# Patient Record
Sex: Female | Born: 1940 | Race: Asian | Hispanic: No | Marital: Married | Smoking: Never smoker
Health system: Southern US, Community
[De-identification: ages and names within clinical notes are randomized; demographics above are authoritative.]

## PROBLEM LIST (undated history)

## (undated) DIAGNOSIS — K250 Acute gastric ulcer with hemorrhage: Secondary | ICD-10-CM

## (undated) DIAGNOSIS — I480 Paroxysmal atrial fibrillation: Secondary | ICD-10-CM

## (undated) DIAGNOSIS — I214 Non-ST elevation (NSTEMI) myocardial infarction: Secondary | ICD-10-CM

---

## 2009-11-09 DIAGNOSIS — I214 Non-ST elevation (NSTEMI) myocardial infarction: Secondary | ICD-10-CM

## 2009-11-09 HISTORY — PX: ERCP: SHX60

## 2009-11-09 HISTORY — PX: PARTIAL COLECTOMY: SHX5273

## 2009-11-09 HISTORY — PX: COLOSTOMY: SHX63

## 2009-11-09 HISTORY — PX: CARDIAC CATHETERIZATION: SHX172

## 2009-11-09 HISTORY — DX: Non-ST elevation (NSTEMI) myocardial infarction: I21.4

## 2009-11-09 HISTORY — PX: LAPAROSCOPIC CHOLECYSTECTOMY: SUR755

## 2010-06-23 ENCOUNTER — Emergency Department (HOSPITAL_COMMUNITY): Admission: EM | Admit: 2010-06-23 | Discharge: 2010-06-23 | Payer: Self-pay | Admitting: Emergency Medicine

## 2010-06-23 ENCOUNTER — Ambulatory Visit: Payer: Self-pay | Admitting: Internal Medicine

## 2010-06-23 ENCOUNTER — Inpatient Hospital Stay (HOSPITAL_COMMUNITY): Admission: EM | Admit: 2010-06-23 | Discharge: 2010-06-30 | Payer: Self-pay | Admitting: Emergency Medicine

## 2010-06-25 ENCOUNTER — Encounter (INDEPENDENT_AMBULATORY_CARE_PROVIDER_SITE_OTHER): Payer: Self-pay | Admitting: Internal Medicine

## 2010-07-03 ENCOUNTER — Telehealth (INDEPENDENT_AMBULATORY_CARE_PROVIDER_SITE_OTHER): Payer: Self-pay | Admitting: *Deleted

## 2010-07-04 ENCOUNTER — Encounter: Payer: Self-pay | Admitting: Internal Medicine

## 2010-07-09 ENCOUNTER — Encounter: Payer: Self-pay | Admitting: Internal Medicine

## 2010-07-15 DIAGNOSIS — J45909 Unspecified asthma, uncomplicated: Secondary | ICD-10-CM | POA: Insufficient documentation

## 2010-07-16 ENCOUNTER — Encounter: Payer: Self-pay | Admitting: Internal Medicine

## 2010-07-17 ENCOUNTER — Ambulatory Visit: Payer: Self-pay | Admitting: Internal Medicine

## 2010-07-17 ENCOUNTER — Inpatient Hospital Stay (HOSPITAL_COMMUNITY): Admission: AD | Admit: 2010-07-17 | Discharge: 2010-07-21 | Payer: Self-pay | Admitting: Internal Medicine

## 2010-07-17 ENCOUNTER — Encounter: Payer: Self-pay | Admitting: Physician Assistant

## 2010-07-17 DIAGNOSIS — N183 Chronic kidney disease, stage 3 (moderate): Secondary | ICD-10-CM

## 2010-07-17 DIAGNOSIS — R0602 Shortness of breath: Secondary | ICD-10-CM

## 2010-07-17 DIAGNOSIS — I2510 Atherosclerotic heart disease of native coronary artery without angina pectoris: Secondary | ICD-10-CM

## 2010-07-17 DIAGNOSIS — E785 Hyperlipidemia, unspecified: Secondary | ICD-10-CM

## 2010-07-17 DIAGNOSIS — I4891 Unspecified atrial fibrillation: Secondary | ICD-10-CM

## 2010-07-22 ENCOUNTER — Telehealth: Payer: Self-pay | Admitting: Internal Medicine

## 2010-07-23 ENCOUNTER — Ambulatory Visit: Payer: Self-pay | Admitting: Physician Assistant

## 2010-07-23 DIAGNOSIS — I2589 Other forms of chronic ischemic heart disease: Secondary | ICD-10-CM

## 2010-07-23 DIAGNOSIS — R0609 Other forms of dyspnea: Secondary | ICD-10-CM

## 2010-07-23 DIAGNOSIS — D649 Anemia, unspecified: Secondary | ICD-10-CM | POA: Insufficient documentation

## 2010-07-23 DIAGNOSIS — R0989 Other specified symptoms and signs involving the circulatory and respiratory systems: Secondary | ICD-10-CM

## 2010-07-23 DIAGNOSIS — J189 Pneumonia, unspecified organism: Secondary | ICD-10-CM

## 2010-07-23 DIAGNOSIS — I10 Essential (primary) hypertension: Secondary | ICD-10-CM | POA: Insufficient documentation

## 2010-07-24 LAB — CONVERTED CEMR LAB
Basophils Relative: 0 % (ref 0–1)
Hemoglobin: 12.1 g/dL (ref 12.0–15.0)
Lymphocytes Relative: 28 % (ref 12–46)
MCHC: 31.5 g/dL (ref 30.0–36.0)
Monocytes Absolute: 0.5 10*3/uL (ref 0.1–1.0)
Monocytes Relative: 8 % (ref 3–12)
Neutro Abs: 4 10*3/uL (ref 1.7–7.7)
RBC: 4.33 M/uL (ref 3.87–5.11)
TIBC: 244 ug/dL — ABNORMAL LOW (ref 250–470)

## 2010-07-25 ENCOUNTER — Encounter (INDEPENDENT_AMBULATORY_CARE_PROVIDER_SITE_OTHER): Payer: Self-pay | Admitting: *Deleted

## 2010-07-30 ENCOUNTER — Encounter: Payer: Self-pay | Admitting: Internal Medicine

## 2010-09-13 ENCOUNTER — Inpatient Hospital Stay (HOSPITAL_COMMUNITY)
Admission: EM | Admit: 2010-09-13 | Discharge: 2010-12-23 | DRG: 411 | Disposition: A | Discharge status: Skilled Nursing Facility | Payer: Medicaid Other | Attending: Emergency Medicine | Admitting: Emergency Medicine

## 2010-09-13 DIAGNOSIS — R578 Other shock: Secondary | ICD-10-CM | POA: Diagnosis not present

## 2010-09-13 DIAGNOSIS — I4891 Unspecified atrial fibrillation: Secondary | ICD-10-CM | POA: Diagnosis present

## 2010-09-13 DIAGNOSIS — K921 Melena: Secondary | ICD-10-CM | POA: Diagnosis not present

## 2010-09-13 DIAGNOSIS — I10 Essential (primary) hypertension: Secondary | ICD-10-CM | POA: Diagnosis present

## 2010-09-13 DIAGNOSIS — K8050 Calculus of bile duct without cholangitis or cholecystitis without obstruction: Principal | ICD-10-CM | POA: Diagnosis present

## 2010-09-13 DIAGNOSIS — K631 Perforation of intestine (nontraumatic): Secondary | ICD-10-CM | POA: Diagnosis not present

## 2010-09-13 DIAGNOSIS — A419 Sepsis, unspecified organism: Secondary | ICD-10-CM | POA: Diagnosis not present

## 2010-09-13 DIAGNOSIS — Y9210 Unspecified residential institution as the place of occurrence of the external cause: Secondary | ICD-10-CM | POA: Diagnosis not present

## 2010-09-13 DIAGNOSIS — R197 Diarrhea, unspecified: Secondary | ICD-10-CM | POA: Diagnosis present

## 2010-09-13 DIAGNOSIS — E876 Hypokalemia: Secondary | ICD-10-CM | POA: Diagnosis present

## 2010-09-13 DIAGNOSIS — E43 Unspecified severe protein-calorie malnutrition: Secondary | ICD-10-CM | POA: Diagnosis not present

## 2010-09-13 DIAGNOSIS — D62 Acute posthemorrhagic anemia: Secondary | ICD-10-CM | POA: Diagnosis not present

## 2010-09-13 DIAGNOSIS — I252 Old myocardial infarction: Secondary | ICD-10-CM

## 2010-09-13 DIAGNOSIS — J9600 Acute respiratory failure, unspecified whether with hypoxia or hypercapnia: Secondary | ICD-10-CM | POA: Diagnosis not present

## 2010-09-13 DIAGNOSIS — J441 Chronic obstructive pulmonary disease with (acute) exacerbation: Secondary | ICD-10-CM | POA: Diagnosis present

## 2010-09-13 DIAGNOSIS — Z7902 Long term (current) use of antithrombotics/antiplatelets: Secondary | ICD-10-CM

## 2010-09-13 DIAGNOSIS — E871 Hypo-osmolality and hyponatremia: Secondary | ICD-10-CM | POA: Diagnosis not present

## 2010-09-13 DIAGNOSIS — Z7982 Long term (current) use of aspirin: Secondary | ICD-10-CM

## 2010-09-13 DIAGNOSIS — K219 Gastro-esophageal reflux disease without esophagitis: Secondary | ICD-10-CM | POA: Diagnosis present

## 2010-09-13 DIAGNOSIS — IMO0002 Reserved for concepts with insufficient information to code with codable children: Secondary | ICD-10-CM | POA: Diagnosis not present

## 2010-09-13 DIAGNOSIS — K560 Paralytic ileus: Secondary | ICD-10-CM | POA: Diagnosis not present

## 2010-09-13 DIAGNOSIS — E872 Acidosis, unspecified: Secondary | ICD-10-CM | POA: Diagnosis not present

## 2010-09-13 DIAGNOSIS — N390 Urinary tract infection, site not specified: Secondary | ICD-10-CM | POA: Diagnosis not present

## 2010-09-13 DIAGNOSIS — J9819 Other pulmonary collapse: Secondary | ICD-10-CM | POA: Diagnosis not present

## 2010-09-13 DIAGNOSIS — R5381 Other malaise: Secondary | ICD-10-CM | POA: Diagnosis not present

## 2010-09-13 DIAGNOSIS — N179 Acute kidney failure, unspecified: Secondary | ICD-10-CM | POA: Diagnosis not present

## 2010-09-13 DIAGNOSIS — E785 Hyperlipidemia, unspecified: Secondary | ICD-10-CM | POA: Diagnosis present

## 2010-09-13 DIAGNOSIS — D72829 Elevated white blood cell count, unspecified: Secondary | ICD-10-CM | POA: Diagnosis present

## 2010-09-13 DIAGNOSIS — K7200 Acute and subacute hepatic failure without coma: Secondary | ICD-10-CM | POA: Diagnosis present

## 2010-09-13 DIAGNOSIS — I2510 Atherosclerotic heart disease of native coronary artery without angina pectoris: Secondary | ICD-10-CM | POA: Diagnosis present

## 2010-09-13 DIAGNOSIS — E875 Hyperkalemia: Secondary | ICD-10-CM | POA: Diagnosis present

## 2010-09-15 ENCOUNTER — Encounter (INDEPENDENT_AMBULATORY_CARE_PROVIDER_SITE_OTHER): Payer: Self-pay | Admitting: Internal Medicine

## 2010-09-16 ENCOUNTER — Encounter: Payer: Self-pay | Admitting: Gastroenterology

## 2010-09-16 ENCOUNTER — Ambulatory Visit: Payer: Self-pay | Admitting: Internal Medicine

## 2010-09-17 ENCOUNTER — Encounter (INDEPENDENT_AMBULATORY_CARE_PROVIDER_SITE_OTHER): Payer: Self-pay | Admitting: Pulmonary Disease

## 2010-10-06 ENCOUNTER — Encounter (INDEPENDENT_AMBULATORY_CARE_PROVIDER_SITE_OTHER): Payer: Self-pay | Admitting: Internal Medicine

## 2010-10-09 ENCOUNTER — Encounter (INDEPENDENT_AMBULATORY_CARE_PROVIDER_SITE_OTHER): Payer: Self-pay | Admitting: *Deleted

## 2010-11-12 LAB — GLUCOSE, CAPILLARY
Glucose-Capillary: 100 mg/dL — ABNORMAL HIGH (ref 70–99)
Glucose-Capillary: 106 mg/dL — ABNORMAL HIGH (ref 70–99)
Glucose-Capillary: 159 mg/dL — ABNORMAL HIGH (ref 70–99)

## 2010-11-13 LAB — COMPREHENSIVE METABOLIC PANEL
ALT: 27 U/L (ref 0–35)
AST: 35 U/L (ref 0–37)
Albumin: 1.9 g/dL — ABNORMAL LOW (ref 3.5–5.2)
Alkaline Phosphatase: 148 U/L — ABNORMAL HIGH (ref 39–117)
BUN: 26 mg/dL — ABNORMAL HIGH (ref 6–23)
CO2: 23 mEq/L (ref 19–32)
Calcium: 8.7 mg/dL (ref 8.4–10.5)
Chloride: 106 mEq/L (ref 96–112)
Creatinine, Ser: 0.76 mg/dL (ref 0.4–1.2)
GFR calc Af Amer: >60 mL/min (ref >60)
GFR calc non Af Amer: >60 mL/min (ref >60)
Glucose, Bld: 132 mg/dL — ABNORMAL HIGH (ref 70–99)
Potassium: 4.1 mEq/L (ref 3.5–5.1)
Sodium: 134 mEq/L — ABNORMAL LOW (ref 135–145)
Total Bilirubin: 0.5 mg/dL (ref 0.3–1.2)
Total Protein: 5.8 g/dL — ABNORMAL LOW (ref 6.0–8.3)

## 2010-11-13 LAB — GLUCOSE, CAPILLARY
Glucose-Capillary: 113 mg/dL — ABNORMAL HIGH (ref 70–99)
Glucose-Capillary: 102 mg/dL — ABNORMAL HIGH (ref 70–99)

## 2010-11-13 LAB — MAGNESIUM: Magnesium: 1.6 mg/dL (ref 1.5–2.5)

## 2010-11-13 LAB — PHOSPHORUS: Phosphorus: 4.7 mg/dL — ABNORMAL HIGH (ref 2.3–4.6)

## 2010-11-13 LAB — PREALBUMIN: Prealbumin: 11 mg/dL — ABNORMAL LOW (ref 17.0–34.0)

## 2010-11-14 LAB — GLUCOSE, CAPILLARY
Glucose-Capillary: 148 mg/dL — ABNORMAL HIGH (ref 70–99)
Glucose-Capillary: 127 mg/dL — ABNORMAL HIGH (ref 70–99)

## 2010-11-24 LAB — GLUCOSE, CAPILLARY
Glucose-Capillary: 147 mg/dL — ABNORMAL HIGH (ref 70–99)
Glucose-Capillary: 98 mg/dL (ref 70–99)
Glucose-Capillary: 122 mg/dL — ABNORMAL HIGH (ref 70–99)
Glucose-Capillary: 97 mg/dL (ref 70–99)
Glucose-Capillary: 101 mg/dL — ABNORMAL HIGH (ref 70–99)
Glucose-Capillary: 139 mg/dL — ABNORMAL HIGH (ref 70–99)
Glucose-Capillary: 95 mg/dL (ref 70–99)
Glucose-Capillary: 101 mg/dL — ABNORMAL HIGH (ref 70–99)
Glucose-Capillary: 103 mg/dL — ABNORMAL HIGH (ref 70–99)
Glucose-Capillary: 114 mg/dL — ABNORMAL HIGH (ref 70–99)
Glucose-Capillary: 131 mg/dL — ABNORMAL HIGH (ref 70–99)
Glucose-Capillary: 144 mg/dL — ABNORMAL HIGH (ref 70–99)
Glucose-Capillary: 96 mg/dL (ref 70–99)
Glucose-Capillary: 101 mg/dL — ABNORMAL HIGH (ref 70–99)
Glucose-Capillary: 110 mg/dL — ABNORMAL HIGH (ref 70–99)
Glucose-Capillary: 118 mg/dL — ABNORMAL HIGH (ref 70–99)
Glucose-Capillary: 126 mg/dL — ABNORMAL HIGH (ref 70–99)
Glucose-Capillary: 158 mg/dL — ABNORMAL HIGH (ref 70–99)
Glucose-Capillary: 98 mg/dL (ref 70–99)
Glucose-Capillary: 107 mg/dL — ABNORMAL HIGH (ref 70–99)
Glucose-Capillary: 130 mg/dL — ABNORMAL HIGH (ref 70–99)
Glucose-Capillary: 136 mg/dL — ABNORMAL HIGH (ref 70–99)
Glucose-Capillary: 85 mg/dL (ref 70–99)
Glucose-Capillary: 88 mg/dL (ref 70–99)
Glucose-Capillary: 90 mg/dL (ref 70–99)
Glucose-Capillary: 91 mg/dL (ref 70–99)
Glucose-Capillary: 132 mg/dL — ABNORMAL HIGH (ref 70–99)
Glucose-Capillary: 91 mg/dL (ref 70–99)
Glucose-Capillary: 98 mg/dL (ref 70–99)

## 2010-11-24 LAB — COMPREHENSIVE METABOLIC PANEL
ALT: 31 U/L (ref 0–35)
ALT: 27 U/L (ref 0–35)
ALT: 30 U/L (ref 0–35)
AST: 39 U/L — ABNORMAL HIGH (ref 0–37)
AST: 36 U/L (ref 0–37)
AST: 37 U/L (ref 0–37)
Albumin: 2.1 g/dL — ABNORMAL LOW (ref 3.5–5.2)
Albumin: 2.1 g/dL — ABNORMAL LOW (ref 3.5–5.2)
Albumin: 2.4 g/dL — ABNORMAL LOW (ref 3.5–5.2)
Alkaline Phosphatase: 138 U/L — ABNORMAL HIGH (ref 39–117)
Alkaline Phosphatase: 144 U/L — ABNORMAL HIGH (ref 39–117)
Alkaline Phosphatase: 147 U/L — ABNORMAL HIGH (ref 39–117)
BUN: 28 mg/dL — ABNORMAL HIGH (ref 6–23)
BUN: 23 mg/dL (ref 6–23)
BUN: 23 mg/dL (ref 6–23)
CO2: 21 mEq/L (ref 19–32)
CO2: 20 mEq/L (ref 19–32)
CO2: 21 mEq/L (ref 19–32)
Calcium: 8.4 mg/dL (ref 8.4–10.5)
Calcium: 8.5 mg/dL (ref 8.4–10.5)
Calcium: 8.9 mg/dL (ref 8.4–10.5)
Chloride: 107 mEq/L (ref 96–112)
Chloride: 105 mEq/L (ref 96–112)
Chloride: 105 mEq/L (ref 96–112)
Creatinine, Ser: 0.8 mg/dL (ref 0.4–1.2)
Creatinine, Ser: 0.79 mg/dL (ref 0.4–1.2)
Creatinine, Ser: 0.78 mg/dL (ref 0.4–1.2)
GFR calc Af Amer: >60 mL/min (ref >60)
GFR calc Af Amer: >60 mL/min (ref >60)
GFR calc Af Amer: >60 mL/min (ref >60)
GFR calc non Af Amer: >60 mL/min (ref >60)
GFR calc non Af Amer: >60 mL/min (ref >60)
GFR calc non Af Amer: >60 mL/min (ref >60)
Glucose, Bld: 117 mg/dL — ABNORMAL HIGH (ref 70–99)
Glucose, Bld: 133 mg/dL — ABNORMAL HIGH (ref 70–99)
Glucose, Bld: 117 mg/dL — ABNORMAL HIGH (ref 70–99)
Potassium: 3.8 mEq/L (ref 3.5–5.1)
Potassium: 3.3 mEq/L — ABNORMAL LOW (ref 3.5–5.1)
Potassium: 3.7 mEq/L (ref 3.5–5.1)
Sodium: 131 mEq/L — ABNORMAL LOW (ref 135–145)
Sodium: 132 mEq/L — ABNORMAL LOW (ref 135–145)
Sodium: 132 mEq/L — ABNORMAL LOW (ref 135–145)
Total Bilirubin: 0.6 mg/dL (ref 0.3–1.2)
Total Bilirubin: 0.5 mg/dL (ref 0.3–1.2)
Total Bilirubin: 0.8 mg/dL (ref 0.3–1.2)
Total Protein: 5.9 g/dL — ABNORMAL LOW (ref 6.0–8.3)
Total Protein: 6.3 g/dL (ref 6.0–8.3)
Total Protein: 6.8 g/dL (ref 6.0–8.3)

## 2010-11-24 LAB — CBC
HCT: 26.5 % — ABNORMAL LOW (ref 36.0–46.0)
HCT: 26.9 % — ABNORMAL LOW (ref 36.0–46.0)
Hemoglobin: 8.6 g/dL — ABNORMAL LOW (ref 12.0–15.0)
Hemoglobin: 9 g/dL — ABNORMAL LOW (ref 12.0–15.0)
MCH: 28.8 pg (ref 26.0–34.0)
MCH: 29.6 pg (ref 26.0–34.0)
MCHC: 32.5 g/dL (ref 30.0–36.0)
MCHC: 33.5 g/dL (ref 30.0–36.0)
MCV: 88.6 fL (ref 78.0–100.0)
MCV: 88.5 fL (ref 78.0–100.0)
Platelets: 227 10*3/uL (ref 150–400)
Platelets: 268 10*3/uL (ref 150–400)
RBC: 2.99 MIL/uL — ABNORMAL LOW (ref 3.87–5.11)
RBC: 3.04 MIL/uL — ABNORMAL LOW (ref 3.87–5.11)
RDW: 15.6 % — ABNORMAL HIGH (ref 11.5–15.5)
RDW: 15.8 % — ABNORMAL HIGH (ref 11.5–15.5)
WBC: 6 10*3/uL (ref 4.0–10.5)
WBC: 6.4 10*3/uL (ref 4.0–10.5)

## 2010-11-24 LAB — CHOLESTEROL, TOTAL: Cholesterol: 107 mg/dL (ref 0–200)

## 2010-11-24 LAB — MAGNESIUM
Magnesium: 1.6 mg/dL (ref 1.5–2.5)
Magnesium: 1.3 mg/dL — ABNORMAL LOW (ref 1.5–2.5)
Magnesium: 1.9 mg/dL (ref 1.5–2.5)
Magnesium: 1.7 mg/dL (ref 1.5–2.5)
Magnesium: 1.9 mg/dL (ref 1.5–2.5)

## 2010-11-24 LAB — BASIC METABOLIC PANEL
BUN: 26 mg/dL — ABNORMAL HIGH (ref 6–23)
BUN: 26 mg/dL — ABNORMAL HIGH (ref 6–23)
CO2: 22 mEq/L (ref 19–32)
CO2: 21 mEq/L (ref 19–32)
Calcium: 8.9 mg/dL (ref 8.4–10.5)
Calcium: 8.5 mg/dL (ref 8.4–10.5)
Chloride: 104 mEq/L (ref 96–112)
Chloride: 108 mEq/L (ref 96–112)
Creatinine, Ser: 0.78 mg/dL (ref 0.4–1.2)
Creatinine, Ser: 0.82 mg/dL (ref 0.4–1.2)
GFR calc Af Amer: >60 mL/min (ref >60)
GFR calc Af Amer: >60 mL/min (ref >60)
GFR calc non Af Amer: >60 mL/min (ref >60)
GFR calc non Af Amer: >60 mL/min (ref >60)
Glucose, Bld: 106 mg/dL — ABNORMAL HIGH (ref 70–99)
Glucose, Bld: 130 mg/dL — ABNORMAL HIGH (ref 70–99)
Potassium: 4.3 mEq/L (ref 3.5–5.1)
Potassium: 3.6 mEq/L (ref 3.5–5.1)
Sodium: 130 mEq/L — ABNORMAL LOW (ref 135–145)
Sodium: 133 mEq/L — ABNORMAL LOW (ref 135–145)

## 2010-11-24 LAB — TRIGLYCERIDES: Triglycerides: 103 mg/dL (ref <150)

## 2010-11-24 LAB — PHOSPHORUS
Phosphorus: 5.4 mg/dL — ABNORMAL HIGH (ref 2.3–4.6)
Phosphorus: 3.2 mg/dL (ref 2.3–4.6)
Phosphorus: 4.1 mg/dL (ref 2.3–4.6)
Phosphorus: 3.5 mg/dL (ref 2.3–4.6)

## 2010-11-24 LAB — DIFFERENTIAL
Basophils Absolute: 0.1 10*3/uL (ref 0.0–0.1)
Basophils Relative: 1 % (ref 0–1)
Eosinophils Absolute: 0.3 10*3/uL (ref 0.0–0.7)
Eosinophils Relative: 5 % (ref 0–5)
Lymphocytes Relative: 28 % (ref 12–46)
Lymphs Abs: 1.7 10*3/uL (ref 0.7–4.0)
Monocytes Absolute: 0.7 10*3/uL (ref 0.1–1.0)
Monocytes Relative: 11 % (ref 3–12)
Neutro Abs: 3.3 10*3/uL (ref 1.7–7.7)
Neutrophils Relative %: 54 % (ref 43–77)

## 2010-11-24 LAB — PREALBUMIN: Prealbumin: 12 mg/dL — ABNORMAL LOW (ref 17.0–34.0)

## 2010-11-26 LAB — CHOLESTEROL, TOTAL: Cholesterol: 140 mg/dL (ref 0–200)

## 2010-11-26 LAB — DIFFERENTIAL
Basophils Absolute: 0.1 10*3/uL (ref 0.0–0.1)
Basophils Relative: 1 % (ref 0–1)
Eosinophils Absolute: 0.4 10*3/uL (ref 0.0–0.7)
Eosinophils Relative: 4 % (ref 0–5)
Lymphocytes Relative: 21 % (ref 12–46)
Lymphs Abs: 2.3 10*3/uL (ref 0.7–4.0)
Monocytes Absolute: 0.9 10*3/uL (ref 0.1–1.0)
Monocytes Relative: 8 % (ref 3–12)
Neutro Abs: 7.1 10*3/uL (ref 1.7–7.7)
Neutrophils Relative %: 66 % (ref 43–77)

## 2010-11-26 LAB — CBC
HCT: 30.5 % — ABNORMAL LOW (ref 36.0–46.0)
Hemoglobin: 10.2 g/dL — ABNORMAL LOW (ref 12.0–15.0)
MCH: 29 pg (ref 26.0–34.0)
MCHC: 33.4 g/dL (ref 30.0–36.0)
MCV: 86.6 fL (ref 78.0–100.0)
Platelets: 258 10*3/uL (ref 150–400)
RBC: 3.52 MIL/uL — ABNORMAL LOW (ref 3.87–5.11)
RDW: 16 % — ABNORMAL HIGH (ref 11.5–15.5)
WBC: 10.7 10*3/uL — ABNORMAL HIGH (ref 4.0–10.5)

## 2010-11-26 LAB — GLUCOSE, CAPILLARY
Glucose-Capillary: 103 mg/dL — ABNORMAL HIGH (ref 70–99)
Glucose-Capillary: 104 mg/dL — ABNORMAL HIGH (ref 70–99)
Glucose-Capillary: 129 mg/dL — ABNORMAL HIGH (ref 70–99)
Glucose-Capillary: 95 mg/dL (ref 70–99)
Glucose-Capillary: 96 mg/dL (ref 70–99)
Glucose-Capillary: 113 mg/dL — ABNORMAL HIGH (ref 70–99)
Glucose-Capillary: 138 mg/dL — ABNORMAL HIGH (ref 70–99)
Glucose-Capillary: 141 mg/dL — ABNORMAL HIGH (ref 70–99)
Glucose-Capillary: 87 mg/dL (ref 70–99)

## 2010-11-26 LAB — COMPREHENSIVE METABOLIC PANEL
ALT: 58 U/L — ABNORMAL HIGH (ref 0–35)
AST: 60 U/L — ABNORMAL HIGH (ref 0–37)
Albumin: 2.6 g/dL — ABNORMAL LOW (ref 3.5–5.2)
Alkaline Phosphatase: 150 U/L — ABNORMAL HIGH (ref 39–117)
BUN: 32 mg/dL — ABNORMAL HIGH (ref 6–23)
CO2: 20 mEq/L (ref 19–32)
Calcium: 8.8 mg/dL (ref 8.4–10.5)
Chloride: 104 mEq/L (ref 96–112)
Creatinine, Ser: 0.8 mg/dL (ref 0.4–1.2)
GFR calc Af Amer: >60 mL/min (ref >60)
GFR calc non Af Amer: >60 mL/min (ref >60)
Glucose, Bld: 101 mg/dL — ABNORMAL HIGH (ref 70–99)
Potassium: 4.6 mEq/L (ref 3.5–5.1)
Sodium: 130 mEq/L — ABNORMAL LOW (ref 135–145)
Total Bilirubin: 0.8 mg/dL (ref 0.3–1.2)
Total Protein: 6.8 g/dL (ref 6.0–8.3)

## 2010-11-26 LAB — TRIGLYCERIDES: Triglycerides: 72 mg/dL (ref <150)

## 2010-11-26 LAB — MAGNESIUM: Magnesium: 1.8 mg/dL (ref 1.5–2.5)

## 2010-11-26 LAB — PREALBUMIN: Prealbumin: 16 mg/dL — ABNORMAL LOW (ref 17.0–34.0)

## 2010-11-26 LAB — PHOSPHORUS: Phosphorus: 4.5 mg/dL (ref 2.3–4.6)

## 2010-11-27 NOTE — Progress Notes (Signed)
  NAMEFATEMA, [name] NO.:  [id number]  MEDICAL RECORD NO.:  [id number]           PATIENT TYPE:  LOCATION:                                 FACILITY:  PHYSICIAN:  [name], Ovadia Lopp    DATE OF BIRTH:  Jun 06, 1941                                PROGRESS NOTE   This progress note covers activities from November 12, 2010, to November 18, 2010.  Please note that this is progress note for the medical service. Surgical issues are being handled by Surgery.  THE PATIENT'S CURRENT ONGOING MEDICAL ISSUES: 1. Hypertension, controlled. 2. History of bronchial asthma, stable. 3. Presumed gastroesophageal reflux disease, also stable. 4. The patient has a history of paroxysmal atrial fibrillation and is     maintained on IV metoprolol. 5. Anemia which is secondary to critical illness is also stable. 6. The patient has a history of non-STEMI as well. 7. Hyponatremia, intermittent, stable.  ONGOING SURGICAL ISSUES: 1. Colocutaneous fistula and a duodenocutaneous fistula. 2. The patient is planned to be a continued on TNA and a prolonged     n.p.o. status allowing for these fistulas to heal.  BRIEF HOSPITAL COURSE: 1. Hypertension is stable.  The plans are to continue IV metoprolol     12.5 mg every 6 hours. 2. History of paroxysmal atrial fibrillation.  The patient is     maintained on metoprolol.  She is currently not a candidate for any     long-term anticoagulation given her numerous surgical issues. 3. Bronchial asthma is stable.  She is on as needed nebulized     bronchodilators. 4. Surgical issues.  The patient has had a prolonged hospitalization     primarily related to numerous surgical issues.  For details     regarding these issues, please see the previously dictated     discharge summaries that are available in E-chart.  The patient     currently is as noted above on a prolonged n.p.o. status and is     receiving TNA.  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  The     patient is status post repair of her duodenal perforation along     with bile duct exploration and T-tube placement.  She is also     status post cholecystectomy.  For her colocutaneous fistula, she is     status post colectomy and a temporary ileostomy as well.  DISPOSITION: The patient will continue requiring hospitalization for her current surgical issues.  Per the surgical note as of today there may be a question of whether this patient can be transferred to a skilled nursing facility; however, the patient continues to use PCA morphine and that will need to be discontinued first before transfer happens.  Medical service will continue to follow along as needed.     [name], Jahrel Borthwick     SG/MEDQ  D:  11/18/2010  T:  11/19/2010  Job:  [id number]  Electronically Signed by [name]  on 11/27/2010 04:43:30 PM

## 2010-11-28 NOTE — Progress Notes (Signed)
  [name], [name]                 ACCOUNT NO.:  [id number]  MEDICAL RECORD NO.:  [id number]           PATIENT TYPE:  LOCATION:                                 FACILITY:  PHYSICIAN:  [name], [name]       DATE OF BIRTH:  [birth year]                                PROGRESS NOTE   This progressive note covers from November 19, 2010 to November 25, 2010.  PRIMARY TEAM: Surgery.  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  The patient is seen and examined.  Her vitals today included a temperature of 97.9, pulse of 98, respirations 16, blood pressure of 132/75.  Saturating 100% on room air.  Her labs includes a CBC from yesterday showing a WBC count of 10.7, hemoglobin of 10.2, hematocrit of 30.5, platelets of 258.  CMP shows a sodium of 130, glucose of 101, potassium of 4.6, chloride of 104, bicarb of 20, BUN of 32, creatinine of 0.8 with alkaline phos of 150.  AST of 60, ALT of 58, phosphorus of 4.5, magnesium of 1.8, prealbumin of 16.  No recent radiological examination done.  PHYSICAL EXAMINATION: VITAL SIGNS:  She is alert, afebrile. GENERAL:  Lying in bed comfortable.  She denies any complaints at this time. HEART:  S1-S2 heard. LUNGS:  Good air entry bilaterally. ABDOMEN:  Mildly tender over the areas of fistula with colostomy pouches. EXTREMITIES:  No pedal edema.  ASSESSMENT AND PLAN: 1. The patient's medical issues include hypertension, which is     controlled with metoprolol 2.5 mg IV q.6 hours. 2. Bronchial asthma, controlled and stable on Xopenex 1.25 mg q.6     hours and Atrovent 0.5 mg inhalation q.4 hours p.r.n., Ventolin 2     puffs inhalation q.4 hours p.r.n. and Flovent inhalation b.i.d. 3. Gastroesophageal  reflux disease, stable on Protonix 40 mg daily. 4. Paroxysmal atrial fibrillation, rate controlled and maintained on     IV metoprolol. 5. Anemia, chronic and stable, normocytic, most likely secondary to     her chronic illnesses, but stable. 6. Hyponatremia Chronic mental status at baseline and stable. 8. Colocutaneous fistula and duodenal cutaneous fistula.  The patient     is being treated for the above by the surgery team and is on TNA     and n.p.o. allowing for these fistula to heal.  DISPOSITION: The patient will continue requiring hospitalization for her surgical tissues.  Medical service is being consulted for her medical issues.  We will continue to follow as long as needed.          ______________________________ [name], [name]     VA/MEDQ  D:  11/25/2010  T:  11/25/2010  Job:  [id number]  Electronically Signed by [name] [name] on 11/28/2010 11:32:21 AM

## 2010-12-01 LAB — GLUCOSE, CAPILLARY
Glucose-Capillary: 115 mg/dL — ABNORMAL HIGH (ref 70–99)
Glucose-Capillary: 127 mg/dL — ABNORMAL HIGH (ref 70–99)
Glucose-Capillary: 117 mg/dL — ABNORMAL HIGH (ref 70–99)
Glucose-Capillary: 116 mg/dL — ABNORMAL HIGH (ref 70–99)

## 2010-12-01 LAB — COMPREHENSIVE METABOLIC PANEL
ALT: 39 U/L — ABNORMAL HIGH (ref 0–35)
AST: 38 U/L — ABNORMAL HIGH (ref 0–37)
Albumin: 2.4 g/dL — ABNORMAL LOW (ref 3.5–5.2)
Alkaline Phosphatase: 127 U/L — ABNORMAL HIGH (ref 39–117)
CO2: 20 mEq/L (ref 19–32)
Calcium: 8.6 mg/dL (ref 8.4–10.5)
GFR calc non Af Amer: 57 mL/min — ABNORMAL LOW (ref >60)
Glucose, Bld: 118 mg/dL — ABNORMAL HIGH (ref 70–99)
Total Protein: 6.4 g/dL (ref 6.0–8.3)

## 2010-12-02 LAB — GLUCOSE, CAPILLARY
Glucose-Capillary: 146 mg/dL — ABNORMAL HIGH (ref 70–99)
Glucose-Capillary: 134 mg/dL — ABNORMAL HIGH (ref 70–99)
Glucose-Capillary: 126 mg/dL — ABNORMAL HIGH (ref 70–99)
Glucose-Capillary: 148 mg/dL — ABNORMAL HIGH (ref 70–99)
Glucose-Capillary: 97 mg/dL (ref 70–99)

## 2010-12-02 LAB — CBC
HCT: 28.8 % — ABNORMAL LOW (ref 36.0–46.0)
MCH: 29 pg (ref 26.0–34.0)
MCV: 86.5 fL (ref 78.0–100.0)
Platelets: 262 10*3/uL (ref 150–400)
RBC: 3.31 MIL/uL — ABNORMAL LOW (ref 3.87–5.11)
RDW: 15.7 % — ABNORMAL HIGH (ref 11.5–15.5)
RDW: 15.9 % — ABNORMAL HIGH (ref 11.5–15.5)
WBC: 8.1 10*3/uL (ref 4.0–10.5)
WBC: 8.9 10*3/uL (ref 4.0–10.5)

## 2010-12-02 LAB — COMPREHENSIVE METABOLIC PANEL
Albumin: 2.4 g/dL — ABNORMAL LOW (ref 3.5–5.2)
Alkaline Phosphatase: 134 U/L — ABNORMAL HIGH (ref 39–117)
Alkaline Phosphatase: 134 U/L — ABNORMAL HIGH (ref 39–117)
BUN: 26 mg/dL — ABNORMAL HIGH (ref 6–23)
BUN: 27 mg/dL — ABNORMAL HIGH (ref 6–23)
CO2: 21 mEq/L (ref 19–32)
Calcium: 9 mg/dL (ref 8.4–10.5)
Chloride: 104 mEq/L (ref 96–112)
GFR calc non Af Amer: >60 mL/min (ref >60)
Glucose, Bld: 137 mg/dL — ABNORMAL HIGH (ref 70–99)
Glucose, Bld: 107 mg/dL — ABNORMAL HIGH (ref 70–99)
Potassium: 4 mEq/L (ref 3.5–5.1)
Potassium: 4.2 mEq/L (ref 3.5–5.1)
Total Bilirubin: 0.6 mg/dL (ref 0.3–1.2)
Total Protein: 6.5 g/dL (ref 6.0–8.3)

## 2010-12-02 LAB — DIFFERENTIAL
Basophils Relative: 1 % (ref 0–1)
Eosinophils Absolute: 0.4 10*3/uL (ref 0.0–0.7)
Eosinophils Relative: 5 % (ref 0–5)
Monocytes Relative: 8 % (ref 3–12)
Neutrophils Relative %: 66 % (ref 43–77)

## 2010-12-02 LAB — PHOSPHORUS: Phosphorus: 4.8 mg/dL — ABNORMAL HIGH (ref 2.3–4.6)

## 2010-12-02 LAB — CHOLESTEROL, TOTAL: Cholesterol: 113 mg/dL (ref 0–200)

## 2010-12-02 LAB — MAGNESIUM: Magnesium: 1.7 mg/dL (ref 1.5–2.5)

## 2010-12-02 LAB — PREALBUMIN: Prealbumin: 14 mg/dL — ABNORMAL LOW (ref 17.0–34.0)

## 2010-12-02 LAB — TRIGLYCERIDES: Triglycerides: 117 mg/dL (ref <150)

## 2010-12-03 LAB — BASIC METABOLIC PANEL
CO2: 22 mEq/L (ref 19–32)
Calcium: 8.8 mg/dL (ref 8.4–10.5)
Creatinine, Ser: 0.85 mg/dL (ref 0.4–1.2)
GFR calc Af Amer: >60 mL/min (ref >60)
Glucose, Bld: 116 mg/dL — ABNORMAL HIGH (ref 70–99)

## 2010-12-03 LAB — GLUCOSE, CAPILLARY
Glucose-Capillary: 137 mg/dL — ABNORMAL HIGH (ref 70–99)
Glucose-Capillary: 134 mg/dL — ABNORMAL HIGH (ref 70–99)
Glucose-Capillary: 101 mg/dL — ABNORMAL HIGH (ref 70–99)

## 2010-12-04 LAB — MAGNESIUM: Magnesium: 1.7 mg/dL (ref 1.5–2.5)

## 2010-12-04 LAB — COMPREHENSIVE METABOLIC PANEL
ALT: 47 U/L — ABNORMAL HIGH (ref 0–35)
BUN: 27 mg/dL — ABNORMAL HIGH (ref 6–23)
CO2: 21 mEq/L (ref 19–32)
Calcium: 8.9 mg/dL (ref 8.4–10.5)
Creatinine, Ser: 0.83 mg/dL (ref 0.4–1.2)
GFR calc non Af Amer: >60 mL/min (ref >60)
Glucose, Bld: 124 mg/dL — ABNORMAL HIGH (ref 70–99)

## 2010-12-04 LAB — GLUCOSE, CAPILLARY
Glucose-Capillary: 147 mg/dL — ABNORMAL HIGH (ref 70–99)
Glucose-Capillary: 118 mg/dL — ABNORMAL HIGH (ref 70–99)

## 2010-12-04 LAB — PHOSPHORUS: Phosphorus: 4.8 mg/dL — ABNORMAL HIGH (ref 2.3–4.6)

## 2010-12-05 LAB — GLUCOSE, CAPILLARY
Glucose-Capillary: 112 mg/dL — ABNORMAL HIGH (ref 70–99)
Glucose-Capillary: 94 mg/dL (ref 70–99)

## 2010-12-06 LAB — GLUCOSE, CAPILLARY
Glucose-Capillary: 113 mg/dL — ABNORMAL HIGH (ref 70–99)
Glucose-Capillary: 163 mg/dL — ABNORMAL HIGH (ref 70–99)

## 2010-12-07 LAB — GLUCOSE, CAPILLARY: Glucose-Capillary: 135 mg/dL — ABNORMAL HIGH (ref 70–99)

## 2010-12-08 LAB — CBC
Hemoglobin: 9.5 g/dL — ABNORMAL LOW (ref 12.0–15.0)
MCH: 28.8 pg (ref 26.0–34.0)
MCHC: 32.9 g/dL (ref 30.0–36.0)
MCV: 87.6 fL (ref 78.0–100.0)
Platelets: 278 10*3/uL (ref 150–400)
RBC: 3.3 MIL/uL — ABNORMAL LOW (ref 3.87–5.11)

## 2010-12-08 LAB — DIFFERENTIAL
Eosinophils Absolute: 0.4 10*3/uL (ref 0.0–0.7)
Lymphs Abs: 1.8 10*3/uL (ref 0.7–4.0)
Monocytes Absolute: 0.6 10*3/uL (ref 0.1–1.0)
Monocytes Relative: 9 % (ref 3–12)
Neutrophils Relative %: 56 % (ref 43–77)

## 2010-12-08 LAB — COMPREHENSIVE METABOLIC PANEL
Alkaline Phosphatase: 136 U/L — ABNORMAL HIGH (ref 39–117)
BUN: 32 mg/dL — ABNORMAL HIGH (ref 6–23)
Chloride: 104 mEq/L (ref 96–112)
Glucose, Bld: 102 mg/dL — ABNORMAL HIGH (ref 70–99)
Potassium: 4.6 mEq/L (ref 3.5–5.1)
Total Bilirubin: 0.6 mg/dL (ref 0.3–1.2)

## 2010-12-08 LAB — PHOSPHORUS: Phosphorus: 5.4 mg/dL — ABNORMAL HIGH (ref 2.3–4.6)

## 2010-12-08 LAB — MAGNESIUM: Magnesium: 1.6 mg/dL (ref 1.5–2.5)

## 2010-12-08 LAB — GLUCOSE, CAPILLARY: Glucose-Capillary: 91 mg/dL (ref 70–99)

## 2010-12-09 LAB — BASIC METABOLIC PANEL
Calcium: 8.5 mg/dL (ref 8.4–10.5)
Creatinine, Ser: 0.88 mg/dL (ref 0.4–1.2)
GFR calc Af Amer: >60 mL/min (ref >60)
GFR calc non Af Amer: >60 mL/min (ref >60)

## 2010-12-09 LAB — GLUCOSE, CAPILLARY
Glucose-Capillary: 114 mg/dL — ABNORMAL HIGH (ref 70–99)
Glucose-Capillary: 155 mg/dL — ABNORMAL HIGH (ref 70–99)

## 2010-12-09 LAB — MAGNESIUM: Magnesium: 2.1 mg/dL (ref 1.5–2.5)

## 2010-12-09 LAB — PHOSPHORUS: Phosphorus: 3.1 mg/dL (ref 2.3–4.6)

## 2010-12-09 NOTE — Progress Notes (Signed)
Summary: sob  Phone Note Other Incoming   CallerArline Asp w/[facility] [phone] Summary of Call: Arline Asp called around 2:30pm when I was in breakroom w/no computer to report pt was SOB, O2 sat 99%, advised I had no computer and wasn't familiar w/pt I would call her back in a few min.  Have attempt to call Cindy back 2 times starting around 3pm and have left a mess, she has not called back, pt is sch to see Tereso Newcomer at [facility] on 9/14 Initial call taken by: Meredith Staggers, RN,  July 22, 2010 3:46 PM  Follow-up for Phone Call        issue addressed by Tereso Newcomer on 9/14 Follow-up by: Meredith Staggers, RN,  July 24, 2010 11:35 AM

## 2010-12-09 NOTE — Miscellaneous (Signed)
Summary: [facility] Orders   [facility] Orders   Imported By: Roderic Ovens 07/15/2010 14:26:09  _____________________________________________________________________  External Attachment:    Type:   Image     Comment:   External Document

## 2010-12-09 NOTE — Miscellaneous (Signed)
Summary: [facility] Communication Orders   [facility] Communication Orders   Imported By: Roderic Ovens 07/15/2010 15:29:57  _____________________________________________________________________  External Attachment:    Type:   Image     Comment:   External Document

## 2010-12-09 NOTE — Letter (Signed)
Summary: Appointment - Missed  [facility], Main Office  [address]   [place], [place] [address]   Phone: [phone]  Fax: [phone]     October 09, 2010 MRN: [id number]   [facility] [address] [place], [place]  [address]   Dear Ms. Angelo,  Our records indicate you missed your appointment on 09/04/10  with Dr. Gala Romney It is very important that we reach you to reschedule this appointment. We look forward to participating in your health care needs. Please contact us at the number listed above at your earliest convenience to reschedule this appointment.     Sincerely,  Artist

## 2010-12-09 NOTE — Miscellaneous (Signed)
Summary: [facility] Orders   [facility] Orders   Imported By: Roderic Ovens 07/28/2010 13:15:10  _____________________________________________________________________  External Attachment:    Type:   Image     Comment:   External Document

## 2010-12-09 NOTE — Miscellaneous (Signed)
Summary: [facility] Orders   [facility] Orders   Imported By: Roderic Ovens 08/07/2010 12:01:40  _____________________________________________________________________  External Attachment:    Type:   Image     Comment:   External Document

## 2010-12-09 NOTE — Miscellaneous (Signed)
Summary: Home Health Certification/Care Plan   Home Health Certification/Care Plan   Imported By: Roderic Ovens 07/15/2010 14:30:24  _____________________________________________________________________  External Attachment:    Type:   Image     Comment:   External Document

## 2010-12-09 NOTE — Assessment & Plan Note (Signed)
Summary: XFU//JG   Vital Signs:  Patient profile:   70 year old female Height:      54 inches Weight:      95.4 pounds Temp:     98.5 degrees F oral Pulse rate:   88 / minute Pulse rhythm:   regular Resp:     20 per minute BP sitting:   160 / 90  (left arm) Cuff size:   regular  Vitals Entered By: [place] Shannon (July 23, 2010 8:57 AM) CC: xf/u Is Patient Diabetic? No Pain Assessment Patient in pain? no       Does patient need assistance? Functional Status Self care Ambulation Normal   Primary Care Provider:  Tereso Newcomer, PA-C  CC:  xf/u.  History of Present Illness: New patient.   [name] is originally from [place].  She was admitted to the hospital in August with a non-ST elevation myocardial infarction in the setting of asthma exacerbation and lingular pneumonia.  She was treated with prednisone and antibiotics.  She had been out of her inhalers and these were restarted.  She had a cardiac catheterization that demonstrated a totally occluded RCA and moderate disease in the LAD and circumflex.  Her EF was 45-50%.  She was initially treated medically.  She then returned to the hospital several weeks later with atrial fibrillation with rapid ventricular rate.  She was placed on amiodarone.  The patient notes that since being discharged from the hospital she does feel better.  She continues to complain of dyspnea with exertion.  She describes NYHA class III symptoms.  She denies orthopnea or paroxysmal nocturnal dyspnea.  She denies any edema.  She denies any further chest pain.  She denies cough.  She denies hemoptysis.  She denies melena, hematochezia, hematemesis or dysuria or hematuria.  Problems Prior to Update: 1)  Essential Hypertension, Benign  (ICD-401.1) 2)  Pneumonia  (ICD-486) 3)  Anemia  (ICD-285.9) 4)  Dyspnea On Exertion  (ICD-786.09) 5)  Ischemic Cardiomyopathy  (ICD-414.8) 6)  Renal Insufficiency  (ICD-588.9) 7)  Chest Tightness-pressure-other   ([id number]) 8)  Dyspnea  (ICD-786.05) 9)  Hyperlipidemia-mixed  (ICD-272.4) 10)  Atrial Fibrillation  (ICD-427.31) 11)  Cad  (ICD-414.00) 12)  Asthma  (ICD-493.90)  Current Medications (verified): 1)  Plavix 75 Mg Tabs (Clopidogrel Bisulfate) .... Take One Tablet By Mouth Daily 2)  Prednisone 10 Mg Tabs (Prednisone) .... Take 1 Tablet By Mouth Once A Day 3)  Simvastatin 20 Mg Tabs (Simvastatin) .... Take One Tablet By Mouth Daily At Bedtime 4)  Nitrostat 0.4 Mg Subl (Nitroglycerin) .Marland Kitchen.. 1 Tablet Under Tongue At Onset of Chest Pain; You May Repeat Every 5 Minutes For Up To 3 Doses. 5)  Ventolin Hfa 108 (90 Base) Mcg/act Aers (Albuterol Sulfate) .... 2 Puffs As Needed 6)  Ventolin Hfa 108 (90 Base) Mcg/act Aers (Albuterol Sulfate) .... As Needed 7)  Carvedilol 6.25 Mg Tabs (Carvedilol) .... Take One Tablet By Mouth Twice A Day Ran Out 4 Days Ago 8)  Enalapril Maleate 5 Mg Tabs (Enalapril Maleate) .... Take One Tablet By Mouth Twice A Day Ran Out 4 Days Ago 9)  Amiodarone Hcl 200 Mg Tabs (Amiodarone Hcl) .... Take One Tab By Mouth Daily  Allergies (verified): No Known Drug Allergies  Past History:  Past Medical History: Asthma h/o  tuberculosis exposure with negative tuberculosis skin test, History of lingular pneumonia June 26, 2010 CAD   a.  RCA -100%; LAD 60%; CFx 80%   b.  med. therapy  Isch Cardiomyopathy with EF45-50% Acute renal insufficiency after cardiac catheterization August 2011 Dyslipidemia Hyperglycemia secondary to steroids Atrial Fibrillation   a. Amio. Rx   b. not a coumadin candidate . Marland Kitchen Marland Kitchenon ASA and Plavix Anemia  Past Surgical History: Cardiac Cath 06/26/2010   a. in setting of NSTEMI    Family History: Son says there is no history of heart disease no diabetes or cancer  Social History: Reviewed history from 07/15/2010 and no changes required.  The patient lives in [place] with her son.  She has   been in [place] for approximately 4 months  and prior to this was   in [place] for 8-9 months.  She is in the [place] from [place].   She is married with children.  She has 2 family members who are here in   [place].   She denies any tobacco, alcohol, or illicit drug use.      Physical Exam  General:  wn/wd female looking older than her stated age in NAD Head:  normocephalic and atraumatic.   Eyes:  pupils equal, pupils round, and pupils reactive to light.   Mouth:  poor dentition and teeth missing.   Neck:  supple and no thyromegaly.  no jvd Lungs:  decreased breath sounds dry crackles in left base Heart:  normal rate, regular rhythm, and no murmur.   Abdomen:  soft, non-tender, and no hepatomegaly.   Msk:  normal ROM.   Neurologic:  alert & oriented X3 and cranial nerves II-XII intact.   Psych:  normally interactive.     Impression & Recommendations:  Problem # 1:  CAD (ICD-414.00) med rx for CAD for now f/u with Dr. Gala Romney add amlodipine   The following medications were removed from the medication list:    Carvedilol 6.25 Mg Tabs (Carvedilol) .Marland Kitchen... Take one tablet by mouth twice a day ran out 4 days ago Her updated medication list for this problem includes:    Plavix 75 Mg Tabs (Clopidogrel bisulfate) .Marland Kitchen... Take one tablet by mouth daily    Nitrostat 0.4 Mg Subl (Nitroglycerin) .Marland Kitchen... 1 tablet under tongue at onset of chest pain; you may repeat every 5 minutes for up to 3 doses.    Enalapril Maleate 5 Mg Tabs (Enalapril maleate) .Marland Kitchen... Take one tablet by mouth twice a day ran out 4 days ago    Amiodarone Hcl 200 Mg Tabs (Amiodarone hcl) .Marland Kitchen... Take one tab by mouth daily    Amlodipine Besylate 5 Mg Tabs (Amlodipine besylate) .Marland Kitchen... Take 1 tablet by mouth once a day  Problem # 2:  DYSPNEA ON EXERTION (ICD-786.09)  ? related to her asthma vs. anginal equivalent she certainly has disease that could cause symptoms  but also wheezing on exam . . . not certain what other inhalers she has had in past no  clear evidence of vol overload on exam add flovent add amlodipine eventually get PFTs esp since on Amio . Marland Kitchen needs recovery from recent pneumonia and asthma exac.  The following medications were removed from the medication list:    Carvedilol 6.25 Mg Tabs (Carvedilol) .Marland Kitchen... Take one tablet by mouth twice a day ran out 4 days ago Her updated medication list for this problem includes:    Ventolin Hfa 108 (90 Base) Mcg/act Aers (Albuterol sulfate) .Marland Kitchen... 2 puffs as needed    Ventolin Hfa 108 (90 Base) Mcg/act Aers (Albuterol sulfate) .Marland Kitchen... As needed    Enalapril Maleate 5 Mg Tabs (Enalapril maleate) .Marland Kitchen... Take one  tablet by mouth twice a day ran out 4 days ago    Qvar 40 Mcg/act Aers (Beclomethasone dipropionate) .Marland Kitchen... 1 inhalation two times a day.  rinse your mouth out well after each use.  Orders: CXR- 2view (CXR)  Problem # 3:  ATRIAL FIBRILLATION (ICD-427.31) maintaining sinus brady no coreg in several days has amio on board with asthma and optimal heart rate, will keep her off beta blocker for now if heart rate picks up, add toprol (selective beta blocker) recent tsh and lft's ok  The following medications were removed from the medication list:    Carvedilol 6.25 Mg Tabs (Carvedilol) .Marland Kitchen... Take one tablet by mouth twice a day ran out 4 days ago Her updated medication list for this problem includes:    Plavix 75 Mg Tabs (Clopidogrel bisulfate) .Marland Kitchen... Take one tablet by mouth daily    Amiodarone Hcl 200 Mg Tabs (Amiodarone hcl) .Marland Kitchen... Take one tab by mouth daily    Amlodipine Besylate 5 Mg Tabs (Amlodipine besylate) .Marland Kitchen... Take 1 tablet by mouth once a day  Problem # 4:  RENAL INSUFFICIENCY (ICD-588.9) creatinine 1.08 durin recent admxn  Problem # 5:  ANEMIA (ICD-285.9)  hgb 10.3 in hosp check cbc and iron studies prob do stool cards at next visit or sooner if hgb worse  Orders: T-CBC w/Diff ([address]) T-Ferritin [phone]) T-Iron ([address]-13086) T-Iron Binding Capacity  (TIBC) ([address])  Problem # 6:  HYPERLIPIDEMIA-MIXED (ICD-272.4)  Her updated medication list for this problem includes:    Simvastatin 20 Mg Tabs (Simvastatin) .Marland Kitchen... Take one tablet by mouth daily at bedtime  Problem # 7:  ESSENTIAL HYPERTENSION, BENIGN (ICD-401.1) add amlodipine  The following medications were removed from the medication list:    Carvedilol 6.25 Mg Tabs (Carvedilol) .Marland Kitchen... Take one tablet by mouth twice a day ran out 4 days ago Her updated medication list for this problem includes:    Enalapril Maleate 5 Mg Tabs (Enalapril maleate) .Marland Kitchen... Take one tablet by mouth twice a day ran out 4 days ago    Amlodipine Besylate 5 Mg Tabs (Amlodipine besylate) .Marland Kitchen... Take 1 tablet by mouth once a day  Complete Medication List: 1)  Plavix 75 Mg Tabs (Clopidogrel bisulfate) .... Take one tablet by mouth daily 2)  Prednisone 10 Mg Tabs (Prednisone) .... Take 1 tablet by mouth once a day 3)  Simvastatin 20 Mg Tabs (Simvastatin) .... Take one tablet by mouth daily at bedtime 4)  Nitrostat 0.4 Mg Subl (Nitroglycerin) .Marland Kitchen.. 1 tablet under tongue at onset of chest pain; you may repeat every 5 minutes for up to 3 doses. 5)  Ventolin Hfa 108 (90 Base) Mcg/act Aers (Albuterol sulfate) .... 2 puffs as needed 6)  Ventolin Hfa 108 (90 Base) Mcg/act Aers (Albuterol sulfate) .... As needed 7)  Enalapril Maleate 5 Mg Tabs (Enalapril maleate) .... Take one tablet by mouth twice a day ran out 4 days ago 8)  Amiodarone Hcl 200 Mg Tabs (Amiodarone hcl) .... Take one tab by mouth daily 9)  Qvar 40 Mcg/act Aers (Beclomethasone dipropionate) .Marland Kitchen.. 1 inhalation two times a day.  rinse your mouth out well after each use. 10)  Amlodipine Besylate 5 Mg Tabs (Amlodipine besylate) .... Take 1 tablet by mouth once a day  Patient Instructions: 1)  You are currently not taking Coreg (Carvedilol).  Do not restart this medication. 2)  Use QVar  two times a day every day for asthma.  Make sure you rinse your mouth out  very well after each  use. 3)  Get your chest xray done. 4)  Schedule a follow up with Scott in 3-4 weeks for breathing. Prescriptions: AMLODIPINE BESYLATE 5 MG TABS (AMLODIPINE BESYLATE) Take 1 tablet by mouth once a day  #30 x 5   Entered and Authorized by:   Tereso Newcomer PA-C   Signed by:   Tereso Newcomer PA-C on 07/23/2010   Method used:   Print then Give to Patient   RxID:   [id number] QVAR 40 MCG/ACT AERS (BECLOMETHASONE DIPROPIONATE) 1 inhalation two times a day.  Rinse your mouth out well after each use.  #1 x 5   Entered and Authorized by:   Tereso Newcomer PA-C   Signed by:   Tereso Newcomer PA-C on 07/23/2010   Method used:   Print then Give to Patient   RxID:   [id number]    CXR  Procedure date:  07/17/2010  Findings:       Findings: The cardiac silhouette, mediastinum, pulmonary   vasculature are within normal limits.  Faint, patchy opacity is   present within the right lower lung which could represent   developing infiltrate. Lingular opacity has improved compared the   prior study. There is no acute bony abnormality.    IMPRESSION:   Faint, patchy opacity in the right lower lobe could represent   developing pneumonia.  Please follow until clearing.   EKG  Procedure date:  07/23/2010  Findings:      Sinus bradycardia with rate of:  57 q waves in 2,3 avF borderline LVH PR 172

## 2010-12-09 NOTE — Procedures (Signed)
Summary: ERCP  Patient: [name] Note: All result statuses are Final unless otherwise noted.  Tests: (1) ERCP (ERC)   ERC ERCP                  DONE     [facility]     [address]     [place], [place]  [address]           ERCP PROCEDURE REPORT           PATIENT:  [name], [name]  MR#:  [id number]     BIRTHDATE:  Mar 16, 1941  GENDER:  female     ENDOSCOPIST:  Venita Lick. Russella Dar, MD, [facility]           PROCEDURE DATE:  09/16/2010     PROCEDURE:  ERCP with sphincterotomy     INDICATIONS:  jaundice, elevated LFTs, CBD stones on CT scan     MEDICATIONS:  Fentanyl 125 mcg IV, Versed 10 mg IV, glucagon 1.5     mg IV     TOPICAL ANESTHETIC:  Cetacaine Spray           DESCRIPTION OF PROCEDURE:   After the risks benefits and     alternatives of the procedure were thoroughly explained, informed     consent was obtained.  The [id number] ([id number]) endoscope was     introduced through the mouth and advanced to the third portion of     the duodenum. Photos post sphinterotomy did not capture.           The pancreatic duct was filled to the tail and appeared to be     normal. Care was taken not to overfill the ductal system. I could     not freely cannulate the PD with a catheter or a guidewire and the     PD was filling with partial cannulation. A normal appearing     ampulla was visualized. There was no evidence of papillitis or any     trauma to the ampulla. Multiple, unsuccessful attempts were made     to freely cannulate the CBD with 2 sphincterotomes and 2     guidewires. Several unsuccessful attempts were made to fill the     biliary tree with contrast.  A small sphincterotomy was performed     in the 12 to 1 o'clock position with a pre-cut papillitome as the     patient has known CBD stones. The precut lead to oozing which     stopped spontaneously however a small clot formed covering the     site and I could no longer  visualize the anatomy to attempt     access to  the biliary tree. Since the bleeding appeared to have     stopped I did not apply hemostatic therapy. The scope was then     completely withdrawn from the patient and the procedure     terminated.     <<PROCEDUREIMAGES>>           COMPLICATIONS:  None           ENDOSCOPIC IMPRESSION:     1) Normal pancreatic duct     2) Normal ampulla, precut sphincterotomy     3) Unsuccessful biliary exam           RECOMMENDATIONS:     1) Continue IV antibiotics     2) NPO and observe closely for rebleed  Venita Lick. Russella Dar, MD, [facility]           n.     eSIGNED:   Venita Lick. Dann Ventress at 09/17/2010 11:14 AM           [employer] Public, [phone]  Note: An exclamation mark (!) indicates a result that was not dispersed into the flowsheet. Document Creation Date: 09/17/2010 11:15 AM _______________________________________________________________________  (1) Order result status: Final Collection or observation date-time: 09/16/2010 12:50 Requested date-time:  Receipt date-time:  Reported date-time:  Referring Physician:   Ordering Physician: Claudette Head [phone]) Specimen Source:  Source: Launa Grill Order Number: [id number] Lab site:

## 2010-12-09 NOTE — Assessment & Plan Note (Signed)
Summary: eph/post cath   Visit Type:  Post-hospital  CC:  Sob.  History of Present Illness: This is a 70 year old female who moved here 9 months ago from [place]. She was recently hospitalized August 18-21, 2011 with a non-ST elevation MI and asthma exacerbation. She had a cardiac catheterization that showed total RCA, 60% LAD, an 80% circumflex with mild reduction in LV function ejection fraction 45-50%. The patient does not speaking leash and no one in her family was available in the hospital that spoke any [place]. Discussion over stent versus bypass surgery was done but because there was no one to interpret she was treated medically. If she was found to have recurrent chest pain she would be readmitted for and reevaluated. The patient also had lingular pneumonia, renal insufficiency, and has a history of tuberculosis exposure with negative TB skin test in the past.  The patient is brought into the office by her son today for her regular scheduled followup. She ran out of her carvedilol and enalapril 4 days ago. She complains of shortness of breath secondary to her asthma. She says her chest is entirely up into her throat and arms for the past 4 days. Her heart is racing and she is use nitroglycerin just about every day. This is all interpreted through her son. She is currently in rapid atrial fibrillation at a rate of 164 beats per minute.  Current Medications (verified): 1)  Plavix 75 Mg Tabs (Clopidogrel Bisulfate) .... Take One Tablet By Mouth Daily 2)  Prednisone 10 Mg Tabs (Prednisone) .... Take 1 Tablet By Mouth Once A Day 3)  Simvastatin 20 Mg Tabs (Simvastatin) .... Take One Tablet By Mouth Daily At Bedtime 4)  Nitrostat 0.4 Mg Subl (Nitroglycerin) .Marland Kitchen.. 1 Tablet Under Tongue At Onset of Chest Pain; You May Repeat Every 5 Minutes For Up To 3 Doses. 5)  Ventolin Hfa 108 (90 Base) Mcg/act Aers (Albuterol Sulfate) .... 2 Puffs As Needed 6)  Ventolin Hfa 108 (90 Base) Mcg/act Aers (Albuterol  Sulfate) .... As Needed 7)  Carvedilol 6.25 Mg Tabs (Carvedilol) .... Take One Tablet By Mouth Twice A Day Ran Out 4 Days Ago 8)  Enalapril Maleate 5 Mg Tabs (Enalapril Maleate) .... Take One Tablet By Mouth Twice A Day Ran Out 4 Days Ago  Allergies (verified): No Known Drug Allergies  Past History:  Social History: Last updated: 07/15/2010  The patient lives in [place] with her son.  She has   been in [place] for approximately 4 months and prior to this was   in [place] for 8-9 months.  She is in the [place] from [place].   She is married with children.  She has 18 family members who are here in   [place].  She denies any tobacco, alcohol, or illicit drug use.      Past Medical History:   Asthma  tuberculosis exposure with negative tuberculosis skin test, History of lingular pneumonia June 26, 2010 Acute renal insufficiency after cardiac catheterization August 2011 Dyslipidemia Hyperglycemia secondary to steroids  Family History: Reviewed history from 07/15/2010 and no changes required. Son says there is no history of heart disease  Social History: Reviewed history from 07/15/2010 and no changes required.  The patient lives in [place] with her son.  She has   been in [place] for approximately 4 months and prior to this was   in [place] for 8-9 months.  She is in the [place] from [place].   She is  married with children.  She has 58 family members who are here in   [place].  She denies any tobacco, alcohol, or illicit drug use.      Review of Systems       see history of present illness. History is very difficult to obtain secondary to the language barrier. She continues to think everything she has is secondary to her asthma.  Vital Signs:  Patient profile:   70 year old female Height:      54 inches Weight:      98.75 pounds BMI:     23.90 BSA:     1.28 O2 Sat:      98 % on Room air Pulse rate:   164 / minute Pulse  rhythm:   irregular Resp:     24 per minute BP sitting:   116 / 80  (left arm) Cuff size:   small  Vitals Entered By: Vikki Ports (July 17, 2010 12:58 PM)  O2 Flow:  Room air  Physical Exam  General:  Small woman who is short of breath Neck: No JVD, HJR, Bruit, or thyroid enlargement Lungs: decreased breath sounds throughout without wheezing, rales or rhonchi Cardiovascular:irregular irregular, PMI not displaced, heart sounds normal, no murmurs, gallops, bruit, thrill, or heave. Abdomen: BS normal. Soft without organomegaly, masses, lesions or tenderness. Extremities: without cyanosis, clubbing or edema. Good distal pulses bilateral SKin: Warm, no lesions or rashes  Musculoskeletal: No deformities Neuro: no focal signs    EKG  Procedure date:  07/17/2010  Findings:      atrial fibrillation at 164 beats per per minute with nonspecific ST-T wave changes  Impression & Recommendations:  Problem # 1:  DYSPNEA (ICD-786.05) Patient presented with dyspnea and chest pain on routine follow up in the office today. She ran out of her carvedilol and enalapril 4 days ago. She has had a recent cardiac catheterization June 26, 2010 showing mild reduction in LV systolic function with inferior basal wall motion abnormality, total RCA, 60% LAD, an 80% circumflex, stenting versus CABG was considered, but the patient does not speak [place] and she had no family members there that could speak [place] so medic home therapy was initiated. The patient is now having trouble with unstable angina and rapid atrial fibrillation. We will admit her to the hospital for rate control and treatment of her coronary artery disease Her updated medication list for this problem includes:    Carvedilol 6.25 Mg Tabs (Carvedilol) .Marland Kitchen... Take one tablet by mouth twice a day ran out 4 days ago    Enalapril Maleate 5 Mg Tabs (Enalapril maleate) .Marland Kitchen... Take one tablet by mouth twice a day ran out 4 days ago  Problem #  2:  CAD (ICD-414.00) see above dictation. Cardiologist will discuss possible stenting versus CABG Her updated medication list for this problem includes:    Plavix 75 Mg Tabs (Clopidogrel bisulfate) .Marland Kitchen... Take one tablet by mouth daily    Nitrostat 0.4 Mg Subl (Nitroglycerin) .Marland Kitchen... 1 tablet under tongue at onset of chest pain; you may repeat every 5 minutes for up to 3 doses.    Carvedilol 6.25 Mg Tabs (Carvedilol) .Marland Kitchen... Take one tablet by mouth twice a day ran out 4 days ago    Enalapril Maleate 5 Mg Tabs (Enalapril maleate) .Marland Kitchen... Take one tablet by mouth twice a day ran out 4 days ago  Orders: EKG w/ Interpretation (93000)  Problem # 3:  ATRIAL FIBRILLATION (ICD-427.31) patient is in rapid atrial  fibrillation which is new for her. We will admit her to the hospital and start IV Cardizem and IV digoxin for better rate control. We will also place her on IV heparin Her updated medication list for this problem includes:    Plavix 75 Mg Tabs (Clopidogrel bisulfate) .Marland Kitchen... Take one tablet by mouth daily    Carvedilol 6.25 Mg Tabs (Carvedilol) .Marland Kitchen... Take one tablet by mouth twice a day ran out 4 days ago  Problem # 4:  ASTHMA (ICD-493.90) Patient has had asthma for many years and has one more day of a prednisone taper left. We will continue this. Her updated medication list for this problem includes:    Prednisone 10 Mg Tabs (Prednisone) .Marland Kitchen... Take 1 tablet by mouth once a day    Ventolin Hfa 108 (90 Base) Mcg/act Aers (Albuterol sulfate) .Marland Kitchen... 2 puffs as needed    Ventolin Hfa 108 (90 Base) Mcg/act Aers (Albuterol sulfate) .Marland Kitchen... As needed  Problem # 5:  HYPERLIPIDEMIA-MIXED (ICD-272.4) treated Her updated medication list for this problem includes:    Simvastatin 20 Mg Tabs (Simvastatin) .Marland Kitchen... Take one tablet by mouth daily at bedtime  Problem # 6:  RENAL INSUFFICIENCY (ICD-588.9) Patient had renal insufficiency her last hospitalization. We will recheck this.  Patient Instructions: 1)  We  will admit this patient to the hospital for treatment of her unstable angina and rapid atrial fibrillation we will control her heart rate and consult the cardiologist and surgeons concerning possible stenting versus bypass surgery. She will need her son to interpret for her.

## 2010-12-09 NOTE — Letter (Signed)
Summary: *HSN Results Follow up  [facility]  [address] [place], [place] [address]   Phone: [phone]  Fax: [phone]      07/25/2010   CHERISE FEDDER [address] [place], [place]  [address]   Dear  Ms. GANG Galicia,                            ____S.Drinkard,FNP   ____D. Gore,FNP       ____B. McPherson,MD   ____V. Rankins,MD    ____E. Mulberry,MD    ____N. Daphine Deutscher, FNP  ____D. Reche Dixon, MD    ____K. Philipp Deputy, MD    ____Other     This letter is to inform you that your recent test(s):  _______Pap Smear    ___X____Lab Test     _______X-ray    ____X___ is within acceptable limits  _______ requires a medication change  _______ requires a follow-up lab visit  _______ requires a follow-up visit with your provider   Comments: Your recent blood work was okay.  We will like you to start taking a multivitamin with iron once daily.       _________________________________________________________ If you have any questions, please contact our office                     Sincerely,  [place] Shannon [facility]

## 2010-12-09 NOTE — Progress Notes (Signed)
Summary: pt needs help with Rx  Phone Note Call from Patient Call back at [phone]   Caller: Son / Florence Canner Birkel Reason for Call: Talk to Nurse, Talk to Doctor Summary of Call: pt was given Rx @ discharge and Pharm will not fill it because it is not covered under medicade.  Initial call taken by: Omer Jack,  July 03, 2010 9:35 AM  Follow-up for Phone Call        07/03/10--10am--very difficult to figure out who is actually calling about this pt,  as son's name on call, but [facility] phone # is given for return call--spoke with Noreene Larsson from [facility] and she stated it was her first visit to home,they can't afford any meds, and no one speaks english, and she was actually needing order for social service to visit pt--order given for soccial service consult, also advised to check with [facility] for meds or care Follow-up by: Ledon Snare, RN,  July 03, 2010 12:18 PM

## 2010-12-10 ENCOUNTER — Inpatient Hospital Stay (HOSPITAL_COMMUNITY): Payer: Medicaid Other

## 2010-12-10 LAB — GLUCOSE, CAPILLARY: Glucose-Capillary: 101 mg/dL — ABNORMAL HIGH (ref 70–99)

## 2010-12-10 MED ORDER — IOHEXOL 300 MG/ML  SOLN: 50.0000 mL | Freq: Once | INTRAMUSCULAR | Status: AC | PRN

## 2010-12-11 LAB — COMPREHENSIVE METABOLIC PANEL
ALT: 43 U/L — ABNORMAL HIGH (ref 0–35)
AST: 50 U/L — ABNORMAL HIGH (ref 0–37)
CO2: 19 mEq/L (ref 19–32)
Chloride: 107 mEq/L (ref 96–112)
Creatinine, Ser: 0.85 mg/dL (ref 0.4–1.2)
GFR calc Af Amer: >60 mL/min (ref >60)
GFR calc non Af Amer: >60 mL/min (ref >60)
Glucose, Bld: 104 mg/dL — ABNORMAL HIGH (ref 70–99)
Sodium: 134 mEq/L — ABNORMAL LOW (ref 135–145)
Total Bilirubin: 0.6 mg/dL (ref 0.3–1.2)

## 2010-12-11 LAB — MAGNESIUM: Magnesium: 1.7 mg/dL (ref 1.5–2.5)

## 2010-12-11 LAB — GLUCOSE, CAPILLARY

## 2010-12-12 LAB — PHOSPHORUS: Phosphorus: 5.3 mg/dL — ABNORMAL HIGH (ref 2.3–4.6)

## 2010-12-12 LAB — MAGNESIUM: Magnesium: 2 mg/dL (ref 1.5–2.5)

## 2010-12-12 LAB — BASIC METABOLIC PANEL
GFR calc non Af Amer: >60 mL/min (ref >60)
Potassium: 4.1 mEq/L (ref 3.5–5.1)
Sodium: 135 mEq/L (ref 135–145)

## 2010-12-13 LAB — PHOSPHORUS: Phosphorus: 3.3 mg/dL (ref 2.3–4.6)

## 2010-12-13 LAB — BASIC METABOLIC PANEL
Calcium: 9.2 mg/dL (ref 8.4–10.5)
GFR calc Af Amer: >60 mL/min (ref >60)
GFR calc non Af Amer: >60 mL/min (ref >60)
Sodium: 133 mEq/L — ABNORMAL LOW (ref 135–145)

## 2010-12-14 LAB — BASIC METABOLIC PANEL
Calcium: 9.6 mg/dL (ref 8.4–10.5)
GFR calc Af Amer: >60 mL/min (ref >60)
GFR calc non Af Amer: >60 mL/min (ref >60)
Glucose, Bld: 114 mg/dL — ABNORMAL HIGH (ref 70–99)
Sodium: 133 mEq/L — ABNORMAL LOW (ref 135–145)

## 2010-12-15 LAB — CBC
MCH: 27.9 pg (ref 26.0–34.0)
MCHC: 32 g/dL (ref 30.0–36.0)
Platelets: 260 10*3/uL (ref 150–400)
RBC: 3.37 MIL/uL — ABNORMAL LOW (ref 3.87–5.11)

## 2010-12-15 LAB — COMPREHENSIVE METABOLIC PANEL
Albumin: 2.5 g/dL — ABNORMAL LOW (ref 3.5–5.2)
BUN: 35 mg/dL — ABNORMAL HIGH (ref 6–23)
Calcium: 9.1 mg/dL (ref 8.4–10.5)
Creatinine, Ser: 0.83 mg/dL (ref 0.4–1.2)
Potassium: 4.2 mEq/L (ref 3.5–5.1)
Total Protein: 6.6 g/dL (ref 6.0–8.3)

## 2010-12-15 LAB — CHOLESTEROL, TOTAL: Cholesterol: 137 mg/dL (ref 0–200)

## 2010-12-15 LAB — PHOSPHORUS: Phosphorus: 4.7 mg/dL — ABNORMAL HIGH (ref 2.3–4.6)

## 2010-12-15 LAB — DIFFERENTIAL
Basophils Relative: 1 % (ref 0–1)
Eosinophils Absolute: 0.3 10*3/uL (ref 0.0–0.7)
Monocytes Relative: 7 % (ref 3–12)
Neutrophils Relative %: 61 % (ref 43–77)

## 2010-12-18 LAB — COMPREHENSIVE METABOLIC PANEL
ALT: 58 U/L — ABNORMAL HIGH (ref 0–35)
Alkaline Phosphatase: 132 U/L — ABNORMAL HIGH (ref 39–117)
CO2: 23 mEq/L (ref 19–32)
Calcium: 9.2 mg/dL (ref 8.4–10.5)
GFR calc non Af Amer: >60 mL/min (ref >60)
Glucose, Bld: 102 mg/dL — ABNORMAL HIGH (ref 70–99)
Potassium: 4 mEq/L (ref 3.5–5.1)
Sodium: 136 mEq/L (ref 135–145)
Total Bilirubin: 0.9 mg/dL (ref 0.3–1.2)

## 2010-12-18 LAB — MAGNESIUM: Magnesium: 1.8 mg/dL (ref 1.5–2.5)

## 2010-12-20 LAB — BASIC METABOLIC PANEL
BUN: 24 mg/dL — ABNORMAL HIGH (ref 6–23)
Calcium: 9.4 mg/dL (ref 8.4–10.5)
GFR calc non Af Amer: >60 mL/min (ref >60)
Glucose, Bld: 72 mg/dL (ref 70–99)

## 2010-12-21 LAB — BASIC METABOLIC PANEL
CO2: 21 mEq/L (ref 19–32)
Calcium: 9.3 mg/dL (ref 8.4–10.5)
Creatinine, Ser: 0.85 mg/dL (ref 0.4–1.2)
Glucose, Bld: 66 mg/dL — ABNORMAL LOW (ref 70–99)

## 2010-12-21 LAB — GLUCOSE, CAPILLARY

## 2010-12-22 LAB — DIFFERENTIAL
Eosinophils Relative: 3 % (ref 0–5)
Lymphocytes Relative: 38 % (ref 12–46)
Lymphs Abs: 2.2 10*3/uL (ref 0.7–4.0)
Monocytes Absolute: 0.4 10*3/uL (ref 0.1–1.0)
Neutro Abs: 2.8 10*3/uL (ref 1.7–7.7)

## 2010-12-22 LAB — CBC
HCT: 29.7 % — ABNORMAL LOW (ref 36.0–46.0)
Hemoglobin: 9.8 g/dL — ABNORMAL LOW (ref 12.0–15.0)
MCV: 87.1 fL (ref 78.0–100.0)
RDW: 15.3 % (ref 11.5–15.5)
WBC: 5.7 10*3/uL (ref 4.0–10.5)

## 2010-12-22 LAB — COMPREHENSIVE METABOLIC PANEL
ALT: 72 U/L — ABNORMAL HIGH (ref 0–35)
AST: 82 U/L — ABNORMAL HIGH (ref 0–37)
Alkaline Phosphatase: 148 U/L — ABNORMAL HIGH (ref 39–117)
CO2: 20 mEq/L (ref 19–32)
Calcium: 9.5 mg/dL (ref 8.4–10.5)
Chloride: 113 mEq/L — ABNORMAL HIGH (ref 96–112)
GFR calc non Af Amer: 59 mL/min — ABNORMAL LOW (ref >60)
Glucose, Bld: 121 mg/dL — ABNORMAL HIGH (ref 70–99)
Sodium: 138 mEq/L (ref 135–145)
Total Bilirubin: 0.9 mg/dL (ref 0.3–1.2)

## 2010-12-22 LAB — PREALBUMIN: Prealbumin: 16.7 mg/dL — ABNORMAL LOW (ref 17.0–34.0)

## 2010-12-22 LAB — MAGNESIUM: Magnesium: 1.6 mg/dL (ref 1.5–2.5)

## 2010-12-23 ENCOUNTER — Inpatient Hospital Stay
Admission: RE | Admit: 2010-12-23 | Discharge: 2011-01-21 | Disposition: A | Discharge status: Other Acute Inpatient Hospital | Payer: Medicaid Other | Source: Ambulatory Visit | Attending: Internal Medicine | Admitting: Internal Medicine

## 2010-12-23 DIAGNOSIS — J189 Pneumonia, unspecified organism: Secondary | ICD-10-CM

## 2010-12-23 DIAGNOSIS — R7611 Nonspecific reaction to tuberculin skin test without active tuberculosis: Secondary | ICD-10-CM

## 2010-12-23 DIAGNOSIS — K56609 Unspecified intestinal obstruction, unspecified as to partial versus complete obstruction: Principal | ICD-10-CM

## 2010-12-23 LAB — BASIC METABOLIC PANEL
BUN: 26 mg/dL — ABNORMAL HIGH (ref 6–23)
CO2: 20 mEq/L (ref 19–32)
Chloride: 113 mEq/L — ABNORMAL HIGH (ref 96–112)
Creatinine, Ser: 0.91 mg/dL (ref 0.4–1.2)
Glucose, Bld: 125 mg/dL — ABNORMAL HIGH (ref 70–99)
Potassium: 3.8 mEq/L (ref 3.5–5.1)

## 2010-12-29 ENCOUNTER — Ambulatory Visit (HOSPITAL_COMMUNITY): Payer: Medicaid Other | Attending: Internal Medicine

## 2010-12-29 ENCOUNTER — Ambulatory Visit (HOSPITAL_COMMUNITY): Payer: Medicaid Other

## 2010-12-29 ENCOUNTER — Inpatient Hospital Stay (HOSPITAL_COMMUNITY): Payer: Medicaid Other

## 2010-12-29 DIAGNOSIS — K56609 Unspecified intestinal obstruction, unspecified as to partial versus complete obstruction: Secondary | ICD-10-CM | POA: Insufficient documentation

## 2011-01-07 ENCOUNTER — Ambulatory Visit (HOSPITAL_COMMUNITY): Payer: Medicaid Other | Attending: Internal Medicine

## 2011-01-07 DIAGNOSIS — J189 Pneumonia, unspecified organism: Secondary | ICD-10-CM | POA: Insufficient documentation

## 2011-01-09 LAB — GLUCOSE, CAPILLARY
Glucose-Capillary: 92 mg/dL (ref 70–99)
Glucose-Capillary: 106 mg/dL — ABNORMAL HIGH (ref 70–99)
Glucose-Capillary: 111 mg/dL — ABNORMAL HIGH (ref 70–99)

## 2011-01-11 LAB — GLUCOSE, CAPILLARY
Glucose-Capillary: 109 mg/dL — ABNORMAL HIGH (ref 70–99)
Glucose-Capillary: 115 mg/dL — ABNORMAL HIGH (ref 70–99)

## 2011-01-12 ENCOUNTER — Inpatient Hospital Stay: Payer: Medicaid Other

## 2011-01-12 LAB — GLUCOSE, CAPILLARY
Glucose-Capillary: 98 mg/dL (ref 70–99)
Glucose-Capillary: 66 mg/dL — ABNORMAL LOW (ref 70–99)
Glucose-Capillary: 85 mg/dL (ref 70–99)

## 2011-01-13 ENCOUNTER — Ambulatory Visit (HOSPITAL_COMMUNITY): Payer: Medicaid Other

## 2011-01-13 LAB — GLUCOSE, CAPILLARY
Glucose-Capillary: 112 mg/dL — ABNORMAL HIGH (ref 70–99)
Glucose-Capillary: 111 mg/dL — ABNORMAL HIGH (ref 70–99)

## 2011-01-15 ENCOUNTER — Ambulatory Visit (HOSPITAL_COMMUNITY): Payer: Medicaid Other | Attending: Internal Medicine

## 2011-01-15 DIAGNOSIS — R7611 Nonspecific reaction to tuberculin skin test without active tuberculosis: Secondary | ICD-10-CM | POA: Insufficient documentation

## 2011-01-15 DIAGNOSIS — R05 Cough: Secondary | ICD-10-CM | POA: Insufficient documentation

## 2011-01-15 DIAGNOSIS — R059 Cough, unspecified: Secondary | ICD-10-CM | POA: Insufficient documentation

## 2011-01-19 LAB — CBC
HCT: 25.3 % — ABNORMAL LOW (ref 36.0–46.0)
HCT: 26 % — ABNORMAL LOW (ref 36.0–46.0)
HCT: 26.4 % — ABNORMAL LOW (ref 36.0–46.0)
HCT: 27.2 % — ABNORMAL LOW (ref 36.0–46.0)
Hemoglobin: 10.6 g/dL — ABNORMAL LOW (ref 12.0–15.0)
Hemoglobin: 8.7 g/dL — ABNORMAL LOW (ref 12.0–15.0)
Hemoglobin: 8.8 g/dL — ABNORMAL LOW (ref 12.0–15.0)
Hemoglobin: 9.1 g/dL — ABNORMAL LOW (ref 12.0–15.0)
MCH: 29 pg (ref 26.0–34.0)
MCH: 29.3 pg (ref 26.0–34.0)
MCH: 29.7 pg (ref 26.0–34.0)
MCH: 29.9 pg (ref 26.0–34.0)
MCH: 30.5 pg (ref 26.0–34.0)
MCHC: 33.3 g/dL (ref 30.0–36.0)
MCHC: 33.9 g/dL (ref 30.0–36.0)
MCHC: 34.1 g/dL (ref 30.0–36.0)
MCV: 85.8 fL (ref 78.0–100.0)
MCV: 87.8 fL (ref 78.0–100.0)
MCV: 88.9 fL (ref 78.0–100.0)
MCV: 89.7 fL (ref 78.0–100.0)
Platelets: 207 10*3/uL (ref 150–400)
Platelets: 213 10*3/uL (ref 150–400)
Platelets: 236 10*3/uL (ref 150–400)
Platelets: 244 10*3/uL (ref 150–400)
RBC: 2.82 MIL/uL — ABNORMAL LOW (ref 3.87–5.11)
RBC: 2.94 MIL/uL — ABNORMAL LOW (ref 3.87–5.11)
RBC: 2.97 MIL/uL — ABNORMAL LOW (ref 3.87–5.11)
RBC: 3.03 MIL/uL — ABNORMAL LOW (ref 3.87–5.11)
RBC: 3.14 MIL/uL — ABNORMAL LOW (ref 3.87–5.11)
RBC: 3.4 MIL/uL — ABNORMAL LOW (ref 3.87–5.11)
RBC: 3.58 MIL/uL — ABNORMAL LOW (ref 3.87–5.11)
RDW: 14.5 % (ref 11.5–15.5)
RDW: 15 % (ref 11.5–15.5)
WBC: 6.1 10*3/uL (ref 4.0–10.5)
WBC: 6.6 10*3/uL (ref 4.0–10.5)
WBC: 6.8 10*3/uL (ref 4.0–10.5)
WBC: 9.1 10*3/uL (ref 4.0–10.5)

## 2011-01-19 LAB — CHOLESTEROL, TOTAL
Cholesterol: 100 mg/dL (ref 0–200)
Cholesterol: 103 mg/dL (ref 0–200)

## 2011-01-19 LAB — DIFFERENTIAL
Basophils Absolute: 0 10*3/uL (ref 0.0–0.1)
Basophils Absolute: 0.1 10*3/uL (ref 0.0–0.1)
Basophils Relative: 1 % (ref 0–1)
Basophils Relative: 1 % (ref 0–1)
Basophils Relative: 1 % (ref 0–1)
Eosinophils Absolute: 0.2 10*3/uL (ref 0.0–0.7)
Eosinophils Absolute: 0.3 10*3/uL (ref 0.0–0.7)
Eosinophils Relative: 2 % (ref 0–5)
Eosinophils Relative: 4 % (ref 0–5)
Eosinophils Relative: 4 % (ref 0–5)
Eosinophils Relative: 5 % (ref 0–5)
Lymphocytes Relative: 24 % (ref 12–46)
Lymphocytes Relative: 26 % (ref 12–46)
Lymphs Abs: 1.6 10*3/uL (ref 0.7–4.0)
Lymphs Abs: 1.6 10*3/uL (ref 0.7–4.0)
Monocytes Absolute: 0.7 10*3/uL (ref 0.1–1.0)
Monocytes Absolute: 1.1 10*3/uL — ABNORMAL HIGH (ref 0.1–1.0)
Monocytes Relative: 15 % — ABNORMAL HIGH (ref 3–12)
Monocytes Relative: 18 % — ABNORMAL HIGH (ref 3–12)
Neutro Abs: 3.1 10*3/uL (ref 1.7–7.7)
Neutro Abs: 4.6 10*3/uL (ref 1.7–7.7)
Neutrophils Relative %: 48 % (ref 43–77)
Neutrophils Relative %: 64 % (ref 43–77)

## 2011-01-19 LAB — OSMOLALITY, URINE: Osmolality, Ur: 611 mOsm/kg (ref 390–1090)

## 2011-01-19 LAB — MAGNESIUM
Magnesium: 1.4 mg/dL — ABNORMAL LOW (ref 1.5–2.5)
Magnesium: 1.7 mg/dL (ref 1.5–2.5)
Magnesium: 1.7 mg/dL (ref 1.5–2.5)
Magnesium: 1.9 mg/dL (ref 1.5–2.5)

## 2011-01-19 LAB — GLUCOSE, CAPILLARY
Glucose-Capillary: 114 mg/dL — ABNORMAL HIGH (ref 70–99)
Glucose-Capillary: 115 mg/dL — ABNORMAL HIGH (ref 70–99)
Glucose-Capillary: 118 mg/dL — ABNORMAL HIGH (ref 70–99)
Glucose-Capillary: 120 mg/dL — ABNORMAL HIGH (ref 70–99)
Glucose-Capillary: 121 mg/dL — ABNORMAL HIGH (ref 70–99)
Glucose-Capillary: 121 mg/dL — ABNORMAL HIGH (ref 70–99)
Glucose-Capillary: 126 mg/dL — ABNORMAL HIGH (ref 70–99)
Glucose-Capillary: 128 mg/dL — ABNORMAL HIGH (ref 70–99)
Glucose-Capillary: 134 mg/dL — ABNORMAL HIGH (ref 70–99)
Glucose-Capillary: 135 mg/dL — ABNORMAL HIGH (ref 70–99)
Glucose-Capillary: 137 mg/dL — ABNORMAL HIGH (ref 70–99)
Glucose-Capillary: 96 mg/dL (ref 70–99)
Glucose-Capillary: 100 mg/dL — ABNORMAL HIGH (ref 70–99)
Glucose-Capillary: 100 mg/dL — ABNORMAL HIGH (ref 70–99)
Glucose-Capillary: 102 mg/dL — ABNORMAL HIGH (ref 70–99)
Glucose-Capillary: 103 mg/dL — ABNORMAL HIGH (ref 70–99)
Glucose-Capillary: 108 mg/dL — ABNORMAL HIGH (ref 70–99)
Glucose-Capillary: 110 mg/dL — ABNORMAL HIGH (ref 70–99)
Glucose-Capillary: 111 mg/dL — ABNORMAL HIGH (ref 70–99)
Glucose-Capillary: 112 mg/dL — ABNORMAL HIGH (ref 70–99)
Glucose-Capillary: 112 mg/dL — ABNORMAL HIGH (ref 70–99)
Glucose-Capillary: 112 mg/dL — ABNORMAL HIGH (ref 70–99)
Glucose-Capillary: 113 mg/dL — ABNORMAL HIGH (ref 70–99)
Glucose-Capillary: 114 mg/dL — ABNORMAL HIGH (ref 70–99)
Glucose-Capillary: 114 mg/dL — ABNORMAL HIGH (ref 70–99)
Glucose-Capillary: 116 mg/dL — ABNORMAL HIGH (ref 70–99)
Glucose-Capillary: 118 mg/dL — ABNORMAL HIGH (ref 70–99)
Glucose-Capillary: 119 mg/dL — ABNORMAL HIGH (ref 70–99)
Glucose-Capillary: 123 mg/dL — ABNORMAL HIGH (ref 70–99)
Glucose-Capillary: 135 mg/dL — ABNORMAL HIGH (ref 70–99)
Glucose-Capillary: 138 mg/dL — ABNORMAL HIGH (ref 70–99)
Glucose-Capillary: 140 mg/dL — ABNORMAL HIGH (ref 70–99)
Glucose-Capillary: 140 mg/dL — ABNORMAL HIGH (ref 70–99)
Glucose-Capillary: 146 mg/dL — ABNORMAL HIGH (ref 70–99)
Glucose-Capillary: 147 mg/dL — ABNORMAL HIGH (ref 70–99)
Glucose-Capillary: 148 mg/dL — ABNORMAL HIGH (ref 70–99)
Glucose-Capillary: 148 mg/dL — ABNORMAL HIGH (ref 70–99)
Glucose-Capillary: 153 mg/dL — ABNORMAL HIGH (ref 70–99)
Glucose-Capillary: 155 mg/dL — ABNORMAL HIGH (ref 70–99)
Glucose-Capillary: 155 mg/dL — ABNORMAL HIGH (ref 70–99)
Glucose-Capillary: 157 mg/dL — ABNORMAL HIGH (ref 70–99)
Glucose-Capillary: 159 mg/dL — ABNORMAL HIGH (ref 70–99)
Glucose-Capillary: 197 mg/dL — ABNORMAL HIGH (ref 70–99)
Glucose-Capillary: 86 mg/dL (ref 70–99)
Glucose-Capillary: 88 mg/dL (ref 70–99)
Glucose-Capillary: 89 mg/dL (ref 70–99)
Glucose-Capillary: 89 mg/dL (ref 70–99)
Glucose-Capillary: 90 mg/dL (ref 70–99)
Glucose-Capillary: 90 mg/dL (ref 70–99)
Glucose-Capillary: 91 mg/dL (ref 70–99)

## 2011-01-19 LAB — PHOSPHORUS
Phosphorus: 3.3 mg/dL (ref 2.3–4.6)
Phosphorus: 3.7 mg/dL (ref 2.3–4.6)
Phosphorus: 3.8 mg/dL (ref 2.3–4.6)
Phosphorus: 4.1 mg/dL (ref 2.3–4.6)
Phosphorus: 4.4 mg/dL (ref 2.3–4.6)
Phosphorus: 4.7 mg/dL — ABNORMAL HIGH (ref 2.3–4.6)
Phosphorus: 4.8 mg/dL — ABNORMAL HIGH (ref 2.3–4.6)

## 2011-01-19 LAB — BASIC METABOLIC PANEL
BUN: 24 mg/dL — ABNORMAL HIGH (ref 6–23)
BUN: 26 mg/dL — ABNORMAL HIGH (ref 6–23)
CO2: 20 mEq/L (ref 19–32)
CO2: 22 mEq/L (ref 19–32)
CO2: 22 mEq/L (ref 19–32)
CO2: 22 mEq/L (ref 19–32)
Calcium: 8 mg/dL — ABNORMAL LOW (ref 8.4–10.5)
Calcium: 8.2 mg/dL — ABNORMAL LOW (ref 8.4–10.5)
Calcium: 8.2 mg/dL — ABNORMAL LOW (ref 8.4–10.5)
Calcium: 8.2 mg/dL — ABNORMAL LOW (ref 8.4–10.5)
Calcium: 8.3 mg/dL — ABNORMAL LOW (ref 8.4–10.5)
Calcium: 8.8 mg/dL (ref 8.4–10.5)
Chloride: 100 mEq/L (ref 96–112)
Chloride: 104 mEq/L (ref 96–112)
Chloride: 107 mEq/L (ref 96–112)
Chloride: 99 mEq/L (ref 96–112)
Creatinine, Ser: 0.65 mg/dL (ref 0.4–1.2)
Creatinine, Ser: 0.72 mg/dL (ref 0.4–1.2)
Creatinine, Ser: 0.74 mg/dL (ref 0.4–1.2)
Creatinine, Ser: 0.81 mg/dL (ref 0.4–1.2)
Creatinine, Ser: 0.83 mg/dL (ref 0.4–1.2)
GFR calc Af Amer: >60 mL/min (ref >60)
GFR calc Af Amer: >60 mL/min (ref >60)
GFR calc Af Amer: >60 mL/min (ref >60)
GFR calc Af Amer: >60 mL/min (ref >60)
GFR calc Af Amer: >60 mL/min (ref >60)
GFR calc Af Amer: >60 mL/min (ref >60)
GFR calc non Af Amer: >60 mL/min (ref >60)
GFR calc non Af Amer: >60 mL/min (ref >60)
GFR calc non Af Amer: >60 mL/min (ref >60)
GFR calc non Af Amer: >60 mL/min (ref >60)
Glucose, Bld: 133 mg/dL — ABNORMAL HIGH (ref 70–99)
Glucose, Bld: 168 mg/dL — ABNORMAL HIGH (ref 70–99)
Potassium: 3.5 mEq/L (ref 3.5–5.1)
Potassium: 3.9 mEq/L (ref 3.5–5.1)
Potassium: 4.3 mEq/L (ref 3.5–5.1)
Sodium: 127 mEq/L — ABNORMAL LOW (ref 135–145)
Sodium: 128 mEq/L — ABNORMAL LOW (ref 135–145)
Sodium: 128 mEq/L — ABNORMAL LOW (ref 135–145)
Sodium: 131 mEq/L — ABNORMAL LOW (ref 135–145)

## 2011-01-19 LAB — CULTURE, BLOOD (ROUTINE X 2)
Culture  Setup Time: 201112191845
Culture: NO GROWTH

## 2011-01-19 LAB — COMPREHENSIVE METABOLIC PANEL
ALT: 20 U/L (ref 0–35)
ALT: 21 U/L (ref 0–35)
ALT: 26 U/L (ref 0–35)
AST: 24 U/L (ref 0–37)
AST: 26 U/L (ref 0–37)
AST: 27 U/L (ref 0–37)
AST: 29 U/L (ref 0–37)
AST: 34 U/L (ref 0–37)
Albumin: 1.7 g/dL — ABNORMAL LOW (ref 3.5–5.2)
Albumin: 1.8 g/dL — ABNORMAL LOW (ref 3.5–5.2)
Albumin: 1.9 g/dL — ABNORMAL LOW (ref 3.5–5.2)
Albumin: 2 g/dL — ABNORMAL LOW (ref 3.5–5.2)
Alkaline Phosphatase: 134 U/L — ABNORMAL HIGH (ref 39–117)
Alkaline Phosphatase: 141 U/L — ABNORMAL HIGH (ref 39–117)
Alkaline Phosphatase: 155 U/L — ABNORMAL HIGH (ref 39–117)
Alkaline Phosphatase: 175 U/L — ABNORMAL HIGH (ref 39–117)
BUN: 27 mg/dL — ABNORMAL HIGH (ref 6–23)
BUN: 27 mg/dL — ABNORMAL HIGH (ref 6–23)
BUN: 28 mg/dL — ABNORMAL HIGH (ref 6–23)
BUN: 29 mg/dL — ABNORMAL HIGH (ref 6–23)
BUN: 30 mg/dL — ABNORMAL HIGH (ref 6–23)
CO2: 21 mEq/L (ref 19–32)
CO2: 21 mEq/L (ref 19–32)
CO2: 21 mEq/L (ref 19–32)
CO2: 22 mEq/L (ref 19–32)
CO2: 22 mEq/L (ref 19–32)
CO2: 24 mEq/L (ref 19–32)
Calcium: 7.9 mg/dL — ABNORMAL LOW (ref 8.4–10.5)
Calcium: 8 mg/dL — ABNORMAL LOW (ref 8.4–10.5)
Calcium: 8.5 mg/dL (ref 8.4–10.5)
Chloride: 100 mEq/L (ref 96–112)
Chloride: 103 mEq/L (ref 96–112)
Chloride: 105 mEq/L (ref 96–112)
Chloride: 108 mEq/L (ref 96–112)
Creatinine, Ser: 0.69 mg/dL (ref 0.4–1.2)
Creatinine, Ser: 0.7 mg/dL (ref 0.4–1.2)
Creatinine, Ser: 0.73 mg/dL (ref 0.4–1.2)
Creatinine, Ser: 0.79 mg/dL (ref 0.4–1.2)
GFR calc Af Amer: >60 mL/min (ref >60)
GFR calc Af Amer: >60 mL/min (ref >60)
GFR calc Af Amer: >60 mL/min (ref >60)
GFR calc Af Amer: >60 mL/min (ref >60)
GFR calc non Af Amer: >60 mL/min (ref >60)
GFR calc non Af Amer: >60 mL/min (ref >60)
GFR calc non Af Amer: >60 mL/min (ref >60)
GFR calc non Af Amer: >60 mL/min (ref >60)
GFR calc non Af Amer: >60 mL/min (ref >60)
Glucose, Bld: 122 mg/dL — ABNORMAL HIGH (ref 70–99)
Glucose, Bld: 174 mg/dL — ABNORMAL HIGH (ref 70–99)
Potassium: 3.9 mEq/L (ref 3.5–5.1)
Potassium: 4.1 mEq/L (ref 3.5–5.1)
Potassium: 4.4 mEq/L (ref 3.5–5.1)
Potassium: 4.4 mEq/L (ref 3.5–5.1)
Sodium: 127 mEq/L — ABNORMAL LOW (ref 135–145)
Sodium: 128 mEq/L — ABNORMAL LOW (ref 135–145)
Sodium: 132 mEq/L — ABNORMAL LOW (ref 135–145)
Total Bilirubin: 0.3 mg/dL (ref 0.3–1.2)
Total Bilirubin: 0.4 mg/dL (ref 0.3–1.2)
Total Bilirubin: 0.6 mg/dL (ref 0.3–1.2)
Total Bilirubin: 0.6 mg/dL (ref 0.3–1.2)
Total Bilirubin: 0.7 mg/dL (ref 0.3–1.2)
Total Protein: 5.2 g/dL — ABNORMAL LOW (ref 6.0–8.3)
Total Protein: 5.8 g/dL — ABNORMAL LOW (ref 6.0–8.3)

## 2011-01-19 LAB — URINALYSIS, MICROSCOPIC ONLY
Bilirubin Urine: NEGATIVE
Glucose, UA: NEGATIVE mg/dL
Hgb urine dipstick: NEGATIVE
Specific Gravity, Urine: 1.02 (ref 1.005–1.030)
Urobilinogen, UA: 0.2 mg/dL (ref 0.0–1.0)
pH: 5.5 (ref 5.0–8.0)

## 2011-01-19 LAB — TRIGLYCERIDES: Triglycerides: 97 mg/dL (ref <150)

## 2011-01-19 LAB — URINE CULTURE: Culture  Setup Time: 201112161439

## 2011-01-19 NOTE — Discharge Summary (Signed)
[name], [name] NO.:  [id number]  MEDICAL RECORD NO.:  [id number]           PATIENT TYPE:  I  LOCATION:  5122                         FACILITY:  [facility]  PHYSICIAN:  Ardeth Sportsman, MD     DATE OF BIRTH:  [birth year]  DATE OF ADMISSION:  09/13/2010 DATE OF DISCHARGE:  12/23/2010                              DISCHARGE SUMMARY   ADMITTING PHYSICIAN:  Vania Rea, MD  PRIMARY CARE PHYSICIAN:  [facility].  PRIMARY CARDIOLOGIST:  Bevelyn Buckles. Bensimhon, MD  CONSULTANTS: 1. Pricilla Riffle, MD, [facility] with Cardiology. 2. Ollen Moana Munford. Vernell Morgans, M.D. 3. Judie Petit T. Russella Dar, MD, [facility] with Gastroenterology, Critical Care     Medicine.  PROCEDURES: 1. The patient did have an ERCP with sphincterotomy on September 16, 2010. 2. Exploratory laparotomy with lysis of adhesions, kocherization of     the duodenum, duodenotomy with repair of duodenal perforation and     bleeding, open cholecystectomy, common bile duct exploration,     placement of T-tube by Dr. Chevis Pretty on September 17, 2010. 3. Exploratory laparotomy with lysis of adhesions, right colectomy,     and ileostomy by Dr. Violeta Gelinas on October 06, 2010.  Please note that this is a final discharge summary, however, there are multiple prior interim discharge summaries, so this will mostly cover the events leading from November 27, 2010, to date of discharge which is December 23, 2010.  HOSPITAL COURSE:  During this time, the patient remained surgically and medically stable.  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  She consistently had cloudy, thick, paint like drainage from this fistula.  The patient's T- tube remained open so that the bile could drain into that as opposed to going passed her fistula with the hopes of eventually healing this fistula.  Because this fistula remained open, the patient remained on TNA throughout her hospitalization up until  time of discharge.  At the time of discharge, the patient was having approximately 150 mL from her Eakin pouch on a daily basis along with anywhere from 300-600 mL of bilious drainage from her T-tube with approximately 75 mL from her ileostomy.  The patient did have an open abdominal wound which has healed at this point.  She does have a piece of suture that is sticking out from her skin that we have not cut yet as we did not want to mess up her wound.  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  Her stoma is pink and viable.  The Hospitalists have been managing the patient's medical issues.  Their help has been very much appreciated throughout the patient's hospitalization.  The patient does have a history of hypertension which has remained stable on IV Lopressor.  This has remained the same.  The patient does have a history of paroxysmal atrial fibrillation.  She has been normal sinus rhythm.  She is stable with this on metoprolol.  The patient also has asthma which has remained stable.  She is on Xopenex and Flovent as needed.  At this time, the patient is surgically and medically stable.  She is felt to be stable for discharge to skilled nursing facility to continue further medical improvement prior to discharge home.  FINAL DISCHARGE DIAGNOSES: 1. Choledocholithiasis status post endoscopic retrograde     cholangiopancreatography with sphincterotomy. 2. Status post exploratory laparotomy with duodenotomy and repair of a     duodenal perforation and bleeding, open cholecystectomy, common     bile duct exploration, and placement of a T-tube. 3. Status post exploratory laparotomy with right colectomy and     ileostomy. 4. Protein-calorie malnutrition requiring total nutrient admixture. 5. Duodenocutaneous fistula. 6. Bronchial asthma, stable. 7. Hypertension, stable. 8. Paroxysmal atrial fibrillation, stable. 9. Coronary artery disease, stable. 10.History of  non-ST-elevation myocardial infarction. 11.Mild transaminitis secondary to total nutrient admixture.  DISCHARGE MEDICATIONS:  Please see attached medication reconciliation form.  DISCHARGE INSTRUCTIONS:  The patient may increase her activity slowly and walk up steps.  She may not shower or bathe.  She needs to remain strict n.p.o. including no water, ice chips, or food.  She is to remain on TNA for nutrition secondary to her duodenocutaneous fistula.  Once this fistula begins to heal and she has no more drainage, then she may be able to eat.  In the meantime, the patient's T-tube needs to remain to gravity drainage.  This needs to be emptied as needed.  Her Eakin pouch will need to be changed every 4-5 days along with her ileostomy pouch.  Please keep track of the amount of output via the T-tube, Eakin pouch, and ileostomy pouch and please have available for return visits with the surgeon.  The patient is set up to return to see Dr. Chevis Pretty, her original surgeon on January 01, 2011, at 11:20 a.m.  Otherwise, she is encouraged to follow up with [facility] once discharged from the [facility].  She is also encouraged to follow up with Dr. Gala Romney, her cardiologist once discharged from the [facility] as well.     Letha Cape, PA   ______________________________ Ardeth Sportsman, MD    KEO/MEDQ  D:  12/23/2010  T:  12/23/2010  Job:  [id number]  cc:   Ollen Gimena Buick. Vernell Morgans, M.D. [facility] Bevelyn Buckles. Bensimhon, MD Venita Lick. Russella Dar, MD, [facility]  Electronically Signed by Barnetta Chapel PA on 01/02/2011 07:05:51 AM Electronically Signed by Karie Soda MD on 01/19/2011 11:57:05 AM

## 2011-01-20 LAB — IRON AND TIBC
Iron: 13 ug/dL — ABNORMAL LOW (ref 42–135)
Saturation Ratios: 8 % — ABNORMAL LOW (ref 20–55)
TIBC: 167 ug/dL — ABNORMAL LOW (ref 250–470)

## 2011-01-20 LAB — COMPREHENSIVE METABOLIC PANEL
ALT: 12 U/L (ref 0–35)
ALT: 13 U/L (ref 0–35)
ALT: 103 U/L — ABNORMAL HIGH (ref 0–35)
ALT: 160 U/L — ABNORMAL HIGH (ref 0–35)
ALT: 17 U/L (ref 0–35)
ALT: 198 U/L — ABNORMAL HIGH (ref 0–35)
ALT: 229 U/L — ABNORMAL HIGH (ref 0–35)
ALT: 23 U/L (ref 0–35)
ALT: 23 U/L (ref 0–35)
ALT: 243 U/L — ABNORMAL HIGH (ref 0–35)
ALT: 25 U/L (ref 0–35)
ALT: 25 U/L (ref 0–35)
ALT: 27 U/L (ref 0–35)
ALT: 34 U/L (ref 0–35)
ALT: 62 U/L — ABNORMAL HIGH (ref 0–35)
ALT: 75 U/L — ABNORMAL HIGH (ref 0–35)
AST: 17 U/L (ref 0–37)
AST: 17 U/L (ref 0–37)
AST: 24 U/L (ref 0–37)
AST: 100 U/L — ABNORMAL HIGH (ref 0–37)
AST: 17 U/L (ref 0–37)
AST: 26 U/L (ref 0–37)
AST: 27 U/L (ref 0–37)
AST: 28 U/L (ref 0–37)
AST: 29 U/L (ref 0–37)
AST: 30 U/L (ref 0–37)
AST: 302 U/L — ABNORMAL HIGH (ref 0–37)
AST: 331 U/L — ABNORMAL HIGH (ref 0–37)
AST: 34 U/L (ref 0–37)
AST: 72 U/L — ABNORMAL HIGH (ref 0–37)
AST: 87 U/L — ABNORMAL HIGH (ref 0–37)
Albumin: 1.3 g/dL — ABNORMAL LOW (ref 3.5–5.2)
Albumin: 1.5 g/dL — ABNORMAL LOW (ref 3.5–5.2)
Albumin: 1.6 g/dL — ABNORMAL LOW (ref 3.5–5.2)
Albumin: 1.9 g/dL — ABNORMAL LOW (ref 3.5–5.2)
Albumin: 1.5 g/dL — ABNORMAL LOW (ref 3.5–5.2)
Albumin: 1.7 g/dL — ABNORMAL LOW (ref 3.5–5.2)
Albumin: 1.7 g/dL — ABNORMAL LOW (ref 3.5–5.2)
Albumin: 1.7 g/dL — ABNORMAL LOW (ref 3.5–5.2)
Albumin: 1.8 g/dL — ABNORMAL LOW (ref 3.5–5.2)
Albumin: 1.8 g/dL — ABNORMAL LOW (ref 3.5–5.2)
Albumin: 1.8 g/dL — ABNORMAL LOW (ref 3.5–5.2)
Albumin: 1.8 g/dL — ABNORMAL LOW (ref 3.5–5.2)
Albumin: 2 g/dL — ABNORMAL LOW (ref 3.5–5.2)
Albumin: 2.8 g/dL — ABNORMAL LOW (ref 3.5–5.2)
Alkaline Phosphatase: 121 U/L — ABNORMAL HIGH (ref 39–117)
Alkaline Phosphatase: 131 U/L — ABNORMAL HIGH (ref 39–117)
Alkaline Phosphatase: 79 U/L (ref 39–117)
Alkaline Phosphatase: 118 U/L — ABNORMAL HIGH (ref 39–117)
Alkaline Phosphatase: 152 U/L — ABNORMAL HIGH (ref 39–117)
Alkaline Phosphatase: 157 U/L — ABNORMAL HIGH (ref 39–117)
Alkaline Phosphatase: 184 U/L — ABNORMAL HIGH (ref 39–117)
Alkaline Phosphatase: 193 U/L — ABNORMAL HIGH (ref 39–117)
Alkaline Phosphatase: 199 U/L — ABNORMAL HIGH (ref 39–117)
Alkaline Phosphatase: 210 U/L — ABNORMAL HIGH (ref 39–117)
Alkaline Phosphatase: 215 U/L — ABNORMAL HIGH (ref 39–117)
Alkaline Phosphatase: 244 U/L — ABNORMAL HIGH (ref 39–117)
Alkaline Phosphatase: 244 U/L — ABNORMAL HIGH (ref 39–117)
Alkaline Phosphatase: 62 U/L (ref 39–117)
Alkaline Phosphatase: 91 U/L (ref 39–117)
Alkaline Phosphatase: 94 U/L (ref 39–117)
BUN: 16 mg/dL (ref 6–23)
BUN: 23 mg/dL (ref 6–23)
BUN: 14 mg/dL (ref 6–23)
BUN: 16 mg/dL (ref 6–23)
BUN: 17 mg/dL (ref 6–23)
BUN: 17 mg/dL (ref 6–23)
BUN: 21 mg/dL (ref 6–23)
BUN: 29 mg/dL — ABNORMAL HIGH (ref 6–23)
BUN: 5 mg/dL — ABNORMAL LOW (ref 6–23)
BUN: 9 mg/dL (ref 6–23)
CO2: 22 mEq/L (ref 19–32)
CO2: 20 mEq/L (ref 19–32)
CO2: 23 mEq/L (ref 19–32)
CO2: 23 mEq/L (ref 19–32)
CO2: 24 mEq/L (ref 19–32)
CO2: 25 mEq/L (ref 19–32)
CO2: 25 mEq/L (ref 19–32)
CO2: 25 mEq/L (ref 19–32)
CO2: 26 mEq/L (ref 19–32)
CO2: 26 mEq/L (ref 19–32)
CO2: 31 mEq/L (ref 19–32)
Calcium: 7 mg/dL — ABNORMAL LOW (ref 8.4–10.5)
Calcium: 7.1 mg/dL — ABNORMAL LOW (ref 8.4–10.5)
Calcium: 7.2 mg/dL — ABNORMAL LOW (ref 8.4–10.5)
Calcium: 7.8 mg/dL — ABNORMAL LOW (ref 8.4–10.5)
Calcium: 7.9 mg/dL — ABNORMAL LOW (ref 8.4–10.5)
Calcium: 7.9 mg/dL — ABNORMAL LOW (ref 8.4–10.5)
Calcium: 8 mg/dL — ABNORMAL LOW (ref 8.4–10.5)
Calcium: 8.6 mg/dL (ref 8.4–10.5)
Calcium: 8.9 mg/dL (ref 8.4–10.5)
Chloride: 100 mEq/L (ref 96–112)
Chloride: 101 mEq/L (ref 96–112)
Chloride: 98 mEq/L (ref 96–112)
Chloride: 104 mEq/L (ref 96–112)
Chloride: 105 mEq/L (ref 96–112)
Chloride: 107 mEq/L (ref 96–112)
Chloride: 108 mEq/L (ref 96–112)
Chloride: 112 mEq/L (ref 96–112)
Chloride: 115 mEq/L — ABNORMAL HIGH (ref 96–112)
Chloride: 119 mEq/L — ABNORMAL HIGH (ref 96–112)
Chloride: 121 mEq/L — ABNORMAL HIGH (ref 96–112)
Chloride: 92 mEq/L — ABNORMAL LOW (ref 96–112)
Chloride: 95 mEq/L — ABNORMAL LOW (ref 96–112)
Chloride: 99 mEq/L (ref 96–112)
Creatinine, Ser: 0.84 mg/dL (ref 0.4–1.2)
Creatinine, Ser: 0.96 mg/dL (ref 0.4–1.2)
Creatinine, Ser: 0.85 mg/dL (ref 0.4–1.2)
Creatinine, Ser: 1.04 mg/dL (ref 0.4–1.2)
Creatinine, Ser: 1.05 mg/dL (ref 0.4–1.2)
Creatinine, Ser: 1.08 mg/dL (ref 0.4–1.2)
Creatinine, Ser: 1.23 mg/dL — ABNORMAL HIGH (ref 0.4–1.2)
Creatinine, Ser: 1.33 mg/dL — ABNORMAL HIGH (ref 0.4–1.2)
Creatinine, Ser: 1.88 mg/dL — ABNORMAL HIGH (ref 0.4–1.2)
Creatinine, Ser: 2.33 mg/dL — ABNORMAL HIGH (ref 0.4–1.2)
GFR calc Af Amer: >60 mL/min (ref >60)
GFR calc Af Amer: >60 mL/min (ref >60)
GFR calc Af Amer: >60 mL/min (ref >60)
GFR calc Af Amer: 48 mL/min — ABNORMAL LOW (ref >60)
GFR calc Af Amer: 53 mL/min — ABNORMAL LOW (ref >60)
GFR calc Af Amer: 60 mL/min — ABNORMAL LOW (ref >60)
GFR calc Af Amer: >60 mL/min (ref >60)
GFR calc Af Amer: >60 mL/min (ref >60)
GFR calc Af Amer: >60 mL/min (ref >60)
GFR calc Af Amer: >60 mL/min (ref >60)
GFR calc Af Amer: >60 mL/min (ref >60)
GFR calc Af Amer: >60 mL/min (ref >60)
GFR calc Af Amer: >60 mL/min (ref >60)
GFR calc Af Amer: >60 mL/min (ref >60)
GFR calc non Af Amer: 58 mL/min — ABNORMAL LOW (ref >60)
GFR calc non Af Amer: 21 mL/min — ABNORMAL LOW (ref >60)
GFR calc non Af Amer: 28 mL/min — ABNORMAL LOW (ref >60)
GFR calc non Af Amer: 43 mL/min — ABNORMAL LOW (ref >60)
GFR calc non Af Amer: 44 mL/min — ABNORMAL LOW (ref >60)
GFR calc non Af Amer: 51 mL/min — ABNORMAL LOW (ref >60)
GFR calc non Af Amer: 51 mL/min — ABNORMAL LOW (ref >60)
GFR calc non Af Amer: 52 mL/min — ABNORMAL LOW (ref >60)
GFR calc non Af Amer: >60 mL/min (ref >60)
GFR calc non Af Amer: >60 mL/min (ref >60)
Glucose, Bld: 101 mg/dL — ABNORMAL HIGH (ref 70–99)
Glucose, Bld: 102 mg/dL — ABNORMAL HIGH (ref 70–99)
Glucose, Bld: 111 mg/dL — ABNORMAL HIGH (ref 70–99)
Glucose, Bld: 118 mg/dL — ABNORMAL HIGH (ref 70–99)
Glucose, Bld: 124 mg/dL — ABNORMAL HIGH (ref 70–99)
Glucose, Bld: 135 mg/dL — ABNORMAL HIGH (ref 70–99)
Glucose, Bld: 136 mg/dL — ABNORMAL HIGH (ref 70–99)
Glucose, Bld: 136 mg/dL — ABNORMAL HIGH (ref 70–99)
Glucose, Bld: 180 mg/dL — ABNORMAL HIGH (ref 70–99)
Glucose, Bld: 93 mg/dL (ref 70–99)
Glucose, Bld: 96 mg/dL (ref 70–99)
Potassium: 3.6 mEq/L (ref 3.5–5.1)
Potassium: 3.6 mEq/L (ref 3.5–5.1)
Potassium: 3.9 mEq/L (ref 3.5–5.1)
Potassium: 4 mEq/L (ref 3.5–5.1)
Potassium: 3.2 mEq/L — ABNORMAL LOW (ref 3.5–5.1)
Potassium: 3.2 mEq/L — ABNORMAL LOW (ref 3.5–5.1)
Potassium: 3.3 mEq/L — ABNORMAL LOW (ref 3.5–5.1)
Potassium: 3.4 mEq/L — ABNORMAL LOW (ref 3.5–5.1)
Potassium: 3.6 mEq/L (ref 3.5–5.1)
Potassium: 3.7 mEq/L (ref 3.5–5.1)
Potassium: 3.7 mEq/L (ref 3.5–5.1)
Potassium: 3.8 mEq/L (ref 3.5–5.1)
Potassium: 4 mEq/L (ref 3.5–5.1)
Potassium: 4 mEq/L (ref 3.5–5.1)
Potassium: 4 mEq/L (ref 3.5–5.1)
Potassium: 4.2 mEq/L (ref 3.5–5.1)
Potassium: 4.3 mEq/L (ref 3.5–5.1)
Potassium: 4.8 mEq/L (ref 3.5–5.1)
Potassium: 4.9 mEq/L (ref 3.5–5.1)
Sodium: 128 mEq/L — ABNORMAL LOW (ref 135–145)
Sodium: 133 mEq/L — ABNORMAL LOW (ref 135–145)
Sodium: 127 mEq/L — ABNORMAL LOW (ref 135–145)
Sodium: 129 mEq/L — ABNORMAL LOW (ref 135–145)
Sodium: 131 mEq/L — ABNORMAL LOW (ref 135–145)
Sodium: 134 mEq/L — ABNORMAL LOW (ref 135–145)
Sodium: 134 mEq/L — ABNORMAL LOW (ref 135–145)
Sodium: 136 mEq/L (ref 135–145)
Sodium: 136 mEq/L (ref 135–145)
Sodium: 137 mEq/L (ref 135–145)
Sodium: 137 mEq/L (ref 135–145)
Sodium: 138 mEq/L (ref 135–145)
Sodium: 141 mEq/L (ref 135–145)
Sodium: 142 mEq/L (ref 135–145)
Sodium: 148 mEq/L — ABNORMAL HIGH (ref 135–145)
Total Bilirubin: 0.6 mg/dL (ref 0.3–1.2)
Total Bilirubin: 1.1 mg/dL (ref 0.3–1.2)
Total Bilirubin: 0.9 mg/dL (ref 0.3–1.2)
Total Bilirubin: 1.1 mg/dL (ref 0.3–1.2)
Total Bilirubin: 1.4 mg/dL — ABNORMAL HIGH (ref 0.3–1.2)
Total Bilirubin: 2 mg/dL — ABNORMAL HIGH (ref 0.3–1.2)
Total Bilirubin: 2.4 mg/dL — ABNORMAL HIGH (ref 0.3–1.2)
Total Bilirubin: 3.1 mg/dL — ABNORMAL HIGH (ref 0.3–1.2)
Total Bilirubin: 3.2 mg/dL — ABNORMAL HIGH (ref 0.3–1.2)
Total Bilirubin: 3.3 mg/dL — ABNORMAL HIGH (ref 0.3–1.2)
Total Bilirubin: 4.1 mg/dL — ABNORMAL HIGH (ref 0.3–1.2)
Total Bilirubin: 4.7 mg/dL — ABNORMAL HIGH (ref 0.3–1.2)
Total Protein: 4 g/dL — ABNORMAL LOW (ref 6.0–8.3)
Total Protein: 5.4 g/dL — ABNORMAL LOW (ref 6.0–8.3)
Total Protein: 3.4 g/dL — ABNORMAL LOW (ref 6.0–8.3)
Total Protein: 3.5 g/dL — ABNORMAL LOW (ref 6.0–8.3)
Total Protein: 3.6 g/dL — ABNORMAL LOW (ref 6.0–8.3)
Total Protein: 3.6 g/dL — ABNORMAL LOW (ref 6.0–8.3)
Total Protein: 4.1 g/dL — ABNORMAL LOW (ref 6.0–8.3)
Total Protein: 5 g/dL — ABNORMAL LOW (ref 6.0–8.3)
Total Protein: 5.3 g/dL — ABNORMAL LOW (ref 6.0–8.3)
Total Protein: 5.6 g/dL — ABNORMAL LOW (ref 6.0–8.3)
Total Protein: 5.6 g/dL — ABNORMAL LOW (ref 6.0–8.3)
Total Protein: 5.7 g/dL — ABNORMAL LOW (ref 6.0–8.3)
Total Protein: 5.8 g/dL — ABNORMAL LOW (ref 6.0–8.3)
Total Protein: 6.1 g/dL (ref 6.0–8.3)
Total Protein: 7.1 g/dL (ref 6.0–8.3)

## 2011-01-20 LAB — CBC
HCT: 31.6 % — ABNORMAL LOW (ref 36.0–46.0)
HCT: 34 % — ABNORMAL LOW (ref 36.0–46.0)
HCT: 18.6 % — ABNORMAL LOW (ref 36.0–46.0)
HCT: 20.7 % — ABNORMAL LOW (ref 36.0–46.0)
HCT: 21.4 % — ABNORMAL LOW (ref 36.0–46.0)
HCT: 21.9 % — ABNORMAL LOW (ref 36.0–46.0)
HCT: 22.6 % — ABNORMAL LOW (ref 36.0–46.0)
HCT: 22.6 % — ABNORMAL LOW (ref 36.0–46.0)
HCT: 22.7 % — ABNORMAL LOW (ref 36.0–46.0)
HCT: 22.8 % — ABNORMAL LOW (ref 36.0–46.0)
HCT: 22.8 % — ABNORMAL LOW (ref 36.0–46.0)
HCT: 24.3 % — ABNORMAL LOW (ref 36.0–46.0)
HCT: 24.6 % — ABNORMAL LOW (ref 36.0–46.0)
HCT: 24.8 % — ABNORMAL LOW (ref 36.0–46.0)
HCT: 25.2 % — ABNORMAL LOW (ref 36.0–46.0)
HCT: 25.3 % — ABNORMAL LOW (ref 36.0–46.0)
HCT: 26.4 % — ABNORMAL LOW (ref 36.0–46.0)
HCT: 26.5 % — ABNORMAL LOW (ref 36.0–46.0)
HCT: 28.1 % — ABNORMAL LOW (ref 36.0–46.0)
HCT: 29.8 % — ABNORMAL LOW (ref 36.0–46.0)
HCT: 29.8 % — ABNORMAL LOW (ref 36.0–46.0)
HCT: 31 % — ABNORMAL LOW (ref 36.0–46.0)
HCT: 31.5 % — ABNORMAL LOW (ref 36.0–46.0)
HCT: 34.2 % — ABNORMAL LOW (ref 36.0–46.0)
HCT: 43.1 % (ref 36.0–46.0)
Hemoglobin: 10.5 g/dL — ABNORMAL LOW (ref 12.0–15.0)
Hemoglobin: 11 g/dL — ABNORMAL LOW (ref 12.0–15.0)
Hemoglobin: 11.1 g/dL — ABNORMAL LOW (ref 12.0–15.0)
Hemoglobin: 10 g/dL — ABNORMAL LOW (ref 12.0–15.0)
Hemoglobin: 10.2 g/dL — ABNORMAL LOW (ref 12.0–15.0)
Hemoglobin: 10.2 g/dL — ABNORMAL LOW (ref 12.0–15.0)
Hemoglobin: 10.2 g/dL — ABNORMAL LOW (ref 12.0–15.0)
Hemoglobin: 11 g/dL — ABNORMAL LOW (ref 12.0–15.0)
Hemoglobin: 11.3 g/dL — ABNORMAL LOW (ref 12.0–15.0)
Hemoglobin: 15 g/dL (ref 12.0–15.0)
Hemoglobin: 15.1 g/dL — ABNORMAL HIGH (ref 12.0–15.0)
Hemoglobin: 6 g/dL — CL (ref 12.0–15.0)
Hemoglobin: 7.3 g/dL — ABNORMAL LOW (ref 12.0–15.0)
Hemoglobin: 7.4 g/dL — ABNORMAL LOW (ref 12.0–15.0)
Hemoglobin: 7.4 g/dL — ABNORMAL LOW (ref 12.0–15.0)
Hemoglobin: 7.4 g/dL — ABNORMAL LOW (ref 12.0–15.0)
Hemoglobin: 7.4 g/dL — ABNORMAL LOW (ref 12.0–15.0)
Hemoglobin: 7.6 g/dL — ABNORMAL LOW (ref 12.0–15.0)
Hemoglobin: 7.7 g/dL — ABNORMAL LOW (ref 12.0–15.0)
Hemoglobin: 8.2 g/dL — ABNORMAL LOW (ref 12.0–15.0)
Hemoglobin: 8.3 g/dL — ABNORMAL LOW (ref 12.0–15.0)
Hemoglobin: 8.4 g/dL — ABNORMAL LOW (ref 12.0–15.0)
Hemoglobin: 8.5 g/dL — ABNORMAL LOW (ref 12.0–15.0)
Hemoglobin: 8.5 g/dL — ABNORMAL LOW (ref 12.0–15.0)
Hemoglobin: 8.9 g/dL — ABNORMAL LOW (ref 12.0–15.0)
Hemoglobin: 9.1 g/dL — ABNORMAL LOW (ref 12.0–15.0)
Hemoglobin: 9.6 g/dL — ABNORMAL LOW (ref 12.0–15.0)
MCH: 29.3 pg (ref 26.0–34.0)
MCH: 30.3 pg (ref 26.0–34.0)
MCH: 30.4 pg (ref 26.0–34.0)
MCH: 27.9 pg (ref 26.0–34.0)
MCH: 28.5 pg (ref 26.0–34.0)
MCH: 28.7 pg (ref 26.0–34.0)
MCH: 28.7 pg (ref 26.0–34.0)
MCH: 28.8 pg (ref 26.0–34.0)
MCH: 28.9 pg (ref 26.0–34.0)
MCH: 28.9 pg (ref 26.0–34.0)
MCH: 28.9 pg (ref 26.0–34.0)
MCH: 29 pg (ref 26.0–34.0)
MCH: 29.1 pg (ref 26.0–34.0)
MCH: 29.1 pg (ref 26.0–34.0)
MCH: 29.2 pg (ref 26.0–34.0)
MCH: 29.2 pg (ref 26.0–34.0)
MCH: 29.2 pg (ref 26.0–34.0)
MCH: 29.6 pg (ref 26.0–34.0)
MCH: 29.7 pg (ref 26.0–34.0)
MCH: 29.7 pg (ref 26.0–34.0)
MCH: 29.7 pg (ref 26.0–34.0)
MCH: 30 pg (ref 26.0–34.0)
MCH: 30.1 pg (ref 26.0–34.0)
MCH: 30.4 pg (ref 26.0–34.0)
MCHC: 33.1 g/dL (ref 30.0–36.0)
MCHC: 33.2 g/dL (ref 30.0–36.0)
MCHC: 33.2 g/dL (ref 30.0–36.0)
MCHC: 33.6 g/dL (ref 30.0–36.0)
MCHC: 33.7 g/dL (ref 30.0–36.0)
MCHC: 33.8 g/dL (ref 30.0–36.0)
MCHC: 32.2 g/dL (ref 30.0–36.0)
MCHC: 32.3 g/dL (ref 30.0–36.0)
MCHC: 32.5 g/dL (ref 30.0–36.0)
MCHC: 32.6 g/dL (ref 30.0–36.0)
MCHC: 32.7 g/dL (ref 30.0–36.0)
MCHC: 32.7 g/dL (ref 30.0–36.0)
MCHC: 32.9 g/dL (ref 30.0–36.0)
MCHC: 33 g/dL (ref 30.0–36.0)
MCHC: 33.3 g/dL (ref 30.0–36.0)
MCHC: 33.5 g/dL (ref 30.0–36.0)
MCHC: 33.6 g/dL (ref 30.0–36.0)
MCHC: 33.6 g/dL (ref 30.0–36.0)
MCHC: 33.8 g/dL (ref 30.0–36.0)
MCHC: 34.1 g/dL (ref 30.0–36.0)
MCHC: 34.2 g/dL (ref 30.0–36.0)
MCHC: 34.2 g/dL (ref 30.0–36.0)
MCHC: 34.2 g/dL (ref 30.0–36.0)
MCHC: 34.3 g/dL (ref 30.0–36.0)
MCHC: 34.4 g/dL (ref 30.0–36.0)
MCHC: 34.5 g/dL (ref 30.0–36.0)
MCV: 86.5 fL (ref 78.0–100.0)
MCV: 87.4 fL (ref 78.0–100.0)
MCV: 90.3 fL (ref 78.0–100.0)
MCV: 83.9 fL (ref 78.0–100.0)
MCV: 84.8 fL (ref 78.0–100.0)
MCV: 85.2 fL (ref 78.0–100.0)
MCV: 85.3 fL (ref 78.0–100.0)
MCV: 85.6 fL (ref 78.0–100.0)
MCV: 86.2 fL (ref 78.0–100.0)
MCV: 86.3 fL (ref 78.0–100.0)
MCV: 86.4 fL (ref 78.0–100.0)
MCV: 86.6 fL (ref 78.0–100.0)
MCV: 86.8 fL (ref 78.0–100.0)
MCV: 86.8 fL (ref 78.0–100.0)
MCV: 87 fL (ref 78.0–100.0)
MCV: 87.4 fL (ref 78.0–100.0)
MCV: 88 fL (ref 78.0–100.0)
MCV: 88 fL (ref 78.0–100.0)
MCV: 88 fL (ref 78.0–100.0)
MCV: 88.1 fL (ref 78.0–100.0)
MCV: 88.2 fL (ref 78.0–100.0)
MCV: 88.3 fL (ref 78.0–100.0)
MCV: 88.3 fL (ref 78.0–100.0)
MCV: 88.3 fL (ref 78.0–100.0)
MCV: 88.4 fL (ref 78.0–100.0)
MCV: 89.4 fL (ref 78.0–100.0)
MCV: 89.7 fL (ref 78.0–100.0)
Platelets: 272 10*3/uL (ref 150–400)
Platelets: 276 10*3/uL (ref 150–400)
Platelets: 277 10*3/uL (ref 150–400)
Platelets: 285 10*3/uL (ref 150–400)
Platelets: 304 10*3/uL (ref 150–400)
Platelets: 344 10*3/uL (ref 150–400)
Platelets: 103 10*3/uL — ABNORMAL LOW (ref 150–400)
Platelets: 109 10*3/uL — ABNORMAL LOW (ref 150–400)
Platelets: 121 10*3/uL — ABNORMAL LOW (ref 150–400)
Platelets: 127 10*3/uL — ABNORMAL LOW (ref 150–400)
Platelets: 136 10*3/uL — ABNORMAL LOW (ref 150–400)
Platelets: 139 10*3/uL — ABNORMAL LOW (ref 150–400)
Platelets: 164 10*3/uL (ref 150–400)
Platelets: 178 10*3/uL (ref 150–400)
Platelets: 179 10*3/uL (ref 150–400)
Platelets: 215 10*3/uL (ref 150–400)
Platelets: 235 10*3/uL (ref 150–400)
Platelets: 274 10*3/uL (ref 150–400)
Platelets: 281 10*3/uL (ref 150–400)
Platelets: 329 10*3/uL (ref 150–400)
Platelets: 408 10*3/uL — ABNORMAL HIGH (ref 150–400)
Platelets: 440 10*3/uL — ABNORMAL HIGH (ref 150–400)
RBC: 3.49 MIL/uL — ABNORMAL LOW (ref 3.87–5.11)
RBC: 3.59 MIL/uL — ABNORMAL LOW (ref 3.87–5.11)
RBC: 3.71 MIL/uL — ABNORMAL LOW (ref 3.87–5.11)
RBC: 3.89 MIL/uL (ref 3.87–5.11)
RBC: 2.15 MIL/uL — ABNORMAL LOW (ref 3.87–5.11)
RBC: 2.5 MIL/uL — ABNORMAL LOW (ref 3.87–5.11)
RBC: 2.51 MIL/uL — ABNORMAL LOW (ref 3.87–5.11)
RBC: 2.52 MIL/uL — ABNORMAL LOW (ref 3.87–5.11)
RBC: 2.53 MIL/uL — ABNORMAL LOW (ref 3.87–5.11)
RBC: 2.54 MIL/uL — ABNORMAL LOW (ref 3.87–5.11)
RBC: 2.61 MIL/uL — ABNORMAL LOW (ref 3.87–5.11)
RBC: 2.76 MIL/uL — ABNORMAL LOW (ref 3.87–5.11)
RBC: 2.84 MIL/uL — ABNORMAL LOW (ref 3.87–5.11)
RBC: 2.84 MIL/uL — ABNORMAL LOW (ref 3.87–5.11)
RBC: 2.86 MIL/uL — ABNORMAL LOW (ref 3.87–5.11)
RBC: 2.87 MIL/uL — ABNORMAL LOW (ref 3.87–5.11)
RBC: 3.04 MIL/uL — ABNORMAL LOW (ref 3.87–5.11)
RBC: 3.53 MIL/uL — ABNORMAL LOW (ref 3.87–5.11)
RBC: 3.61 MIL/uL — ABNORMAL LOW (ref 3.87–5.11)
RBC: 3.65 MIL/uL — ABNORMAL LOW (ref 3.87–5.11)
RBC: 3.8 MIL/uL — ABNORMAL LOW (ref 3.87–5.11)
RBC: 4.05 MIL/uL (ref 3.87–5.11)
RBC: 4.99 MIL/uL (ref 3.87–5.11)
RBC: 5.01 MIL/uL (ref 3.87–5.11)
RBC: 5.07 MIL/uL (ref 3.87–5.11)
RDW: 14 % (ref 11.5–15.5)
RDW: 14.3 % (ref 11.5–15.5)
RDW: 14.5 % (ref 11.5–15.5)
RDW: 14.5 % (ref 11.5–15.5)
RDW: 14.5 % (ref 11.5–15.5)
RDW: 15 % (ref 11.5–15.5)
RDW: 12.9 % (ref 11.5–15.5)
RDW: 13.6 % (ref 11.5–15.5)
RDW: 13.8 % (ref 11.5–15.5)
RDW: 14 % (ref 11.5–15.5)
RDW: 14 % (ref 11.5–15.5)
RDW: 14.1 % (ref 11.5–15.5)
RDW: 14.1 % (ref 11.5–15.5)
RDW: 14.1 % (ref 11.5–15.5)
RDW: 14.2 % (ref 11.5–15.5)
RDW: 14.4 % (ref 11.5–15.5)
RDW: 14.5 % (ref 11.5–15.5)
RDW: 14.5 % (ref 11.5–15.5)
RDW: 14.6 % (ref 11.5–15.5)
RDW: 14.6 % (ref 11.5–15.5)
RDW: 14.7 % (ref 11.5–15.5)
RDW: 14.7 % (ref 11.5–15.5)
RDW: 15.1 % (ref 11.5–15.5)
RDW: 15.2 % (ref 11.5–15.5)
RDW: 15.2 % (ref 11.5–15.5)
RDW: 15.4 % (ref 11.5–15.5)
WBC: 12 10*3/uL — ABNORMAL HIGH (ref 4.0–10.5)
WBC: 12.3 10*3/uL — ABNORMAL HIGH (ref 4.0–10.5)
WBC: 13.3 10*3/uL — ABNORMAL HIGH (ref 4.0–10.5)
WBC: 7.7 10*3/uL (ref 4.0–10.5)
WBC: 8.9 10*3/uL (ref 4.0–10.5)
WBC: 9 10*3/uL (ref 4.0–10.5)
WBC: 10.1 10*3/uL (ref 4.0–10.5)
WBC: 12.4 10*3/uL — ABNORMAL HIGH (ref 4.0–10.5)
WBC: 13.1 10*3/uL — ABNORMAL HIGH (ref 4.0–10.5)
WBC: 14.5 10*3/uL — ABNORMAL HIGH (ref 4.0–10.5)
WBC: 14.7 10*3/uL — ABNORMAL HIGH (ref 4.0–10.5)
WBC: 15.8 10*3/uL — ABNORMAL HIGH (ref 4.0–10.5)
WBC: 15.9 10*3/uL — ABNORMAL HIGH (ref 4.0–10.5)
WBC: 17.8 10*3/uL — ABNORMAL HIGH (ref 4.0–10.5)
WBC: 18.2 10*3/uL — ABNORMAL HIGH (ref 4.0–10.5)
WBC: 19.3 10*3/uL — ABNORMAL HIGH (ref 4.0–10.5)
WBC: 19.5 10*3/uL — ABNORMAL HIGH (ref 4.0–10.5)
WBC: 3.7 10*3/uL — ABNORMAL LOW (ref 4.0–10.5)
WBC: 4.1 10*3/uL (ref 4.0–10.5)
WBC: 6.7 10*3/uL (ref 4.0–10.5)
WBC: 7.2 10*3/uL (ref 4.0–10.5)
WBC: 7.4 10*3/uL (ref 4.0–10.5)
WBC: 7.9 10*3/uL (ref 4.0–10.5)
WBC: 8.3 10*3/uL (ref 4.0–10.5)
WBC: 8.7 10*3/uL (ref 4.0–10.5)
WBC: 9.6 10*3/uL (ref 4.0–10.5)

## 2011-01-20 LAB — BASIC METABOLIC PANEL
BUN: 16 mg/dL (ref 6–23)
BUN: 18 mg/dL (ref 6–23)
BUN: 25 mg/dL — ABNORMAL HIGH (ref 6–23)
BUN: 27 mg/dL — ABNORMAL HIGH (ref 6–23)
BUN: 17 mg/dL (ref 6–23)
BUN: 23 mg/dL (ref 6–23)
BUN: 32 mg/dL — ABNORMAL HIGH (ref 6–23)
BUN: 7 mg/dL (ref 6–23)
CO2: 22 mEq/L (ref 19–32)
CO2: 24 mEq/L (ref 19–32)
CO2: 25 mEq/L (ref 19–32)
CO2: 26 mEq/L (ref 19–32)
CO2: 22 mEq/L (ref 19–32)
CO2: 22 mEq/L (ref 19–32)
CO2: 24 mEq/L (ref 19–32)
CO2: 24 mEq/L (ref 19–32)
CO2: 26 mEq/L (ref 19–32)
CO2: 26 mEq/L (ref 19–32)
CO2: 26 mEq/L (ref 19–32)
CO2: 27 mEq/L (ref 19–32)
Calcium: 7.3 mg/dL — ABNORMAL LOW (ref 8.4–10.5)
Calcium: 7.5 mg/dL — ABNORMAL LOW (ref 8.4–10.5)
Calcium: 7.9 mg/dL — ABNORMAL LOW (ref 8.4–10.5)
Calcium: 7.9 mg/dL — ABNORMAL LOW (ref 8.4–10.5)
Calcium: 7.1 mg/dL — ABNORMAL LOW (ref 8.4–10.5)
Calcium: 7.1 mg/dL — ABNORMAL LOW (ref 8.4–10.5)
Calcium: 7.9 mg/dL — ABNORMAL LOW (ref 8.4–10.5)
Calcium: 7.9 mg/dL — ABNORMAL LOW (ref 8.4–10.5)
Chloride: 101 mEq/L (ref 96–112)
Chloride: 96 mEq/L (ref 96–112)
Chloride: 97 mEq/L (ref 96–112)
Chloride: 98 mEq/L (ref 96–112)
Chloride: 103 mEq/L (ref 96–112)
Chloride: 104 mEq/L (ref 96–112)
Chloride: 105 mEq/L (ref 96–112)
Chloride: 107 mEq/L (ref 96–112)
Chloride: 96 mEq/L (ref 96–112)
Creatinine, Ser: 0.83 mg/dL (ref 0.4–1.2)
Creatinine, Ser: 0.96 mg/dL (ref 0.4–1.2)
Creatinine, Ser: 1.01 mg/dL (ref 0.4–1.2)
Creatinine, Ser: 1.08 mg/dL (ref 0.4–1.2)
Creatinine, Ser: 1.11 mg/dL (ref 0.4–1.2)
Creatinine, Ser: 1.11 mg/dL (ref 0.4–1.2)
Creatinine, Ser: 0.95 mg/dL (ref 0.4–1.2)
Creatinine, Ser: 1.05 mg/dL (ref 0.4–1.2)
GFR calc Af Amer: 59 mL/min — ABNORMAL LOW (ref >60)
GFR calc Af Amer: 59 mL/min — ABNORMAL LOW (ref >60)
GFR calc Af Amer: >60 mL/min (ref >60)
GFR calc Af Amer: 46 mL/min — ABNORMAL LOW (ref >60)
GFR calc Af Amer: 51 mL/min — ABNORMAL LOW (ref >60)
GFR calc Af Amer: >60 mL/min (ref >60)
GFR calc Af Amer: >60 mL/min (ref >60)
GFR calc Af Amer: >60 mL/min (ref >60)
GFR calc Af Amer: >60 mL/min (ref >60)
GFR calc non Af Amer: 49 mL/min — ABNORMAL LOW (ref >60)
GFR calc non Af Amer: 55 mL/min — ABNORMAL LOW (ref >60)
GFR calc non Af Amer: >60 mL/min (ref >60)
GFR calc non Af Amer: >60 mL/min (ref >60)
GFR calc non Af Amer: 38 mL/min — ABNORMAL LOW (ref >60)
GFR calc non Af Amer: 42 mL/min — ABNORMAL LOW (ref >60)
GFR calc non Af Amer: 43 mL/min — ABNORMAL LOW (ref >60)
GFR calc non Af Amer: >60 mL/min (ref >60)
Glucose, Bld: 116 mg/dL — ABNORMAL HIGH (ref 70–99)
Glucose, Bld: 126 mg/dL — ABNORMAL HIGH (ref 70–99)
Glucose, Bld: 127 mg/dL — ABNORMAL HIGH (ref 70–99)
Glucose, Bld: 132 mg/dL — ABNORMAL HIGH (ref 70–99)
Glucose, Bld: 122 mg/dL — ABNORMAL HIGH (ref 70–99)
Glucose, Bld: 124 mg/dL — ABNORMAL HIGH (ref 70–99)
Glucose, Bld: 128 mg/dL — ABNORMAL HIGH (ref 70–99)
Glucose, Bld: 129 mg/dL — ABNORMAL HIGH (ref 70–99)
Glucose, Bld: 142 mg/dL — ABNORMAL HIGH (ref 70–99)
Glucose, Bld: 92 mg/dL (ref 70–99)
Potassium: 3.6 mEq/L (ref 3.5–5.1)
Potassium: 3.6 mEq/L (ref 3.5–5.1)
Potassium: 3.9 mEq/L (ref 3.5–5.1)
Potassium: 3.6 mEq/L (ref 3.5–5.1)
Potassium: 4 mEq/L (ref 3.5–5.1)
Potassium: 4 mEq/L (ref 3.5–5.1)
Potassium: 4.1 mEq/L (ref 3.5–5.1)
Sodium: 129 mEq/L — ABNORMAL LOW (ref 135–145)
Sodium: 130 mEq/L — ABNORMAL LOW (ref 135–145)
Sodium: 131 mEq/L — ABNORMAL LOW (ref 135–145)
Sodium: 132 mEq/L — ABNORMAL LOW (ref 135–145)
Sodium: 132 mEq/L — ABNORMAL LOW (ref 135–145)
Sodium: 132 mEq/L — ABNORMAL LOW (ref 135–145)
Sodium: 134 mEq/L — ABNORMAL LOW (ref 135–145)
Sodium: 137 mEq/L (ref 135–145)

## 2011-01-20 LAB — CARDIAC PANEL(CRET KIN+CKTOT+MB+TROPI)
CK, MB: 1 ng/mL (ref 0.3–4.0)
CK, MB: 1.3 ng/mL (ref 0.3–4.0)
Relative Index: INVALID (ref 0.0–2.5)
Relative Index: INVALID (ref 0.0–2.5)
Total CK: 20 U/L (ref 7–177)
Total CK: 25 U/L (ref 7–177)
Total CK: 29 U/L (ref 7–177)
Troponin I: 0.02 ng/mL (ref 0.00–0.06)

## 2011-01-20 LAB — POCT I-STAT 7, (LYTES, BLD GAS, ICA,H+H)
Acid-base deficit: 11 mmol/L — ABNORMAL HIGH (ref 0.0–2.0)
Bicarbonate: 16.8 mEq/L — ABNORMAL LOW (ref 20.0–24.0)
Calcium, Ion: 0.89 mmol/L — ABNORMAL LOW (ref 1.12–1.32)
HCT: 21 % — ABNORMAL LOW (ref 36.0–46.0)
Hemoglobin: 10.9 g/dL — ABNORMAL LOW (ref 12.0–15.0)
Hemoglobin: 7.1 g/dL — ABNORMAL LOW (ref 12.0–15.0)
Patient temperature: 37.1
Potassium: 5.5 mEq/L — ABNORMAL HIGH (ref 3.5–5.1)
TCO2: 18 mmol/L (ref 0–100)
pCO2 arterial: 46.4 mmHg — ABNORMAL HIGH (ref 35.0–45.0)
pH, Arterial: 7.192 — CL (ref 7.350–7.400)
pH, Arterial: 7.259 — ABNORMAL LOW (ref 7.350–7.400)
pO2, Arterial: 574 mmHg — ABNORMAL HIGH (ref 80.0–100.0)

## 2011-01-20 LAB — MAGNESIUM
Magnesium: 1.3 mg/dL — ABNORMAL LOW (ref 1.5–2.5)
Magnesium: 1.4 mg/dL — ABNORMAL LOW (ref 1.5–2.5)
Magnesium: 1.6 mg/dL (ref 1.5–2.5)
Magnesium: 1.7 mg/dL (ref 1.5–2.5)
Magnesium: 1.8 mg/dL (ref 1.5–2.5)
Magnesium: 1.8 mg/dL (ref 1.5–2.5)
Magnesium: 1.8 mg/dL (ref 1.5–2.5)
Magnesium: 1.9 mg/dL (ref 1.5–2.5)
Magnesium: 2.1 mg/dL (ref 1.5–2.5)

## 2011-01-20 LAB — GLUCOSE, CAPILLARY
Glucose-Capillary: 106 mg/dL — ABNORMAL HIGH (ref 70–99)
Glucose-Capillary: 109 mg/dL — ABNORMAL HIGH (ref 70–99)
Glucose-Capillary: 112 mg/dL — ABNORMAL HIGH (ref 70–99)
Glucose-Capillary: 116 mg/dL — ABNORMAL HIGH (ref 70–99)
Glucose-Capillary: 117 mg/dL — ABNORMAL HIGH (ref 70–99)
Glucose-Capillary: 120 mg/dL — ABNORMAL HIGH (ref 70–99)
Glucose-Capillary: 120 mg/dL — ABNORMAL HIGH (ref 70–99)
Glucose-Capillary: 121 mg/dL — ABNORMAL HIGH (ref 70–99)
Glucose-Capillary: 121 mg/dL — ABNORMAL HIGH (ref 70–99)
Glucose-Capillary: 124 mg/dL — ABNORMAL HIGH (ref 70–99)
Glucose-Capillary: 124 mg/dL — ABNORMAL HIGH (ref 70–99)
Glucose-Capillary: 125 mg/dL — ABNORMAL HIGH (ref 70–99)
Glucose-Capillary: 127 mg/dL — ABNORMAL HIGH (ref 70–99)
Glucose-Capillary: 128 mg/dL — ABNORMAL HIGH (ref 70–99)
Glucose-Capillary: 128 mg/dL — ABNORMAL HIGH (ref 70–99)
Glucose-Capillary: 132 mg/dL — ABNORMAL HIGH (ref 70–99)
Glucose-Capillary: 133 mg/dL — ABNORMAL HIGH (ref 70–99)
Glucose-Capillary: 135 mg/dL — ABNORMAL HIGH (ref 70–99)
Glucose-Capillary: 135 mg/dL — ABNORMAL HIGH (ref 70–99)
Glucose-Capillary: 135 mg/dL — ABNORMAL HIGH (ref 70–99)
Glucose-Capillary: 135 mg/dL — ABNORMAL HIGH (ref 70–99)
Glucose-Capillary: 135 mg/dL — ABNORMAL HIGH (ref 70–99)
Glucose-Capillary: 135 mg/dL — ABNORMAL HIGH (ref 70–99)
Glucose-Capillary: 136 mg/dL — ABNORMAL HIGH (ref 70–99)
Glucose-Capillary: 139 mg/dL — ABNORMAL HIGH (ref 70–99)
Glucose-Capillary: 140 mg/dL — ABNORMAL HIGH (ref 70–99)
Glucose-Capillary: 141 mg/dL — ABNORMAL HIGH (ref 70–99)
Glucose-Capillary: 145 mg/dL — ABNORMAL HIGH (ref 70–99)
Glucose-Capillary: 146 mg/dL — ABNORMAL HIGH (ref 70–99)
Glucose-Capillary: 147 mg/dL — ABNORMAL HIGH (ref 70–99)
Glucose-Capillary: 149 mg/dL — ABNORMAL HIGH (ref 70–99)
Glucose-Capillary: 149 mg/dL — ABNORMAL HIGH (ref 70–99)
Glucose-Capillary: 149 mg/dL — ABNORMAL HIGH (ref 70–99)
Glucose-Capillary: 151 mg/dL — ABNORMAL HIGH (ref 70–99)
Glucose-Capillary: 156 mg/dL — ABNORMAL HIGH (ref 70–99)
Glucose-Capillary: 166 mg/dL — ABNORMAL HIGH (ref 70–99)
Glucose-Capillary: 169 mg/dL — ABNORMAL HIGH (ref 70–99)
Glucose-Capillary: 151 mg/dL — ABNORMAL HIGH (ref 70–99)
Glucose-Capillary: 122 mg/dL — ABNORMAL HIGH (ref 70–99)
Glucose-Capillary: 123 mg/dL — ABNORMAL HIGH (ref 70–99)
Glucose-Capillary: 124 mg/dL — ABNORMAL HIGH (ref 70–99)
Glucose-Capillary: 125 mg/dL — ABNORMAL HIGH (ref 70–99)
Glucose-Capillary: 130 mg/dL — ABNORMAL HIGH (ref 70–99)
Glucose-Capillary: 130 mg/dL — ABNORMAL HIGH (ref 70–99)
Glucose-Capillary: 133 mg/dL — ABNORMAL HIGH (ref 70–99)
Glucose-Capillary: 135 mg/dL — ABNORMAL HIGH (ref 70–99)
Glucose-Capillary: 135 mg/dL — ABNORMAL HIGH (ref 70–99)
Glucose-Capillary: 138 mg/dL — ABNORMAL HIGH (ref 70–99)
Glucose-Capillary: 139 mg/dL — ABNORMAL HIGH (ref 70–99)
Glucose-Capillary: 142 mg/dL — ABNORMAL HIGH (ref 70–99)
Glucose-Capillary: 146 mg/dL — ABNORMAL HIGH (ref 70–99)
Glucose-Capillary: 147 mg/dL — ABNORMAL HIGH (ref 70–99)
Glucose-Capillary: 150 mg/dL — ABNORMAL HIGH (ref 70–99)
Glucose-Capillary: 151 mg/dL — ABNORMAL HIGH (ref 70–99)
Glucose-Capillary: 151 mg/dL — ABNORMAL HIGH (ref 70–99)
Glucose-Capillary: 152 mg/dL — ABNORMAL HIGH (ref 70–99)
Glucose-Capillary: 153 mg/dL — ABNORMAL HIGH (ref 70–99)
Glucose-Capillary: 159 mg/dL — ABNORMAL HIGH (ref 70–99)
Glucose-Capillary: 160 mg/dL — ABNORMAL HIGH (ref 70–99)
Glucose-Capillary: 162 mg/dL — ABNORMAL HIGH (ref 70–99)
Glucose-Capillary: 162 mg/dL — ABNORMAL HIGH (ref 70–99)
Glucose-Capillary: 169 mg/dL — ABNORMAL HIGH (ref 70–99)
Glucose-Capillary: 201 mg/dL — ABNORMAL HIGH (ref 70–99)

## 2011-01-20 LAB — CULTURE, BLOOD (ROUTINE X 2)
Culture  Setup Time: 201111061133
Culture  Setup Time: 201111090212
Culture  Setup Time: 201111131721
Culture  Setup Time: 201111280009
Culture: NO GROWTH
Culture: NO GROWTH
Culture: NO GROWTH
Culture: NO GROWTH
Culture: NO GROWTH

## 2011-01-20 LAB — POCT I-STAT 3, ART BLOOD GAS (G3+)
Acid-base deficit: 3 mmol/L — ABNORMAL HIGH (ref 0.0–2.0)
Acid-base deficit: 6 mmol/L — ABNORMAL HIGH (ref 0.0–2.0)
Bicarbonate: 19.6 mEq/L — ABNORMAL LOW (ref 20.0–24.0)
Bicarbonate: 20.4 mEq/L (ref 20.0–24.0)
Bicarbonate: 23.6 mEq/L (ref 20.0–24.0)
O2 Saturation: 100 %
O2 Saturation: 96 %
O2 Saturation: 96 %
O2 Saturation: 98 %
Patient temperature: 97.4
Patient temperature: 97.9
Patient temperature: 98.8
Patient temperature: 99
TCO2: 16 mmol/L (ref 0–100)
TCO2: 21 mmol/L (ref 0–100)
TCO2: 23 mmol/L (ref 0–100)
TCO2: 25 mmol/L (ref 0–100)
pCO2 arterial: 27.4 mmHg — ABNORMAL LOW (ref 35.0–45.0)
pCO2 arterial: 38.9 mmHg (ref 35.0–45.0)
pCO2 arterial: 46.1 mmHg — ABNORMAL HIGH (ref 35.0–45.0)
pH, Arterial: 7.315 — ABNORMAL LOW (ref 7.350–7.400)
pH, Arterial: 7.324 — ABNORMAL LOW (ref 7.350–7.400)
pH, Arterial: 7.377 (ref 7.350–7.400)

## 2011-01-20 LAB — URINALYSIS, ROUTINE W REFLEX MICROSCOPIC
Bilirubin Urine: NEGATIVE
Bilirubin Urine: NEGATIVE
Ketones, ur: NEGATIVE mg/dL
Ketones, ur: NEGATIVE mg/dL
Leukocytes, UA: NEGATIVE
Nitrite: NEGATIVE
Nitrite: NEGATIVE
Specific Gravity, Urine: 1.009 (ref 1.005–1.030)
Specific Gravity, Urine: 1.014 (ref 1.005–1.030)
Specific Gravity, Urine: 1.017 (ref 1.005–1.030)
Urobilinogen, UA: 0.2 mg/dL (ref 0.0–1.0)
pH: 5 (ref 5.0–8.0)
pH: 6 (ref 5.0–8.0)
pH: 7 (ref 5.0–8.0)

## 2011-01-20 LAB — DIFFERENTIAL
Basophils Absolute: 0.1 10*3/uL (ref 0.0–0.1)
Basophils Absolute: 0.1 10*3/uL (ref 0.0–0.1)
Basophils Absolute: 0.1 10*3/uL (ref 0.0–0.1)
Basophils Absolute: 0 10*3/uL (ref 0.0–0.1)
Basophils Absolute: 0 10*3/uL (ref 0.0–0.1)
Basophils Absolute: 0.1 10*3/uL (ref 0.0–0.1)
Basophils Relative: 0 % (ref 0–1)
Basophils Relative: 0 % (ref 0–1)
Basophils Relative: 1 % (ref 0–1)
Basophils Relative: 1 % (ref 0–1)
Eosinophils Absolute: 0.4 10*3/uL (ref 0.0–0.7)
Eosinophils Absolute: 0 10*3/uL (ref 0.0–0.7)
Eosinophils Absolute: 0.2 10*3/uL (ref 0.0–0.7)
Eosinophils Absolute: 0.4 10*3/uL (ref 0.0–0.7)
Eosinophils Absolute: 0.5 10*3/uL (ref 0.0–0.7)
Eosinophils Absolute: 0.9 10*3/uL — ABNORMAL HIGH (ref 0.0–0.7)
Eosinophils Relative: 4 % (ref 0–5)
Eosinophils Relative: 0 % (ref 0–5)
Eosinophils Relative: 2 % (ref 0–5)
Eosinophils Relative: 3 % (ref 0–5)
Eosinophils Relative: 3 % (ref 0–5)
Eosinophils Relative: 4 % (ref 0–5)
Eosinophils Relative: 4 % (ref 0–5)
Lymphocytes Relative: 17 % (ref 12–46)
Lymphocytes Relative: 22 % (ref 12–46)
Lymphocytes Relative: 10 % — ABNORMAL LOW (ref 12–46)
Lymphocytes Relative: 12 % (ref 12–46)
Lymphocytes Relative: 5 % — ABNORMAL LOW (ref 12–46)
Lymphocytes Relative: 6 % — ABNORMAL LOW (ref 12–46)
Lymphocytes Relative: 8 % — ABNORMAL LOW (ref 12–46)
Lymphocytes Relative: 8 % — ABNORMAL LOW (ref 12–46)
Lymphs Abs: 1.1 10*3/uL (ref 0.7–4.0)
Lymphs Abs: 1.7 10*3/uL (ref 0.7–4.0)
Lymphs Abs: 0.8 10*3/uL (ref 0.7–4.0)
Lymphs Abs: 0.9 10*3/uL (ref 0.7–4.0)
Lymphs Abs: 0.9 10*3/uL (ref 0.7–4.0)
Lymphs Abs: 1 10*3/uL (ref 0.7–4.0)
Lymphs Abs: 1 10*3/uL (ref 0.7–4.0)
Lymphs Abs: 1.3 10*3/uL (ref 0.7–4.0)
Monocytes Absolute: 1 10*3/uL (ref 0.1–1.0)
Monocytes Absolute: 1.1 10*3/uL — ABNORMAL HIGH (ref 0.1–1.0)
Monocytes Absolute: 0.4 10*3/uL (ref 0.1–1.0)
Monocytes Absolute: 0.8 10*3/uL (ref 0.1–1.0)
Monocytes Absolute: 1 10*3/uL (ref 0.1–1.0)
Monocytes Relative: 10 % (ref 3–12)
Monocytes Relative: 9 % (ref 3–12)
Neutro Abs: 4.5 10*3/uL (ref 1.7–7.7)
Neutro Abs: 6.2 10*3/uL (ref 1.7–7.7)
Neutro Abs: 12.4 10*3/uL — ABNORMAL HIGH (ref 1.7–7.7)
Neutro Abs: 13.6 10*3/uL — ABNORMAL HIGH (ref 1.7–7.7)
Neutro Abs: 5.8 10*3/uL (ref 1.7–7.7)
Neutro Abs: 8.7 10*3/uL — ABNORMAL HIGH (ref 1.7–7.7)
Neutrophils Relative %: 59 % (ref 43–77)
Neutrophils Relative %: 76 % (ref 43–77)
Neutrophils Relative %: 84 % — ABNORMAL HIGH (ref 43–77)

## 2011-01-20 LAB — CROSSMATCH
Antibody Screen: NEGATIVE
Unit division: 0
Unit division: 0
Unit division: 0
Unit division: 0
Unit division: 0
Unit division: 0
Unit division: 0
Unit division: 0
Unit division: 0

## 2011-01-20 LAB — CHOLESTEROL, TOTAL
Cholesterol: 101 mg/dL (ref 0–200)
Cholesterol: 69 mg/dL (ref 0–200)
Cholesterol: 107 mg/dL (ref 0–200)
Cholesterol: 87 mg/dL (ref 0–200)

## 2011-01-20 LAB — BRAIN NATRIURETIC PEPTIDE: Pro B Natriuretic peptide (BNP): 179 pg/mL — ABNORMAL HIGH (ref 0.0–100.0)

## 2011-01-20 LAB — PREPARE FRESH FROZEN PLASMA: Unit division: 0

## 2011-01-20 LAB — CORTISOL: Cortisol, Plasma: 14.3 ug/dL

## 2011-01-20 LAB — POCT CARDIAC MARKERS
CKMB, poc: <1 ng/mL — ABNORMAL LOW (ref 1.0–8.0)
Troponin i, poc: <0.05 ng/mL (ref 0.00–0.09)

## 2011-01-20 LAB — PREPARE PLATELETS

## 2011-01-20 LAB — PHOSPHORUS
Phosphorus: 3.9 mg/dL (ref 2.3–4.6)
Phosphorus: 2.7 mg/dL (ref 2.3–4.6)
Phosphorus: 3.4 mg/dL (ref 2.3–4.6)
Phosphorus: 3.5 mg/dL (ref 2.3–4.6)
Phosphorus: 4.4 mg/dL (ref 2.3–4.6)
Phosphorus: 5.7 mg/dL — ABNORMAL HIGH (ref 2.3–4.6)
Phosphorus: 5.8 mg/dL — ABNORMAL HIGH (ref 2.3–4.6)

## 2011-01-20 LAB — URINE MICROSCOPIC-ADD ON

## 2011-01-20 LAB — HEMOCCULT GUIAC POC 1CARD (OFFICE)
Fecal Occult Bld: POSITIVE
Fecal Occult Bld: POSITIVE
Fecal Occult Bld: POSITIVE

## 2011-01-20 LAB — MRSA PCR SCREENING
MRSA by PCR: NEGATIVE
MRSA by PCR: NEGATIVE

## 2011-01-20 LAB — CK TOTAL AND CKMB (NOT AT ARMC)
CK, MB: 1.3 ng/mL (ref 0.3–4.0)
Relative Index: INVALID (ref 0.0–2.5)

## 2011-01-20 LAB — TROPONIN I
Troponin I: 0.02 ng/mL (ref 0.00–0.06)
Troponin I: 0.02 ng/mL (ref 0.00–0.06)
Troponin I: 0.02 ng/mL (ref 0.00–0.06)

## 2011-01-20 LAB — URINE CULTURE
Colony Count: NO GROWTH
Culture  Setup Time: 201111131729
Culture: NO GROWTH

## 2011-01-20 LAB — FERRITIN: Ferritin: 325 ng/mL — ABNORMAL HIGH (ref 10–291)

## 2011-01-20 LAB — LIPASE, BLOOD
Lipase: 109 U/L — ABNORMAL HIGH (ref 11–59)
Lipase: 47 U/L (ref 11–59)
Lipase: 88 U/L — ABNORMAL HIGH (ref 11–59)

## 2011-01-20 LAB — TRIGLYCERIDES
Triglycerides: 131 mg/dL (ref <150)
Triglycerides: 162 mg/dL — ABNORMAL HIGH (ref <150)
Triglycerides: 206 mg/dL — ABNORMAL HIGH (ref <150)

## 2011-01-20 LAB — RETICULOCYTES
RBC.: 2.56 MIL/uL — ABNORMAL LOW (ref 3.87–5.11)
Retic Ct Pct: 1.6 % (ref 0.4–3.1)

## 2011-01-20 LAB — HEPATITIS PANEL, ACUTE
HCV Ab: NEGATIVE
Hep B C IgM: NEGATIVE

## 2011-01-20 LAB — CREATININE, URINE, RANDOM: Creatinine, Urine: 11.6 mg/dL

## 2011-01-20 LAB — AMYLASE: Amylase: 75 U/L (ref 0–105)

## 2011-01-20 LAB — PREALBUMIN
Prealbumin: 7 mg/dL — ABNORMAL LOW (ref 18.0–45.0)
Prealbumin: 9.1 mg/dL — ABNORMAL LOW (ref 18.0–45.0)

## 2011-01-20 LAB — PROCALCITONIN: Procalcitonin: 0.58 ng/mL

## 2011-01-20 LAB — LACTIC ACID, PLASMA: Lactic Acid, Venous: 1.4 mmol/L (ref 0.5–2.2)

## 2011-01-21 ENCOUNTER — Emergency Department (HOSPITAL_COMMUNITY): Payer: Medicaid Other

## 2011-01-21 ENCOUNTER — Inpatient Hospital Stay (HOSPITAL_COMMUNITY)
Admission: EM | Admit: 2011-01-21 | Discharge: 2011-01-30 | DRG: 392 | Disposition: A | Discharge status: Home Health | Payer: Medicaid Other | Attending: Internal Medicine | Admitting: Internal Medicine

## 2011-01-21 DIAGNOSIS — E46 Unspecified protein-calorie malnutrition: Secondary | ICD-10-CM | POA: Diagnosis present

## 2011-01-21 DIAGNOSIS — R112 Nausea with vomiting, unspecified: Principal | ICD-10-CM | POA: Diagnosis present

## 2011-01-21 DIAGNOSIS — J45909 Unspecified asthma, uncomplicated: Secondary | ICD-10-CM | POA: Diagnosis present

## 2011-01-21 DIAGNOSIS — K449 Diaphragmatic hernia without obstruction or gangrene: Secondary | ICD-10-CM | POA: Diagnosis present

## 2011-01-21 DIAGNOSIS — R7401 Elevation of levels of liver transaminase levels: Secondary | ICD-10-CM | POA: Diagnosis present

## 2011-01-21 DIAGNOSIS — Z7982 Long term (current) use of aspirin: Secondary | ICD-10-CM

## 2011-01-21 DIAGNOSIS — R7402 Elevation of levels of lactic acid dehydrogenase (LDH): Secondary | ICD-10-CM | POA: Diagnosis present

## 2011-01-21 DIAGNOSIS — I252 Old myocardial infarction: Secondary | ICD-10-CM

## 2011-01-21 DIAGNOSIS — I10 Essential (primary) hypertension: Secondary | ICD-10-CM | POA: Diagnosis present

## 2011-01-21 DIAGNOSIS — Z932 Ileostomy status: Secondary | ICD-10-CM

## 2011-01-21 DIAGNOSIS — I4891 Unspecified atrial fibrillation: Secondary | ICD-10-CM | POA: Diagnosis present

## 2011-01-21 DIAGNOSIS — K029 Dental caries, unspecified: Secondary | ICD-10-CM | POA: Diagnosis present

## 2011-01-21 DIAGNOSIS — D638 Anemia in other chronic diseases classified elsewhere: Secondary | ICD-10-CM | POA: Diagnosis present

## 2011-01-21 DIAGNOSIS — N179 Acute kidney failure, unspecified: Secondary | ICD-10-CM | POA: Diagnosis present

## 2011-01-21 DIAGNOSIS — R1310 Dysphagia, unspecified: Secondary | ICD-10-CM | POA: Diagnosis present

## 2011-01-21 DIAGNOSIS — I2510 Atherosclerotic heart disease of native coronary artery without angina pectoris: Secondary | ICD-10-CM | POA: Diagnosis present

## 2011-01-21 LAB — CBC
MCH: 28.8 pg (ref 26.0–34.0)
MCV: 85.8 fL (ref 78.0–100.0)
Platelets: 227 10*3/uL (ref 150–400)
RBC: 3.72 MIL/uL — ABNORMAL LOW (ref 3.87–5.11)
RDW: 13.6 % (ref 11.5–15.5)

## 2011-01-21 LAB — COMPREHENSIVE METABOLIC PANEL
Albumin: 3 g/dL — ABNORMAL LOW (ref 3.5–5.2)
BUN: 38 mg/dL — ABNORMAL HIGH (ref 6–23)
Chloride: 109 mEq/L (ref 96–112)
Creatinine, Ser: 1 mg/dL (ref 0.4–1.2)
GFR calc non Af Amer: 55 mL/min — ABNORMAL LOW (ref >60)
Glucose, Bld: 113 mg/dL — ABNORMAL HIGH (ref 70–99)
Total Bilirubin: 1.6 mg/dL — ABNORMAL HIGH (ref 0.3–1.2)

## 2011-01-21 LAB — URINALYSIS, ROUTINE W REFLEX MICROSCOPIC
Glucose, UA: NEGATIVE mg/dL
Nitrite: NEGATIVE
Protein, ur: NEGATIVE mg/dL
Urobilinogen, UA: 0.2 mg/dL (ref 0.0–1.0)

## 2011-01-21 LAB — DIFFERENTIAL
Eosinophils Absolute: 0.2 10*3/uL (ref 0.0–0.7)
Eosinophils Relative: 3 % (ref 0–5)
Lymphs Abs: 1.7 10*3/uL (ref 0.7–4.0)
Monocytes Relative: 6 % (ref 3–12)

## 2011-01-21 LAB — LIPASE, BLOOD: Lipase: 50 U/L (ref 11–59)

## 2011-01-21 LAB — POCT CARDIAC MARKERS

## 2011-01-21 MED ORDER — IOHEXOL 300 MG/ML  SOLN
95.0000 mL | Freq: Once | INTRAMUSCULAR | Status: AC | PRN
  Administered 2011-01-21: 95 mL via INTRAVENOUS

## 2011-01-22 LAB — COMPREHENSIVE METABOLIC PANEL
Albumin: 3.5 g/dL (ref 3.5–5.2)
Alkaline Phosphatase: 101 U/L (ref 39–117)
BUN: 16 mg/dL (ref 6–23)
CO2: 22 mEq/L (ref 19–32)
Calcium: 10 mg/dL (ref 8.4–10.5)
Chloride: 104 mEq/L (ref 96–112)
Chloride: 114 mEq/L — ABNORMAL HIGH (ref 96–112)
Creatinine, Ser: 1.28 mg/dL — ABNORMAL HIGH (ref 0.4–1.2)
Creatinine, Ser: 1.08 mg/dL (ref 0.4–1.2)
GFR calc non Af Amer: 41 mL/min — ABNORMAL LOW (ref >60)
GFR calc non Af Amer: 51 mL/min — ABNORMAL LOW (ref >60)
Glucose, Bld: 169 mg/dL — ABNORMAL HIGH (ref 70–99)
Glucose, Bld: 157 mg/dL — ABNORMAL HIGH (ref 70–99)
Potassium: 4.5 mEq/L (ref 3.5–5.1)
Total Bilirubin: 1.6 mg/dL — ABNORMAL HIGH (ref 0.3–1.2)
Total Bilirubin: 0.6 mg/dL (ref 0.3–1.2)

## 2011-01-22 LAB — CBC
HCT: 33 % — ABNORMAL LOW (ref 36.0–46.0)
HCT: 31.2 % — ABNORMAL LOW (ref 36.0–46.0)
HCT: 31.9 % — ABNORMAL LOW (ref 36.0–46.0)
HCT: 32.7 % — ABNORMAL LOW (ref 36.0–46.0)
HCT: 33.4 % — ABNORMAL LOW (ref 36.0–46.0)
HCT: 34.7 % — ABNORMAL LOW (ref 36.0–46.0)
Hemoglobin: 11.4 g/dL — ABNORMAL LOW (ref 12.0–15.0)
Hemoglobin: 10.3 g/dL — ABNORMAL LOW (ref 12.0–15.0)
Hemoglobin: 10.4 g/dL — ABNORMAL LOW (ref 12.0–15.0)
Hemoglobin: 10.6 g/dL — ABNORMAL LOW (ref 12.0–15.0)
Hemoglobin: 10.7 g/dL — ABNORMAL LOW (ref 12.0–15.0)
MCH: 29.3 pg (ref 26.0–34.0)
MCH: 27.4 pg (ref 26.0–34.0)
MCH: 27.7 pg (ref 26.0–34.0)
MCH: 27.7 pg (ref 26.0–34.0)
MCH: 27.8 pg (ref 26.0–34.0)
MCH: 28 pg (ref 26.0–34.0)
MCHC: 34.5 g/dL (ref 30.0–36.0)
MCHC: 32 g/dL (ref 30.0–36.0)
MCHC: 32.3 g/dL (ref 30.0–36.0)
MCHC: 32.4 g/dL (ref 30.0–36.0)
MCV: 84.8 fL (ref 78.0–100.0)
MCV: 85.6 fL (ref 78.0–100.0)
MCV: 85.8 fL (ref 78.0–100.0)
MCV: 86 fL (ref 78.0–100.0)
MCV: 86.1 fL (ref 78.0–100.0)
MCV: 86.3 fL (ref 78.0–100.0)
MCV: 86.7 fL (ref 78.0–100.0)
Platelets: 233 10*3/uL (ref 150–400)
Platelets: 243 10*3/uL (ref 150–400)
RBC: 3.89 MIL/uL (ref 3.87–5.11)
RBC: 3.63 MIL/uL — ABNORMAL LOW (ref 3.87–5.11)
RBC: 3.75 MIL/uL — ABNORMAL LOW (ref 3.87–5.11)
RBC: 3.9 MIL/uL (ref 3.87–5.11)
RBC: 4.03 MIL/uL (ref 3.87–5.11)
RDW: 12.8 % (ref 11.5–15.5)
RDW: 12.9 % (ref 11.5–15.5)
RDW: 13 % (ref 11.5–15.5)
WBC: 7.6 10*3/uL (ref 4.0–10.5)
WBC: 8.1 10*3/uL (ref 4.0–10.5)
WBC: 8.3 10*3/uL (ref 4.0–10.5)
WBC: 9 10*3/uL (ref 4.0–10.5)

## 2011-01-22 LAB — DIFFERENTIAL
Basophils Absolute: 0 10*3/uL (ref 0.0–0.1)
Basophils Relative: 0 % (ref 0–1)
Lymphocytes Relative: 11 % — ABNORMAL LOW (ref 12–46)
Neutro Abs: 7.2 10*3/uL (ref 1.7–7.7)
Neutrophils Relative %: 86 % — ABNORMAL HIGH (ref 43–77)

## 2011-01-22 LAB — APTT: aPTT: 33 seconds (ref 24–37)

## 2011-01-22 LAB — PROTIME-INR
INR: 1.06 (ref 0.00–1.49)
Prothrombin Time: 14 seconds (ref 11.6–15.2)

## 2011-01-22 LAB — CARDIAC PANEL(CRET KIN+CKTOT+MB+TROPI)
Relative Index: INVALID (ref 0.0–2.5)
Relative Index: INVALID (ref 0.0–2.5)
Troponin I: 0.04 ng/mL (ref 0.00–0.06)

## 2011-01-22 LAB — TSH: TSH: 0.686 u[IU]/mL (ref 0.350–4.500)

## 2011-01-22 LAB — HEPARIN LEVEL (UNFRACTIONATED): Heparin Unfractionated: 0.7 IU/mL (ref 0.30–0.70)

## 2011-01-22 LAB — GLUCOSE, CAPILLARY: Glucose-Capillary: 117 mg/dL — ABNORMAL HIGH (ref 70–99)

## 2011-01-23 LAB — POCT I-STAT, CHEM 8
BUN: 20 mg/dL (ref 6–23)
Calcium, Ion: 1.21 mmol/L (ref 1.12–1.32)
Chloride: 105 mEq/L (ref 96–112)

## 2011-01-23 LAB — CBC
HCT: 33.5 % — ABNORMAL LOW (ref 36.0–46.0)
HCT: 34.7 % — ABNORMAL LOW (ref 36.0–46.0)
HCT: 35.1 % — ABNORMAL LOW (ref 36.0–46.0)
HCT: 35.9 % — ABNORMAL LOW (ref 36.0–46.0)
Hemoglobin: 11.4 g/dL — ABNORMAL LOW (ref 12.0–15.0)
Hemoglobin: 11.5 g/dL — ABNORMAL LOW (ref 12.0–15.0)
Hemoglobin: 11.9 g/dL — ABNORMAL LOW (ref 12.0–15.0)
Hemoglobin: 11.5 g/dL — ABNORMAL LOW (ref 12.0–15.0)
MCH: 27.6 pg (ref 26.0–34.0)
MCH: 28.1 pg (ref 26.0–34.0)
MCH: 28.3 pg (ref 26.0–34.0)
MCHC: 32.5 g/dL (ref 30.0–36.0)
MCHC: 32.9 g/dL (ref 30.0–36.0)
MCHC: 33.1 g/dL (ref 30.0–36.0)
MCHC: 35 g/dL (ref 30.0–36.0)
MCV: 83.3 fL (ref 78.0–100.0)
MCV: 85 fL (ref 78.0–100.0)
MCV: 85.3 fL (ref 78.0–100.0)
MCV: 85.8 fL (ref 78.0–100.0)
Platelets: 208 10*3/uL (ref 150–400)
Platelets: 227 10*3/uL (ref 150–400)
Platelets: 239 10*3/uL (ref 150–400)
Platelets: 252 10*3/uL (ref 150–400)
Platelets: 253 10*3/uL (ref 150–400)
RBC: 3.86 MIL/uL — ABNORMAL LOW (ref 3.87–5.11)
RBC: 4.07 MIL/uL (ref 3.87–5.11)
RBC: 4.21 MIL/uL (ref 3.87–5.11)
RDW: 13.9 % (ref 11.5–15.5)
RDW: 14 % (ref 11.5–15.5)
RDW: 14.1 % (ref 11.5–15.5)
RDW: 14.1 % (ref 11.5–15.5)
RDW: 14.2 % (ref 11.5–15.5)
WBC: 13.7 10*3/uL — ABNORMAL HIGH (ref 4.0–10.5)
WBC: 17.1 10*3/uL — ABNORMAL HIGH (ref 4.0–10.5)
WBC: 17.2 10*3/uL — ABNORMAL HIGH (ref 4.0–10.5)
WBC: 25.7 10*3/uL — ABNORMAL HIGH (ref 4.0–10.5)
WBC: 9.2 10*3/uL (ref 4.0–10.5)

## 2011-01-23 LAB — COMPREHENSIVE METABOLIC PANEL
Albumin: 2.8 g/dL — ABNORMAL LOW (ref 3.5–5.2)
Alkaline Phosphatase: 72 U/L (ref 39–117)
Alkaline Phosphatase: 266 U/L — ABNORMAL HIGH (ref 39–117)
BUN: 34 mg/dL — ABNORMAL HIGH (ref 6–23)
BUN: 38 mg/dL — ABNORMAL HIGH (ref 6–23)
Chloride: 104 mEq/L (ref 96–112)
Creatinine, Ser: 1.38 mg/dL — ABNORMAL HIGH (ref 0.4–1.2)
GFR calc non Af Amer: 38 mL/min — ABNORMAL LOW (ref >60)
Glucose, Bld: 133 mg/dL — ABNORMAL HIGH (ref 70–99)
Glucose, Bld: 123 mg/dL — ABNORMAL HIGH (ref 70–99)
Potassium: 4 mEq/L (ref 3.5–5.1)
Potassium: 3.7 mEq/L (ref 3.5–5.1)
Total Bilirubin: 1.1 mg/dL (ref 0.3–1.2)
Total Protein: 5.6 g/dL — ABNORMAL LOW (ref 6.0–8.3)
Total Protein: 7.6 g/dL (ref 6.0–8.3)

## 2011-01-23 LAB — BASIC METABOLIC PANEL
BUN: 18 mg/dL (ref 6–23)
BUN: 28 mg/dL — ABNORMAL HIGH (ref 6–23)
BUN: 31 mg/dL — ABNORMAL HIGH (ref 6–23)
BUN: 32 mg/dL — ABNORMAL HIGH (ref 6–23)
BUN: 36 mg/dL — ABNORMAL HIGH (ref 6–23)
BUN: 42 mg/dL — ABNORMAL HIGH (ref 6–23)
CO2: 16 mEq/L — ABNORMAL LOW (ref 19–32)
CO2: 24 mEq/L (ref 19–32)
Calcium: 7.9 mg/dL — ABNORMAL LOW (ref 8.4–10.5)
Calcium: 8.3 mg/dL — ABNORMAL LOW (ref 8.4–10.5)
Calcium: 9.1 mg/dL (ref 8.4–10.5)
Chloride: 111 mEq/L (ref 96–112)
Chloride: 114 mEq/L — ABNORMAL HIGH (ref 96–112)
Chloride: 115 mEq/L — ABNORMAL HIGH (ref 96–112)
Creatinine, Ser: 1.24 mg/dL — ABNORMAL HIGH (ref 0.4–1.2)
Creatinine, Ser: 1.28 mg/dL — ABNORMAL HIGH (ref 0.4–1.2)
Creatinine, Ser: 1.34 mg/dL — ABNORMAL HIGH (ref 0.4–1.2)
Creatinine, Ser: 1.36 mg/dL — ABNORMAL HIGH (ref 0.4–1.2)
GFR calc Af Amer: 50 mL/min — ABNORMAL LOW (ref >60)
GFR calc Af Amer: 51 mL/min — ABNORMAL LOW (ref >60)
GFR calc Af Amer: 59 mL/min — ABNORMAL LOW (ref >60)
GFR calc non Af Amer: 39 mL/min — ABNORMAL LOW (ref >60)
GFR calc non Af Amer: 39 mL/min — ABNORMAL LOW (ref >60)
GFR calc non Af Amer: 42 mL/min — ABNORMAL LOW (ref >60)
GFR calc non Af Amer: 42 mL/min — ABNORMAL LOW (ref >60)
GFR calc non Af Amer: 43 mL/min — ABNORMAL LOW (ref >60)
Glucose, Bld: 151 mg/dL — ABNORMAL HIGH (ref 70–99)
Glucose, Bld: 152 mg/dL — ABNORMAL HIGH (ref 70–99)
Glucose, Bld: 176 mg/dL — ABNORMAL HIGH (ref 70–99)
Potassium: 3.8 mEq/L (ref 3.5–5.1)
Potassium: 4.9 mEq/L (ref 3.5–5.1)
Potassium: 5.2 mEq/L — ABNORMAL HIGH (ref 3.5–5.1)
Sodium: 140 mEq/L (ref 135–145)
Sodium: 142 mEq/L (ref 135–145)
Sodium: 143 mEq/L (ref 135–145)
Sodium: 143 mEq/L (ref 135–145)

## 2011-01-23 LAB — CARDIAC PANEL(CRET KIN+CKTOT+MB+TROPI)
CK, MB: 15.1 ng/mL (ref 0.3–4.0)
CK, MB: 19 ng/mL (ref 0.3–4.0)
Relative Index: 19 — ABNORMAL HIGH (ref 0.0–2.5)
Total CK: 100 U/L (ref 7–177)
Total CK: 73 U/L (ref 7–177)
Troponin I: 0.44 ng/mL — ABNORMAL HIGH (ref 0.00–0.06)
Troponin I: 0.66 ng/mL (ref 0.00–0.06)
Troponin I: 0.7 ng/mL (ref 0.00–0.06)

## 2011-01-23 LAB — GLUCOSE, CAPILLARY
Glucose-Capillary: 113 mg/dL — ABNORMAL HIGH (ref 70–99)
Glucose-Capillary: 117 mg/dL — ABNORMAL HIGH (ref 70–99)
Glucose-Capillary: 117 mg/dL — ABNORMAL HIGH (ref 70–99)
Glucose-Capillary: 118 mg/dL — ABNORMAL HIGH (ref 70–99)
Glucose-Capillary: 125 mg/dL — ABNORMAL HIGH (ref 70–99)
Glucose-Capillary: 126 mg/dL — ABNORMAL HIGH (ref 70–99)
Glucose-Capillary: 137 mg/dL — ABNORMAL HIGH (ref 70–99)
Glucose-Capillary: 145 mg/dL — ABNORMAL HIGH (ref 70–99)
Glucose-Capillary: 151 mg/dL — ABNORMAL HIGH (ref 70–99)
Glucose-Capillary: 151 mg/dL — ABNORMAL HIGH (ref 70–99)
Glucose-Capillary: 153 mg/dL — ABNORMAL HIGH (ref 70–99)
Glucose-Capillary: 153 mg/dL — ABNORMAL HIGH (ref 70–99)
Glucose-Capillary: 164 mg/dL — ABNORMAL HIGH (ref 70–99)
Glucose-Capillary: 168 mg/dL — ABNORMAL HIGH (ref 70–99)
Glucose-Capillary: 170 mg/dL — ABNORMAL HIGH (ref 70–99)
Glucose-Capillary: 175 mg/dL — ABNORMAL HIGH (ref 70–99)
Glucose-Capillary: 202 mg/dL — ABNORMAL HIGH (ref 70–99)
Glucose-Capillary: 202 mg/dL — ABNORMAL HIGH (ref 70–99)

## 2011-01-23 LAB — LIPID PANEL
LDL Cholesterol: 113 mg/dL — ABNORMAL HIGH (ref 0–99)
Total CHOL/HDL Ratio: 4 RATIO
VLDL: 17 mg/dL (ref 0–40)

## 2011-01-23 LAB — DIFFERENTIAL
Basophils Absolute: 0.1 10*3/uL (ref 0.0–0.1)
Basophils Relative: 1 % (ref 0–1)
Eosinophils Relative: 3 % (ref 0–5)
Lymphocytes Relative: 23 % (ref 12–46)
Lymphs Abs: 2.1 10*3/uL (ref 0.7–4.0)
Monocytes Absolute: 0.6 10*3/uL (ref 0.1–1.0)
Monocytes Relative: 5 % (ref 3–12)
Neutro Abs: 5.2 10*3/uL (ref 1.7–7.7)
Neutrophils Relative %: 65 % (ref 43–77)

## 2011-01-23 LAB — PROTIME-INR: INR: 1.07 (ref 0.00–1.49)

## 2011-01-23 LAB — D-DIMER, QUANTITATIVE: D-Dimer, Quant: 0.33 ug/mL-FEU (ref 0.00–0.48)

## 2011-01-23 LAB — TROPONIN I: Troponin I: 0.09 ng/mL — ABNORMAL HIGH (ref 0.00–0.06)

## 2011-01-23 LAB — BRAIN NATRIURETIC PEPTIDE: Pro B Natriuretic peptide (BNP): 33 pg/mL (ref 0.0–100.0)

## 2011-01-23 LAB — TSH: TSH: 0.585 u[IU]/mL (ref 0.350–4.500)

## 2011-01-23 LAB — HEPATITIS PANEL, ACUTE: Hep A IgM: NEGATIVE

## 2011-01-23 LAB — CHOLESTEROL, TOTAL: Cholesterol: 143 mg/dL (ref 0–200)

## 2011-01-23 LAB — TRIGLYCERIDES: Triglycerides: 144 mg/dL (ref <150)

## 2011-01-23 NOTE — H&P (Signed)
NAME:  [name], [name] NO.:  [id number]  MEDICAL RECORD NO.:  [id number]           PATIENT TYPE:  E  LOCATION:  [facility]                         FACILITY:  [facility]  PHYSICIAN:  Mariea Stable, MD   DATE OF BIRTH:  [birth year]  DATE OF ADMISSION:  01/21/2011 DATE OF DISCHARGE:                             HISTORY & PHYSICAL   PRIMARY CARE PHYSICIAN:  [facility], but the patient is currently at [facility] followed by Dr. Leanord Hawking.  PRIMARY CARDIOLOGIST:  Bevelyn Buckles. Bensimhon, MD  CHIEF COMPLAINT:  Nausea, vomiting with one episode of hematemesis.  HISTORY OF PRESENT ILLNESS:  [name] is a 70 year old [place] woman who does not speak English with a pretty complex past medical history including choledocholithiasis status post ERCP and sphincterotomy, duodenotomy, and repair of duodenal perforation with bleeding, open cholecystectomy with common bile duct exploration, placement of a P- tube, right colectomy with ileostomy as well as paroxysmal atrial fibrillation and coronary artery disease status post NSTEMI who presents with one episode of nausea, vomiting containing small amount of blood. The patient reports that approximately 8 in the morning today, she does developed some nausea with associated vomiting and noticed a small amount of blood present.  At that point, the skilled nursing facility transferred the patient to the emergency department.  She has had no further episodes of nausea or vomiting since then, but continues to have a burning sensation in the epigastric area that radiates into the chest periodically, although she has not had any further episodes of emesis. She reports she has not had symptoms like this before and was at her usual state of health up until the day prior to admission.  Of note, she has not had any bleeding episodes aside from the small amount of blood noted today.  Of note, the patient had a prolonged hospitalization from  November 2011 through February 2012 for the procedures mentioned above.  The patient currently has an ostomy bag in the right upper quadrant secondary to a duodenocutaneous fistula along with a T-tube as well as a ileostomy in the left quadrant.  The patient reports having some mild pain around the ileostomy site that is stable and unchanged.  The patient reports that she is quite unhappy at [facility] currently.  She states that there are some older woman that woke who take great care of her.  However, she reports that the young girls continued to be a problem.  She states that when the young girls that were the once working that she calls and the staff will tell them that they will be there in a minute and will actually spend a few hours before actually going to check in on the patient.  She states that the language is not an issue as they even had a person who spoke Nepalis at times.  She states that there was one episode where she called for assistance and ended up having one of the ostomy bags malfunction with contents spilling over her and had to spend all night without assistance as staff did not come in.  She reports that  she would now like to go back to the same facility and that she would like to be closer to her family as her family has a difficult time visiting her in this facility, and she has been away from her family for approximately 6 months.  PAST MEDICAL HISTORY: 1. Choledocholithiasis status post ERCP with sphincterotomy. 2. Status post exploratory laparotomy with duodenotomy and repair of     duodenal perforation with bleeding, open cholecystectomy, common     bile duct exploration and placement of P-tube. 3. Right colectomy and ileostomy. 4. Protein-calorie malnutrition requiring total parenteral nutrition. 5. Duodenocutaneous fistula. 6. Asthma. 7. Hypertension. 8. Paroxysmal atrial fibrillation. 9. Coronary artery disease. 10.History of  NSTEMI. 11.History of mild transaminitis thought to be secondary to total     parenteral nutrition.  MEDICATIONS:  I will come back to these.  ALLERGIES:  No known drug allergies.  SOCIAL HISTORY:  The patient is currently living at [facility].  Please refer to HPI for current concerns with the patient's concerns regarding the facility.  The patient has her son and daughter- in-law at bedside.  She states that her son is the one to be contacted and/or his wife in case of need for medical decision making.  Son's name is Neena Beecham, and phone number is [phone].  The patient is a nonsmoker, does not drink alcohol, and does not use drugs.  FAMILY HISTORY:  Noncontributory.  REVIEW OF SYSTEMS:  As per HPI.  PHYSICAL EXAMINATION:  VITAL SIGNS:  Temperature 96.9, blood pressure 115/73, heart rate of 88, respirations 18, oxygen saturation 100% on room air. GENERAL:  This is an older woman lying in bed in no acute distress. HEENT:  Head is normocephalic, atraumatic.  Pupils equally round and reactive to light.  Extraocular movements are intact.  Sclerae are anicteric. NECK:  Supple.  There is no JVD or carotid bruits. HEART:  There is a normal S1 and S2 with a regular rate and rhythm. There is no murmurs, gallops, or rubs. LUNGS:  Good air movement with clear auscultation bilaterally. ABDOMEN:  There is positive bowel sounds.  The patient has a T-tube placed in the lateral right upper quadrant along with an ostomy bag draining a duodenocutaneous fistula.  Furthermore, she has another ostomy bag in the a left quadrants for her ileostomy.  There is some mild tenderness to palpation over the left quadrant at the site of the ileostomy bag.  The ostomy itself looks healthy.  The T-tube has some what appears to be bilious contents. EXTREMITIES:  There is no edema. NEUROLOGIC:  The patient is awake, alert, and oriented.  Cranial nerves are grossly intact.  The patient is  moving all four extremities adequately and sensation appears grossly intact.  LABORATORY DATA:  WBC 6.5, hemoglobin 10.7, platelets 227.  Sodium 133, potassium 4.3, chloride 109, bicarb 20, glucose 113, BUN 38, creatinine 1.00, total bilirubin 1.6, alkaline phosphatase 260, AST 127, ALT 122, protein 7.9, albumin 3.0, calcium 9.7, lipase 50.  Urinalysis is negative.  Point-of-care cardiac markers are negative with a CK-MB of 1.7, troponin I less than 0.05, and a myoglobin of 129.  IMAGING: 1. CT of the abdomen and pelvis with contrast demonstrates an external     drain catheter, which extends from the right flank to the common     bile duct and through the ampule into the distal duodenum.  There     are no dilated intrahepatic bile ducts.  There is diminished and  biliary tree compared to prior exam.  Liver parenchyma is normal.     Spleen, pancreas, adrenal glands, and kidneys demonstrate no     significant abnormalities.  There is an ileostomy in the left     midabdomen.  The descending colon has been resected.  The distal     colon is decompressed.  There is an 11-mm cyst in the mid right     kidney that has increased in size compared to prior exam.  The     uterus and ovaries are atrophic.  There are no osseous     abnormalities, and impression is no acute abnormalities. 2. Electrocardiogram shows sinus rhythm with a ventricular rate of 96,     and overall rather normal appearing.  ASSESSMENT AND PLAN: 1. Nausea, vomiting/hematemesis x1.  It is of unclear etiology, but of     some concern given the patient's prior medical problems including     multiple surgeries with a duodenal perforation and bleeding.  At     this point, the patient's symptoms have subsided significantly, but     she does continue to complain of some epigastric pain described as     burning with occasional radiation into the chest raising the     concern for gastroesophageal reflux disease.  Given that she  is     hemodynamically stable and no further episodes of emesis and/or     bleeding, we will admit to telemetry and monitor closely.  We will     provide IV proton pump inhibitor.  We will need to follow     hemoglobin and can consider a Gastroenterology consultation in the     morning depending on the patient's symptoms and initial workup.     Furthermore, her abdominal examination is definitely not acute, and     her CT scan of the abdomen does not show any acute abnormalities.     Furthermore, her hemoglobin is stable/increased from prior in     February.  Also, there is no leukocytosis or fever to suggest an     infectious etiology.  Given the symptoms mentioned above, we will     keep n.p.o. currently with total parenteral nutrition as the     patient has been on this regardless of initiation of clear liquids     at the skilled nursing facility. 2. Elevated transaminases.  This was presumed to be secondary to total     parenteral nutrition during the last admission.  Currently, they     are higher than they were before approximately twice as much with     alkaline phosphatase at 260, AST, ALT of 127 and 122 respectively.     At this point, we will just monitor. 3. Anemia.  As mentioned above, hemoglobin is currently stable and     actually slightly higher than it was in the past.  Given nausea,     vomiting/hematemesis x1, we will use SCDs for deep vein thrombosis     prophylaxis instead of pharmacologic prophylaxis. 4. Ileostomy with a P-tube drain as well as duodenocutaneous fistula.     We will consult Wound Care to provide further ostomy care. 5. History of paroxysmal atrial fibrillation.  The patient is     currently in sinus rhythm.  At this point, we will continue the     patient's beta-blocker, Plavix, amiodarone. 6. Coronary artery disease status post non-ST elevation myocardial     infarction in August 2011.  The patient was recently discharged     with Plavix along with  beta-blocker, but no aspirin nor statin that     I can see.  We will go ahead and review the [facility] from the skilled     nursing facility, consider starting both of these.  Of note, the     aspirin and Plavix will need to be used with caution in the setting     of a possible upper gastrointestinal bleed. 7. Placement.  Again as mentioned above, please refer to the history     of present illness.  The patient is currently not happy at the     [facility] for reasons mentioned above.  We will consult     social work to see if there is any possibility of placement     elsewhere.  CODE STATUS:  The patient is a full code and in case of need for medical decision making, her son Wakisha Alberts should be contacted on phone number [phone].     Mariea Stable, MD     MA/MEDQ  D:  01/21/2011  T:  01/21/2011  Job:  [id number]  cc:   [facility] Bevelyn Buckles. Bensimhon, MD  Electronically Signed by Mariea Stable MD on 01/23/2011 07:24:56 AM

## 2011-01-24 LAB — CARDIAC PANEL(CRET KIN+CKTOT+MB+TROPI)
CK, MB: 1.7 ng/mL (ref 0.3–4.0)
CK, MB: 1.9 ng/mL (ref 0.3–4.0)
Relative Index: INVALID (ref 0.0–2.5)
Total CK: 55 U/L (ref 7–177)
Troponin I: 0.03 ng/mL (ref 0.00–0.06)
Troponin I: 0.03 ng/mL (ref 0.00–0.06)

## 2011-01-24 LAB — URINE CULTURE
Colony Count: 60000
Culture  Setup Time: 201203150110

## 2011-01-24 LAB — GLUCOSE, CAPILLARY
Glucose-Capillary: 107 mg/dL — ABNORMAL HIGH (ref 70–99)
Glucose-Capillary: 116 mg/dL — ABNORMAL HIGH (ref 70–99)
Glucose-Capillary: 120 mg/dL — ABNORMAL HIGH (ref 70–99)

## 2011-01-25 LAB — CBC
Platelets: 252 10*3/uL (ref 150–400)
RBC: 3.73 MIL/uL — ABNORMAL LOW (ref 3.87–5.11)
RDW: 13.5 % (ref 11.5–15.5)
WBC: 9.1 10*3/uL (ref 4.0–10.5)

## 2011-01-25 LAB — COMPREHENSIVE METABOLIC PANEL
ALT: 71 U/L — ABNORMAL HIGH (ref 0–35)
AST: 47 U/L — ABNORMAL HIGH (ref 0–37)
Albumin: 3 g/dL — ABNORMAL LOW (ref 3.5–5.2)
Alkaline Phosphatase: 210 U/L — ABNORMAL HIGH (ref 39–117)
Chloride: 107 mEq/L (ref 96–112)
GFR calc Af Amer: 49 mL/min — ABNORMAL LOW (ref >60)
Potassium: 4.7 mEq/L (ref 3.5–5.1)
Sodium: 132 mEq/L — ABNORMAL LOW (ref 135–145)
Total Bilirubin: 0.6 mg/dL (ref 0.3–1.2)
Total Protein: 7.6 g/dL (ref 6.0–8.3)

## 2011-01-25 LAB — GLUCOSE, CAPILLARY
Glucose-Capillary: 122 mg/dL — ABNORMAL HIGH (ref 70–99)
Glucose-Capillary: 149 mg/dL — ABNORMAL HIGH (ref 70–99)

## 2011-01-26 ENCOUNTER — Encounter: Payer: Self-pay | Admitting: Gastroenterology

## 2011-01-26 DIAGNOSIS — K449 Diaphragmatic hernia without obstruction or gangrene: Secondary | ICD-10-CM

## 2011-01-26 DIAGNOSIS — R112 Nausea with vomiting, unspecified: Secondary | ICD-10-CM

## 2011-01-26 LAB — CBC
HCT: 29.6 % — ABNORMAL LOW (ref 36.0–46.0)
Hemoglobin: 10 g/dL — ABNORMAL LOW (ref 12.0–15.0)
MCH: 28.6 pg (ref 26.0–34.0)
RBC: 3.5 MIL/uL — ABNORMAL LOW (ref 3.87–5.11)

## 2011-01-26 LAB — DIFFERENTIAL
Basophils Relative: 1 % (ref 0–1)
Lymphocytes Relative: 26 % (ref 12–46)
Lymphs Abs: 2 10*3/uL (ref 0.7–4.0)
Monocytes Relative: 11 % (ref 3–12)
Neutro Abs: 4.7 10*3/uL (ref 1.7–7.7)
Neutrophils Relative %: 60 % (ref 43–77)

## 2011-01-26 LAB — COMPREHENSIVE METABOLIC PANEL
ALT: 107 U/L — ABNORMAL HIGH (ref 0–35)
AST: 95 U/L — ABNORMAL HIGH (ref 0–37)
CO2: 19 mEq/L (ref 19–32)
Calcium: 9.2 mg/dL (ref 8.4–10.5)
GFR calc Af Amer: 49 mL/min — ABNORMAL LOW (ref >60)
Potassium: 4.3 mEq/L (ref 3.5–5.1)
Sodium: 127 mEq/L — ABNORMAL LOW (ref 135–145)
Total Protein: 7.2 g/dL (ref 6.0–8.3)

## 2011-01-26 LAB — CHOLESTEROL, TOTAL: Cholesterol: 126 mg/dL (ref 0–200)

## 2011-01-27 DIAGNOSIS — I2510 Atherosclerotic heart disease of native coronary artery without angina pectoris: Secondary | ICD-10-CM

## 2011-01-27 DIAGNOSIS — K449 Diaphragmatic hernia without obstruction or gangrene: Secondary | ICD-10-CM

## 2011-01-27 DIAGNOSIS — R112 Nausea with vomiting, unspecified: Secondary | ICD-10-CM

## 2011-01-27 LAB — MAGNESIUM: Magnesium: 2.2 mg/dL (ref 1.5–2.5)

## 2011-01-27 LAB — BASIC METABOLIC PANEL WITH GFR
BUN: 52 mg/dL — ABNORMAL HIGH (ref 6–23)
CO2: 20 meq/L (ref 19–32)
Calcium: 8.9 mg/dL (ref 8.4–10.5)
Chloride: 107 meq/L (ref 96–112)
Creatinine, Ser: 1.32 mg/dL — ABNORMAL HIGH (ref 0.4–1.2)
GFR calc non Af Amer: 40 mL/min — ABNORMAL LOW
Glucose, Bld: 111 mg/dL — ABNORMAL HIGH (ref 70–99)
Potassium: 4.3 meq/L (ref 3.5–5.1)
Sodium: 131 meq/L — ABNORMAL LOW (ref 135–145)

## 2011-01-27 LAB — GLUCOSE, CAPILLARY
Glucose-Capillary: 137 mg/dL — ABNORMAL HIGH (ref 70–99)
Glucose-Capillary: 120 mg/dL — ABNORMAL HIGH (ref 70–99)

## 2011-01-28 DIAGNOSIS — K449 Diaphragmatic hernia without obstruction or gangrene: Secondary | ICD-10-CM

## 2011-01-28 DIAGNOSIS — R110 Nausea: Secondary | ICD-10-CM

## 2011-01-28 DIAGNOSIS — R112 Nausea with vomiting, unspecified: Secondary | ICD-10-CM

## 2011-01-29 DIAGNOSIS — R112 Nausea with vomiting, unspecified: Secondary | ICD-10-CM

## 2011-01-29 DIAGNOSIS — K449 Diaphragmatic hernia without obstruction or gangrene: Secondary | ICD-10-CM

## 2011-01-29 LAB — COMPREHENSIVE METABOLIC PANEL
ALT: 103 U/L — ABNORMAL HIGH (ref 0–35)
AST: 75 U/L — ABNORMAL HIGH (ref 0–37)
Albumin: 2.9 g/dL — ABNORMAL LOW (ref 3.5–5.2)
Alkaline Phosphatase: 251 U/L — ABNORMAL HIGH (ref 39–117)
Calcium: 8.6 mg/dL (ref 8.4–10.5)
GFR calc Af Amer: 40 mL/min — ABNORMAL LOW (ref >60)
Glucose, Bld: 129 mg/dL — ABNORMAL HIGH (ref 70–99)
Potassium: 4.6 mEq/L (ref 3.5–5.1)
Sodium: 139 mEq/L (ref 135–145)
Total Protein: 7 g/dL (ref 6.0–8.3)

## 2011-01-29 NOTE — Progress Notes (Signed)
[name], [name]                 ACCOUNT NO.:  [id number]  MEDICAL RECORD NO.:  [id number]           PATIENT TYPE:  I  LOCATION:  3019                         FACILITY:  [facility]  PHYSICIAN:  Marinda Elk, M.D.DATE OF BIRTH:  1941-01-14                                PROGRESS NOTE   CARDIOLOGIST: Bevelyn Buckles. Bensimhon, MD.  PRIMARY CARE DOCTOR: [facility] and followed by [facility] by Dr. Leanord Hawking.  DIAGNOSES: 1. Nausea and vomiting status post EGD with no significant lesions on     high dose PPI. 2. Transaminitis secondary to T&A. 3. Anemia of chronic disease. 4. History of paroxysmal atrial fibrillation, currently on amiodarone     and beta-blocker. 5. History of exploratory laparotomy status post lysis of adhesions     and duodenectomy with repair of duodenal perforation secondary to     bleeding. 6. History of cholecystectomy with common bile duct exploration with     placement of a G-tube. 7. History of exploratory laparotomy with adhesion in November 2008     with a right colectomy and ileostomy.  PROCEDURES PERFORMED: EGD showed small hiatal hernia.  He was placed on internal and external biliary drain and extended into the duodenum, otherwise normal examination.  No esophagitis, no gastritis.  No clear cause of nausea and vomiting.  CT scan of the abdomen and pelvis showed no acute abnormalities.  Abdominal x-ray that showed no active pauses evident in chest or abdomen, percutaneous catheter.  This dictation covers from January 21, 2011 to January 27, 2011.  BRIEF HOSPITAL COURSE: 1. Nausea and vomiting.  She was admitted to the floor under     observations.  This was thought to be secondary to previous surgery.     Surgery was consulted.  They related     that everything was on the right track.  She had no complications     from this.  She was started on high-dose PPI for GERD with no     improvement.  The patient has no history of diabetes.  So doubt   this is gastroparesis.  GI was consulted.  They recommended an EGD     which was negative.  No gastritis.  GI thought that this might be     probably secondary to amiodarone, so cardiology was consulted.  The     patient is on amiodarone for history of paroxysmal AFib, she is     currently rate controlled.  Consult to cardiology was pending at     this time. 2. Transaminitis probably secondary to T and A. 3. Anemia of chronic disease, currently stable. 4. History of paroxysmal AFib.  She is sinus rhythm, currently rate controlled on     amiodarone and beta-blocker.  GI thinks that amiodarone in this     female might be causing her nausea.  I agree with that.  At this     time we are hesitant to     take her off her amiodarone, so I have consulted cardiology to make     recommendations which is pending at the time of this dictation.  All the other medical problems are currently stable.     Marinda Elk, M.D.     AF/MEDQ  D:  01/27/2011  T:  01/27/2011  Job:  [id number]  Electronically Signed by Lambert Keto M.D. on 01/29/2011 08:44:34 AM

## 2011-01-30 LAB — BASIC METABOLIC PANEL
GFR calc Af Amer: 46 mL/min — ABNORMAL LOW (ref >60)
GFR calc non Af Amer: 38 mL/min — ABNORMAL LOW (ref >60)
Potassium: 4 mEq/L (ref 3.5–5.1)
Sodium: 137 mEq/L (ref 135–145)

## 2011-02-04 ENCOUNTER — Emergency Department (HOSPITAL_COMMUNITY)
Admission: EM | Admit: 2011-02-04 | Discharge: 2011-02-04 | Disposition: A | Discharge status: Home / Self Care | Payer: Medicaid Other | Attending: Emergency Medicine | Admitting: Emergency Medicine

## 2011-02-04 DIAGNOSIS — Z79899 Other long term (current) drug therapy: Secondary | ICD-10-CM | POA: Insufficient documentation

## 2011-02-04 DIAGNOSIS — R110 Nausea: Secondary | ICD-10-CM | POA: Insufficient documentation

## 2011-02-04 DIAGNOSIS — I4891 Unspecified atrial fibrillation: Secondary | ICD-10-CM | POA: Insufficient documentation

## 2011-02-04 DIAGNOSIS — I252 Old myocardial infarction: Secondary | ICD-10-CM | POA: Insufficient documentation

## 2011-02-04 DIAGNOSIS — J45909 Unspecified asthma, uncomplicated: Secondary | ICD-10-CM | POA: Insufficient documentation

## 2011-02-04 DIAGNOSIS — IMO0002 Reserved for concepts with insufficient information to code with codable children: Secondary | ICD-10-CM | POA: Insufficient documentation

## 2011-02-04 DIAGNOSIS — I10 Essential (primary) hypertension: Secondary | ICD-10-CM | POA: Insufficient documentation

## 2011-02-04 DIAGNOSIS — E785 Hyperlipidemia, unspecified: Secondary | ICD-10-CM | POA: Insufficient documentation

## 2011-02-05 NOTE — Procedures (Signed)
Summary: Upper Endoscopy  Patient: [name] Note: All result statuses are Final unless otherwise noted.  Tests: (1) Upper Endoscopy (EGD)   EGD Upper Endoscopy       DONE      [facility]     [address]     [place], [place]  [address]          ENDOSCOPY PROCEDURE REPORT          PATIENT:  [name], [name]  MR#:  [id number]     BIRTHDATE:  [birth year], 70 yrs. old  GENDER:  female     ENDOSCOPIST:  Rachael Fee, MD     PROCEDURE DATE:  01/26/2011     PROCEDURE:  EGD, diagnostic 43235     ASA CLASS:  Class III     INDICATIONS:  s/p ERCP with perforation, bleeding; open surgery to     repair with t tube placement, also segmental colectomy and     colostomy     MEDICATIONS:  Fentanyl 75 mcg IV, Versed 5 mg IV     TOPICAL ANESTHETIC:  Cetacaine Spray          DESCRIPTION OF PROCEDURE:   After the risks benefits and     alternatives of the procedure were thoroughly explained, informed     consent was obtained.  The EG-[id number] ([id number]) endoscope was     introduced through the mouth and advanced to the second portion of     the duodenum, without limitations.  The instrument was slowly     withdrawn as the mucosa was fully examined.     <<PROCEDUREIMAGES>>     The internal/external biliary drain (T tube replacement drain) was     seen extending into the duodenum.  There was a small, 1cm hiatal     hernia.  Otherwise the examination was normal (see image002,     image003, and image004).   The scope was then withdrawn from the     patient and the procedure completed.     COMPLICATIONS:  None          ENDOSCOPIC IMPRESSION:     1) Small hiatal hernia     2) Previously placed internal/external biliary drain extends     into the duodenum     3) Otherwise normal examination' no esophagitis, no gastritis          No clear cause of her nausea/vomiting. No anatomic reason on     this examination.          RECOMMENDATIONS:     I will change her anti-nausea  meds to scheduled dosing and will     see how she responds clinically.          ______________________________     Rachael Fee, MD          cc: Claudette Head, MD          n.     Rosalie Doctor:   Rachael Fee at 01/26/2011 01:41 PM          Doneta Public, [phone]  Note: An exclamation mark (!) indicates a result that was not dispersed into the flowsheet. Document Creation Date: 01/26/2011 1:42 PM _______________________________________________________________________  (1) Order result status: Final Collection or observation date-time: 01/26/2011 13:30 Requested date-time:  Receipt date-time:  Reported date-time:  Referring Physician:   Ordering Physician: Rob Bunting [phone]) Specimen Source:  Source: Launa Grill Order Number: [id number] Lab site:

## 2011-02-09 ENCOUNTER — Other Ambulatory Visit (HOSPITAL_COMMUNITY): Payer: Self-pay | Admitting: General Surgery

## 2011-02-09 DIAGNOSIS — Z9049 Acquired absence of other specified parts of digestive tract: Secondary | ICD-10-CM

## 2011-02-12 NOTE — Consult Note (Signed)
NAMEBERNIS, [name]                 ACCOUNT NO.:  [id number]  MEDICAL RECORD NO.:  [id number]           PATIENT TYPE:  I  LOCATION:  3019                         FACILITY:  [facility]  PHYSICIAN:  Verne Carrow, MDDATE OF BIRTH:  Apr 21, 1941  DATE OF CONSULTATION:  01/27/2011 DATE OF DISCHARGE:                                CONSULTATION   PRIMARY CARDIOLOGIST:  Bevelyn Buckles. Bensimhon, MD.  PRIMARY CARE PHYSICIAN:  Located at [facility].  REASON FOR CONSULTATION:  Question of amiodarone seeming significant contributing factor to protracted nausea and vomiting.  HISTORY OF PRESENT ILLNESS:  [name] is a 70 year old [place] female with a known history of coronary artery disease, having had NSTEMI with subsequent cardiac catheterization in August 2011, that showed 100% RCA, 60% LAD, and 80% circumflex artery with slight LV dysfunction, LVEF 45- 50%, also known PAF, not thought to be a good Coumadin candidate, but on amiodarone 200 mg p.o. daily as well as rate control with Lopressor. 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  She had ERCP and sphincterectomy and duodenotomy.  She has also had a right colectomy and ileostomy and she currently has a T tube in place.  Per Surgery notes, her fistula is healed.  She recently had GI workup and this included EGD but unfortunately this does not eliminate the source of her chief complaint which is nausea.  She also had some emesis but none in the last several days.  This emesis included some blood-tinged fluid but the patient's blood count had been stable. She has currently elevated transaminases, this is thought to be secondary to TPA which was continued secondary to protein-calorie malnutrition in her previous GI procedures.  Currently, the patient continues to complain of nausea.  She has had no emesis recently.  She has epigastric discomfort,  but no chest pain, no shortness of breath, no palpitations, no pre/syncope or lightheadedness, no fevers or chills.  She has chronic cough that is unchanged.  She has had no lower extremity edema or orthopnea.  She is simply frustrated with her ongoing epigastric discomfort and nausea.  EKG confirms prior tracing completed this admission that the patient is in normal sinus rhythm with no acute changes.  Vital signs very close to were within normal limits and stable.  The patient has chronic renal insufficiency, otherwise labs unremarkable other than elevated transaminases as above.  PAST MEDICAL HISTORY: 1. CAD.     a.     NSTEMI, cardiac catheterization in August 2011 (RCA 100%,      LAD 60%, circumflex 80%, LVEF 45-50%). 2. PAF.     a.     Poor Coumadin candidate, amiodarone 200 mg p.o. daily, rate      controlled with Lopressor. 3. History of choledocholithiasis and duodenocutaneous fistula.     a.     S/P ERCP, sphincterotomy, duodenotomy, placement of P2. 4. S/P colectomy and ileostomy. 5. Renal insufficiency. 6. Hypertension. 7. Protein-calorie malnutrition.     a.     On TPN. 8. Asthma. 9. 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  SOCIAL HISTORY:  The patient lives at [facility].  She has no tobacco, EtOH, or illicit drug use history.  No herbal meds.  She is on TPA.  No regular exercise but is active without chest discomfort.  FAMILY HISTORY:  Negative for premature diagnosis of coronary artery disease.  No other significant medical history.  REVIEW OF SYSTEMS:  Please see HPI.  All other systems were reviewed and were negative.  CODE STATUS:  Full.  ALLERGIES:  NKDA.  MEDICATIONS: 1. Amiodarone 200 mg p.o. daily. 2. Flovent 1 puff b.i.d. 3. Xopenex 1.25 p.o. b.i.d. 4. Reglan 10 mg p.o. at bedtime 5. Lopressor 12.5 mg p.o. b.i.d. 6. Nitro daily. 7. Zofran 4 mg p.o. b.i.d. 8. Protonix 40 mg p.o. b.i.d. 9. Phenergan 12.5 mg p.o. at  bedtime. 10.P.r.n. medications.  PHYSICAL EXAMINATION:  VITAL SIGNS:  T-max 98.5 degrees Fahrenheit, current temp 97.9 degrees Fahrenheit, BP ranging from 95-109/62-77 with pulse 82-105, respiration rate 16-20, O2 saturation 96% on room air. GENERAL:  The patient is alert and oriented x3.  She is in no apparent distress.  She is able to speak in full sentences without respiratory distress and when interviewed, she was lying with her head at about 15 degrees of elevation. HEENT:  Head is normocephalic, atraumatic.  Pupils equal, round, and reactive to light.  Extraocular muscles are intact.  Nares are patent without discharge.  Dentition is poor.  Oropharynx is without erythema or exudates. NECK:  Supple without lymphadenopathy.  No thyromegaly.  No JVD. HEART:  Rate is regular with audible S1 and S2 although the heart sounds are distant.  No clear clicks, rubs, murmurs, or gallops.  Pulses are 2+ and equal in both upper and lower extremities bilaterally. LUNGS:  Clear to auscultation bilaterally. SKIN:  No rashes, lesions, or petechiae. ABDOMEN:  Soft, mildly tender throughout, especially in the epigastric area.  Hypoactive bowel sounds.  No rebound or guarding.  No hepatosplenomegaly. EXTREMITIES:  Without clubbing, cyanosis, or edema. MUSCULOSKELETAL:  Without joint deformity or effusions.  No spinal or CVA tenderness. NEUROLOGIC:  Cranial nerves II-XII grossly intact.  Strength 5/5 in all extremities and axial groups.  Normal sensation throughout and normal cerebellar function.  RADIOLOGY:  EGD showed no clear cause of nausea/vomiting.  No anatomic reason noted on that exam.  EKG:  Normal sinus rhythm with sinus arrhythmia, rate 99 bpm, no significant ST changes, Q-waves in inferior leads.  Normal axis.  No evidence of hypertrophy.  PR 174, QRS 76, and QTc 459.  No significant change from prior tracing from December 2011.  LABS:  WBC is 7.8 with normal differential, HGB 10.0,  HCT 29.6, PLT count 274.  Sodium 131, potassium 4.3, chloride 107, bicarb 20, BUN 52, creatinine 1.32, glucose 111.  Total bilirubin 0.7, alkaline phosphatase 208, AST 95, ALT 107, albumin 3.0, magnesium 2.2, total cholesterol 126, and triglycerides 125.  ASSESSMENT AND PLAN:  The patient was initially seen by Lenard Galloway, PAC, and soon to be seen and thoroughly assessed by attending cardiologist, Dr. Verne Carrow.  Please see his addendum for any significant changes to this assessment and plan.  [name] is a 70 year old non-English speaking female with a known history of coronary artery disease, having had non-ST segment elevation myocardial infarction with subsequent cardiac catheterization in August 2011, revealing 100% right coronary artery, 60% left anterior descending coronary artery, 80% circumflex artery, and 45-50% EF, known paroxysmal atrial fibrillation, initiated on amiodarone several months ago and not thought to  be good Coumadin candidate, as well as extensive GI history, but with recent EGD and GI evaluation that cannot explain the reason for her admission (protracted nausea initially with vomiting).  Cardiology now consulted for question of significant contributing factor of amiodarone therapy to her nausea.  Nausea - It seems unlikely that the amiodarone is playing a significant role given the long history of her therapy with this medication but as there is no other clear etiology and likely not significant benefit at this time from amiodarone, would discontinue and monitor symptoms.  The patient is currently on no medications for decreasing her thromboembolic risk and have discussed with Gastroenterology and per notes from Surgery, there does not seem to be contraindication to aspirin, so we will initiate enteric-coated aspirin 325 mg p.o. daily.  We will increase her Lopressor from 12.5 p.o. b.i.d. to q.8 hours.  Should the patient require antiarrhythmic  therapy in the future, would likely benefit most from Tikosyn therapy given known history of coronary artery disease and apparent intolerance to amiodarone.  However, whether or not her nausea is being caused by amiodarone therapy, remains to be same, we will discontinue and follow closely.     Jarrett Ables, PAC   ______________________________ Verne Carrow, MD    MS/MEDQ  D:  01/27/2011  T:  01/28/2011  Job:  [id number]  Electronically Signed by Jarrett Ables PAC on 02/12/2011 03:01:43 PM Electronically Signed by Verne Carrow MD on 02/12/2011 05:47:49 PM

## 2011-02-16 ENCOUNTER — Inpatient Hospital Stay (HOSPITAL_COMMUNITY)
Admission: EM | Admit: 2011-02-16 | Discharge: 2011-02-20 | DRG: 683 | Disposition: A | Discharge status: Home Health | Payer: Medicaid Other | Attending: Internal Medicine | Admitting: Internal Medicine

## 2011-02-16 DIAGNOSIS — M129 Arthropathy, unspecified: Secondary | ICD-10-CM | POA: Diagnosis present

## 2011-02-16 DIAGNOSIS — I4891 Unspecified atrial fibrillation: Secondary | ICD-10-CM | POA: Diagnosis present

## 2011-02-16 DIAGNOSIS — Z7982 Long term (current) use of aspirin: Secondary | ICD-10-CM

## 2011-02-16 DIAGNOSIS — E440 Moderate protein-calorie malnutrition: Secondary | ICD-10-CM | POA: Diagnosis present

## 2011-02-16 DIAGNOSIS — N179 Acute kidney failure, unspecified: Principal | ICD-10-CM | POA: Diagnosis present

## 2011-02-16 DIAGNOSIS — IMO0002 Reserved for concepts with insufficient information to code with codable children: Secondary | ICD-10-CM

## 2011-02-16 DIAGNOSIS — N189 Chronic kidney disease, unspecified: Secondary | ICD-10-CM | POA: Diagnosis present

## 2011-02-16 DIAGNOSIS — N390 Urinary tract infection, site not specified: Secondary | ICD-10-CM | POA: Diagnosis present

## 2011-02-16 DIAGNOSIS — R627 Adult failure to thrive: Secondary | ICD-10-CM | POA: Diagnosis present

## 2011-02-16 DIAGNOSIS — E871 Hypo-osmolality and hyponatremia: Secondary | ICD-10-CM | POA: Diagnosis present

## 2011-02-16 DIAGNOSIS — I252 Old myocardial infarction: Secondary | ICD-10-CM

## 2011-02-16 DIAGNOSIS — J45909 Unspecified asthma, uncomplicated: Secondary | ICD-10-CM | POA: Diagnosis present

## 2011-02-16 DIAGNOSIS — I129 Hypertensive chronic kidney disease with stage 1 through stage 4 chronic kidney disease, or unspecified chronic kidney disease: Secondary | ICD-10-CM | POA: Diagnosis present

## 2011-02-17 ENCOUNTER — Emergency Department (HOSPITAL_COMMUNITY): Payer: Medicaid Other

## 2011-02-17 ENCOUNTER — Other Ambulatory Visit (HOSPITAL_COMMUNITY): Payer: Medicaid Other

## 2011-02-17 LAB — CBC
HCT: 37 % (ref 36.0–46.0)
HCT: 33.4 % — ABNORMAL LOW (ref 36.0–46.0)
MCV: 83.9 fL (ref 78.0–100.0)
Platelets: 245 10*3/uL (ref 150–400)
Platelets: 232 10*3/uL (ref 150–400)
RBC: 4.41 MIL/uL (ref 3.87–5.11)
RDW: 12.8 % (ref 11.5–15.5)
WBC: 8.6 10*3/uL (ref 4.0–10.5)
WBC: 8.4 10*3/uL (ref 4.0–10.5)

## 2011-02-17 LAB — URINALYSIS, ROUTINE W REFLEX MICROSCOPIC
Glucose, UA: NEGATIVE mg/dL
Hgb urine dipstick: NEGATIVE
Specific Gravity, Urine: 1.018 (ref 1.005–1.030)
Urobilinogen, UA: 0.2 mg/dL (ref 0.0–1.0)

## 2011-02-17 LAB — URINE MICROSCOPIC-ADD ON

## 2011-02-17 LAB — DIFFERENTIAL
Lymphocytes Relative: 30 % (ref 12–46)
Lymphs Abs: 2.6 10*3/uL (ref 0.7–4.0)
Neutrophils Relative %: 61 % (ref 43–77)

## 2011-02-17 LAB — COMPREHENSIVE METABOLIC PANEL
ALT: 97 U/L — ABNORMAL HIGH (ref 0–35)
AST: 87 U/L — ABNORMAL HIGH (ref 0–37)
Alkaline Phosphatase: 443 U/L — ABNORMAL HIGH (ref 39–117)
CO2: 15 mEq/L — ABNORMAL LOW (ref 19–32)
GFR calc Af Amer: 15 mL/min — ABNORMAL LOW (ref >60)
Glucose, Bld: 99 mg/dL (ref 70–99)
Potassium: 5.4 mEq/L — ABNORMAL HIGH (ref 3.5–5.1)
Sodium: 129 mEq/L — ABNORMAL LOW (ref 135–145)
Total Protein: 8.2 g/dL (ref 6.0–8.3)

## 2011-02-17 LAB — CARDIAC PANEL(CRET KIN+CKTOT+MB+TROPI)
Relative Index: INVALID (ref 0.0–2.5)
Total CK: 33 U/L (ref 7–177)

## 2011-02-17 LAB — LACTIC ACID, PLASMA: Lactic Acid, Venous: 0.7 mmol/L (ref 0.5–2.2)

## 2011-02-17 LAB — TROPONIN I: Troponin I: 0.06 ng/mL (ref 0.00–0.06)

## 2011-02-17 LAB — POCT CARDIAC MARKERS: Myoglobin, poc: 136 ng/mL (ref 12–200)

## 2011-02-18 ENCOUNTER — Inpatient Hospital Stay (HOSPITAL_COMMUNITY): Payer: Medicaid Other

## 2011-02-18 LAB — BASIC METABOLIC PANEL
BUN: 32 mg/dL — ABNORMAL HIGH (ref 6–23)
CO2: 16 mEq/L — ABNORMAL LOW (ref 19–32)
Chloride: 116 mEq/L — ABNORMAL HIGH (ref 96–112)
Creatinine, Ser: 2.2 mg/dL — ABNORMAL HIGH (ref 0.4–1.2)

## 2011-02-18 LAB — URINE CULTURE

## 2011-02-18 LAB — CBC
Hemoglobin: 9.7 g/dL — ABNORMAL LOW (ref 12.0–15.0)
MCH: 28.6 pg (ref 26.0–34.0)
MCV: 84.1 fL (ref 78.0–100.0)
RBC: 3.39 MIL/uL — ABNORMAL LOW (ref 3.87–5.11)

## 2011-02-18 MED ORDER — IOHEXOL 300 MG/ML  SOLN
10.0000 mL | Freq: Once | INTRAMUSCULAR | Status: AC | PRN
  Administered 2011-02-18: 10 mL via INTRAVENOUS

## 2011-02-19 LAB — BASIC METABOLIC PANEL
BUN: 15 mg/dL (ref 6–23)
GFR calc non Af Amer: 33 mL/min — ABNORMAL LOW (ref >60)
Potassium: 3.5 mEq/L (ref 3.5–5.1)
Sodium: 140 mEq/L (ref 135–145)

## 2011-02-19 LAB — IRON AND TIBC: TIBC: 194 ug/dL — ABNORMAL LOW (ref 250–470)

## 2011-02-19 LAB — TSH: TSH: 1.486 u[IU]/mL (ref 0.350–4.500)

## 2011-02-19 NOTE — Consult Note (Signed)
[name], [name]                 ACCOUNT NO.:  [id number]  MEDICAL RECORD NO.:  [id number]           PATIENT TYPE:  O  LOCATION:  4731                         FACILITY:  [facility]  PHYSICIAN:  Sandria Bales. Ezzard Standing, M.D.  DATE OF BIRTH:  Oct 28, 1941  DATE OF CONSULTATION:  01/22/2011                                CONSULTATION   REFERRING PHYSICIAN:  Mariea Stable, MD  CHIEF COMPLAINT:  Hemoptysis, nausea and vomiting.  BRIEF HISTORY:  The patient is a very complex 70 year old [place] lady who presented with choledocholithiasis back in November 2011.  She underwent an ERCP and a sphincterotomy with subsequent bleeding and a perforated duodenum.  She required exploratory surgery with controlled bleeding, T tube was placed and repair of the cutaneous fistulas.  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    Since that time, the patient has been n.p.o. and on TNA.  Her last surgery was on October 07, 2010.  Over the interim, she has recovered fairly well from her surgeries and she has done relatively well on the TNA, although the patient has never been able to understand the extent of her problem or the treatment for it. She speaks no [place], nor does most of her family.  She ultimately found placement in a long-term care facility and was transferred to that facility on December 23, 2010.  She has recently undergone upper GI on January 12, 2011, ordered by Dr. Carolynne Edouard.  This showed percutaneous biliary drain was in place with no leak.  At that point, the patient was subsequently started on some liquids, although I do not have a complete records of that interchange.  Yesterday, she reports drinking someone lentil soup and some apple.  She had nausea and vomited 3 times and said there was blood in it.  She has also been drinking fruit juice.  The patient  reports she has had some trouble swallowing  since they started her back on p.o.'s.  All this is obtained through the interpreter, it is very difficult to get good answers from the patient about her problem.  There is  good deal of anguish, over her condition, seperation from family, along with concerns over pain she was having overnight.  She also had concerns over having the ileostomy reversed, the T-tube removed, and been able to go home all interspersed with her current problem. Labs on admission showed elevated SGOT/SGPT.  Currently, we are asked to see the patient in regards to her elevated transaminase.  PAST MEDICAL HISTORY: 1. Status post choledocholithiasis with ERCP, sphincterotomy,     bleeding, and perforation of the duodenum.  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     This hospitalization was from September 13, 2010, through December 23, 2010. 2. History of non-ST elevation MI in August 2011. 3. History of AFib. 4. Hypertension. 5. Asthma. 6. Protein malnutrition.  PROCEDURES: 1. Duodenal perforation with exploratory laparotomy, lysis of     adhesions,  repair of perforations, open cholecystectomy, common     bile duct exploration, placement of T-tube on September 18, 2010. 2. Status post right colectomy and lysis of adhesions; ileostomy     secondary to cutaneous fistulas and necrotic right colon. 3. Successful exchange of the common bile duct drain on December 10, 2010, at [facility]. 4. CT of the abdomen and pelvis on January 21, 2011, shows external     drain catheter, which extends from the right flank and the common     bile duct through the ampule into the distal duodenum.  There is no     dilated intrahepatic bile ducts, diminished air in the biliary     tree.  Liver parenchyma is normal, spleen, pancreas, adrenals, and     kidneys are normal.  There is an ileostomy in the  mid left abdomen.     The ascending colon has been resected.  The distal colon is     decompressed.  There was 11-cm cyst in the right kidney which is     increased in size, uterus and ovaries were atrophic. 5. Cardiac catheterization on June 27, 2010, showed a 60% LAD, 80%     circ, RCA was completely occluded, and EF was 45-50%.  FAMILY HISTORY:  Noncontributory.  SOCIAL HISTORY:  No tobacco, alcohol, or drugs.  She has a refugee from [place] and is currently residing in a skilled nursing facility.  She speaks only [place].  REVIEW OF SYSTEMS:  Difficult to obtain.  FEVER:  None.  SKIN:  No changes that I can obtain.  Positive for nausea.  She complains of chest burning and from her throat to her shin, but does not correlate with her prior cardiac pain.  GASTROINTESTINAL:  Questionable GERD.  Positive for nausea and vomiting yesterday. Trouble swallowing since PO'S restarted.  ABDOMEN:  Has not been tender.  SKIN: No changes.  GENITOURINARY:  No changes.  PULMONARY:  Still some wheezing, doing fairly well on inhalers.  MUSCULOSKELETAL:  No changes. PSYCHIATRIC:  No changes.  CURRENT MEDICATIONS: 1. She is on TNA. 2. Albuterol 2 puffs q.4 h. p.r.n. 3. Tylenol p.r.n. 4. Atropine p.r.n. 5. Lorazepam 1 mg daily. 6. Nitroglycerin 0.4 p.r.n. 7. Zofran p.r.n. 8. Flovent 1 puff b.i.d. 9. Xopenex 1.25 mg b.i.d. 10.Nitroglycerin transdermal patch 0.2 mg per hour. 11.Tylenol. 12.Chlorhexidine. 13.Morphine. 14.She is also on metoprolol 12.5 mg b.i.d. 15.Amiodarone 200 mg daily.  ALLERGIES:  None known.  PHYSICAL EXAMINATION:  GENERAL:  This is a thin, [place] female in no acute distress, very anxious and distressed over her social and medical situation. VITAL SIGNS:  Temperature is 98, heart rate is 84, blood pressure is 102/70, respiratory rate is 20, and sats are 99% on room air. HEENT:  Head:  Normocephalic.  Ears, nose, throat, and mouth are grossly normal.  Dentition is in  poor repair. NECK:  Trachea is in midline.  Thyroid is nonpalpable.  No JVD.  No thyromegaly. CARDIAC:  No murmurs, rubs, or gallops.  Normal S1, S2, slightly tachycardic.  Pulses are +2 and equal. CHEST:  Nontender to palpation. RESPIRATORY:  Effort is normal.  She has some rhonchi more on the right than the left. She is not wheezing currently. ABDOMEN:  Abnormal.  She has a T-tube catheter draining from her right side and she has an ileostomy in the mid left side of her abdomen.  The midline has healed in.  The abdomen is slightly  tender, but in no specific area. GENITOURINARY/RECTAL:  Deferred. LYMPHADENOPATHY:  None palpated. MUSCULOSKELETAL:  Within normal limits. SKIN:  No changes except for incisions and tubes as noted above. NEUROLOGIC:  Cranial nerves are grossly intact. PSYCHIATRIC:  She is very anxious.  LABORATORY DATA:  Prealbumin is 24.  CMP shows her electrolytes are normal.  BUN is 36, creatinine is 1.28, total bilirubin is 1.6, alk phos 303, SGOT is 149, and SGPT is 162.  White count is 8.2, hemoglobin 11.4, hematocrit 33, and platelets are 243,000.  Triglycerides are 126.  CK is normal.  UA is normal.  Lipase is 50, normal.  DIAGNOSTICS:  As above.  IMPRESSION: 1. Hemoptysis and trouble swallowing. 2. Elevated LFTs on TNA. 3. History of duodenal perforation with duodenocutaneous fistula     requiring right colectomy, lysis of adhesions, ileostomy and long-     term T-tube placement. 4. History of myocardial infarction. 5. History of atrial fibrillation on metoprolol and amiodarone. 6. Asthma. 7. Hypertension.  PLAN:  From a surgical standpoint, she is doing quite well.  Her recent upper GI shows the fistulas are healing and she has been started on p.o.'s.  Her elevated enzymes can certainly be attributed to her TNA. We will discuss this with Dr. Ezzard Standing with further evaluation and treatment as indicated.  [Difficult patient who actually is slowly  improving.]   Eber Hong, P.A.  Sandria Bales. Ezzard Standing, M.D., FACS    WDJ/MEDQ  D:  01/22/2011  T:  01/22/2011  Job:  [id number]  cc:   Mariea Stable, MD Bevelyn Buckles. Bensimhon, MD  Electronically Signed by Sherrie George P.A. on 02/07/2011 10:31:45 AM Electronically Signed by Ovidio Kin M.D. on 02/19/2011 10:42:33 AM

## 2011-02-20 LAB — CBC
HCT: 29 % — ABNORMAL LOW (ref 36.0–46.0)
Hemoglobin: 9.7 g/dL — ABNORMAL LOW (ref 12.0–15.0)
MCV: 83.6 fL (ref 78.0–100.0)
Platelets: 189 10*3/uL (ref 150–400)
RBC: 3.37 MIL/uL — ABNORMAL LOW (ref 3.87–5.11)
RDW: 13.4 % (ref 11.5–15.5)
RDW: 13.2 % (ref 11.5–15.5)
WBC: 4.4 10*3/uL (ref 4.0–10.5)
WBC: 6.1 10*3/uL (ref 4.0–10.5)

## 2011-02-20 LAB — COMPREHENSIVE METABOLIC PANEL
Alkaline Phosphatase: 319 U/L — ABNORMAL HIGH (ref 39–117)
BUN: 5 mg/dL — ABNORMAL LOW (ref 6–23)
CO2: 18 mEq/L — ABNORMAL LOW (ref 19–32)
Chloride: 115 mEq/L — ABNORMAL HIGH (ref 96–112)
GFR calc non Af Amer: 40 mL/min — ABNORMAL LOW (ref >60)
Glucose, Bld: 93 mg/dL (ref 70–99)
Potassium: 3.4 mEq/L — ABNORMAL LOW (ref 3.5–5.1)
Total Bilirubin: 1 mg/dL (ref 0.3–1.2)
Total Protein: 5.7 g/dL — ABNORMAL LOW (ref 6.0–8.3)

## 2011-02-22 LAB — VITAMIN D 1,25 DIHYDROXY
Vitamin D 1, 25 (OH)2 Total: <8 pg/mL — ABNORMAL LOW (ref 18–72)
Vitamin D2 1, 25 (OH)2: <8 pg/mL
Vitamin D3 1, 25 (OH)2: <8 pg/mL

## 2011-02-25 NOTE — Discharge Summary (Signed)
[name], [name]                 ACCOUNT NO.:  [id number]  MEDICAL RECORD NO.:  [id number]           PATIENT TYPE:  I  LOCATION:  3019                         FACILITY:  [facility]  PHYSICIAN:  Clydia Llano, MD       DATE OF BIRTH:  1941/09/30  DATE OF ADMISSION:  01/21/2011 DATE OF DISCHARGE:  01/30/2011                              DISCHARGE SUMMARY   PRIMARY CARE PHYSICIAN:  [facility].  SURGEON:  Ollen Gross. Vernell Morgans, MD  CARDIOLOGIST:  Bevelyn Buckles. Bensimhon, MD  GASTROENTEROLOGIST:  Rachael Fee, MD  REASON FOR ADMISSION:  Nausea and vomiting, episodes of hematemesis.  DISCHARGE DIAGNOSES: 1. Nausea, vomiting, resolved. 2. History of TPN discontinued during this hospitalization.  The     patient now is taking p.o. 3. History of duodenal cutaneous fistula. 4. T tube in place. 5. Ileostomy with partial colectomy, ileostomy bag after partial     colectomy of the partial right colectomy. 6. Protein-calorie malnutrition. 7. Asthma. 8. Hypertension. 9. Paroxysmal atrial fibrillation. 10.Coronary artery disease. 11.Non-ST elevation myocardial infarction. 12.Mild transaminitis.  RADIOLOGY: 1. CT abdomen, pelvis March 14 showed no acute abnormalities. 2. Abdominal x-ray showed no active disease.  Chest x-ray showed no     active process in the lungs.  PROCEDURES:  EGD done March 19 by Dr. Rob Bunting showed small hiatal hernia, previously placed percutaneous biliary drain extending into the duodenum.  Otherwise normal examination.  BRIEF HISTORY AND EXAMINATION:  [name] is a 70 year old [place] woman.  The patient has complex medical problems during the past 6 months.  The patient started with non-ST-segment elevation MI when she came into the hospital.  Then the patient did have evidence of choledocholithiasis.  The patient underwent ERCP and a sphincterectomy. She ended up with duodenal perforation and then she had repair for that with open cholecystectomy,  common bile duct exploration, and placement of T like intolerance T-tube.  Then after that the patient developed right colon gangrene.  The patient undergone right colectomy with ileostomy tube in place.  Meanwhile the patient complicated paroxysmal atrial fibrillation.  She had to be on amiodarone.  She was not on Coumadin.  The patient was sent to nursing home afterwards.  The patient was sent for nausea and vomiting.  The patient does develop some nausea associated with vomiting, noted small amount of blood present.  At this point, a skilled nursing facility transferred the patient to emergency department.  She had no further episode of nausea, vomiting since then.  The patient continued to have burning sensation in the epigastric area that radiates into the chest periodically, although has not had any further episode of emesis. The patient reports that she did not have symptoms like before but the patient admitted for further medical evaluation.  DISCHARGE MEDICATIONS: 1. Aspirin 325 mg p.o. daily. 2. Reglan 10 mg p.o. daily at bedtime. 3. Oxycodone/acetaminophen 5/325 mg p.o. 1-2 tablets every 8 hours as     needed for pain. 4. Protonix 40 mg p.o. b.i.d. 5. Phenergan 12.5 mg daily at bedtime. 6. Lorazepam 1 mg daily as needed for anxiety/nausea. 7.  Metoprolol tartrate 12.5 mg p.o. b.i.d. 8. Zofran 4 mg p.o. 3 times daily. 9. Albuterol 90 mcg inhaler 2 puffs every 4 hours as needed for     shortness of breath. 10.Flovent 110 mcg inhaled b.i.d. 11.Nitroglycerin 0.4 tablets sublingual under the tongue every 5     minutes as needed up to 3 doses for chest pain.  BRIEF HOSPITAL STAY: 1. Nausea and vomiting.  The nausea is multifactorial probably because     the patient was not feeding.  The patient was on TPN for prolonged     time.  The patient did have duodenal cutaneous fistula and she was     discharged to the nursing home on percutaneous biliary drain     between 2 packets  T-tube.  The patient also on amiodarone which can     cause nausea.  During this hospitalization, amiodarone was     discontinued by Dr. Gala Romney.  The total parenteral nutrition was     discontinued also.  The patient was started on oral diet and she     has been having some nausea but never vomited.  Dr. Christella Hartigan from     gastroenterology recommend to take scheduled nausea medication     until the patient can tolerate food.  Also recommended small     frequent meals..  The patient can go home and she will follow up     with [facility] for the T-tube removal time. 2. Acute kidney injury.  The patient has elevated BUN and creatinine     to 1.5.  The patient's baseline is about 0.9.  Aggressive     hydration.  The patient's creatinine goes back to 1.3-1.2.  On the     day of discharge, the patient encouraged to keep herself hydrated     as well as small frequent meals. 3. Paroxysmal atrial fibrillation, now the patient in normal sinus     rhythm.  The patient was on amiodarone.  Dr. Gala Romney recommended     to discontinue the amiodarone and continue metoprolol as well as     full dose of aspirin. 4. Increased LFTs likely secondary to amiodarone as well as TPN via T-     tube also might contribute to that.  The patient needs followup     probably after the T-tube removal on that. 5. Nutrition.  Appetite has started to pick up.  The patient was     started to eat small frequent meals.  The patient did not use her     stomach for the past 3 months since she was on TPN.  So early     satiety will be very expected.  The patient placed on b.i.d. PPI,     Zofran, Reglan, and promethazine and when she sees     gastroenterology, she might be weaned off this antiemetics. 6. Other chronic medical problems, coronary artery disease is stable. 7. Disposition home with home health service.  DISCHARGE INSTRUCTIONS: 1. Disposition home with home health service. 2. Diet regular, instructed  to eat small frequent meals. 3. Activity as tolerated.  [place] interpreter used in all of the interviews and as well as the discharge instructions.     Clydia Llano, MD     ME/MEDQ  D:  01/30/2011  T:  01/31/2011  Job:  [id number]  cc:   Girtha Rm. Vernell Morgans, M.D. Rachael Fee, MD  Electronically Signed by Clydia Llano  on 02/25/2011 03:36:42 PM

## 2011-03-02 NOTE — Consult Note (Signed)
NAME:  [name], [name]                ACCOUNT NO.:  [id number]  MEDICAL RECORD NO.:  [id number]           PATIENT TYPE:  LOCATION:                                 FACILITY:  PHYSICIAN:  Adolph Pollack, M.D.DATE OF BIRTH:  1941/02/08  DATE OF CONSULTATION: DATE OF DISCHARGE:                                CONSULTATION   CHIEF COMPLAINT:  Three days of abdominal pain, nausea, decreased appetite, and weakness in the right hand.  BRIEF HISTORY:  The patient is a 70 year old [place] woman who was seen here back in November 2011 with choledocholithiasis.  She underwent ERCP and sphincterotomy with subsequent bleeding and perforated duodenum. She underwent exploratory surgery to control the bleeding, T-tube placed, and repaired the duodenum.  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  She was hospitalized for prolonged period up until December 23, 2010, on TNA, local wound care.  Patient was NPO due to the duodenal-cutaneous fistulas, which she never really understood.  She was then transferred to a nursing facility where they could continue her TNA.  She was readmitted again on January 22, 2011.  Dr. Carolynne Edouard had seen her and the patient had an upper GI on January 12, 2011 and was started on liquids and came in with hemoptysis after eating soup and some apple.  She was eventually discharged to home on January 30, 2011.  During that evaluation, she underwent EGD which showed no gastritis.  GI problems might be secondary to amiodarone.  Cardiology was consulted.  Amiodarone was discontinued.  The EGD only showed a small hiatal hernia, otherwise it was perfectly normal.  After discharge, the patient apparently had been doing well but comes in now.  Her complaint in the ER was 3 days of abdominal pain, nausea, decreased appetite.  On talking to her with  the interpreter,(over the phone) it sounds like it has been longer than that, but again it is very difficult to tell with this lady.  She speaks no [place].  Again she has never really understood her problem.  She presents with nausea and vomiting and her T-tube in a garbage bag which is leaking all over.  Her last visit was February 05, 2011 in our office.  She was seen by Dr. Carolynne Edouard and scheduled for injection of the T-tube to look for a leak and hopes that if it is normal, we could cap the T-tube.  Now CT in the ER shows that the T-tube is still in place but is moved from the fourth portion of the duodenum to the second segment of the duodenum.  There was no bile leak or dilatation of the bile ducts to suggest obstruction.  There is some perinephric stranding on the right but no hydronephrosis.  Labs shows she has new acute renal insufficiency with creatinine of 3.61, BUN of 50, glucose is 99.  Sodium is 129, potassium is 5.4, chloride is 108, CO2 is 15.  White count is 8.6, hemoglobin 12.9, hematocrit 37, platelets 245,000.  Lipase 54.  Lactic acid 0.7.  UA showed positive for bilirubin and protein, 3-6 white cells, total bilirubin is 1.2, alk phos 443, SGOT and PT are 87 and 97 respectively.  CK-MB is 1.9.  Troponin is less than 0.05.  Myoglobin is 136.  Labs on January 26, 2011 showed her sodium was 127, potassium 4.3, chloride 102, CO2 is 19, BUN is 58, creatinine 1.31, total bilirubin was 0.7, alk phos 208, SGOT 95, SGPT 107.  The patient presents with the above complaints and her T tube bag which was a reason we were asked to see her, was in a garbage back leaking all over.  PAST MEDICAL HISTORY: 1. Status post choledocholithiasis, ERCP, sphincterotomy with     subsequent bleeding, perforation of the duodenum.  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 2. History non-ST elevation  MI August 2011. 3. History of atrial fibrillation. 4. Hypertension. 5. Asthma. 6. Protein malnutrition, previously on TPN, now off.  SURGERIES: 1. Duodenal perforation, exploration laparotomy, lysis of adhesions,     repair of perforation, open cholecystectomy, common bile duct     exploration and placement of T-tube. 2. Status post colectomy and lysis of adhesions, ileostomy secondary     to cutaneous fistula in right and necrotic right colon. 3. Successful exchange of common bile duct drain on December 10, 2010     in interventional radiology. 4. CT of the abdomen and pelvis, January 21, 2011 shows external drain     catheter extends from right flank and the common bile duct through     the ampule into the distal duodenum.  There was no dilated     intrahepatic ducts, diminished air in the biliary tree.  Liver     parenchyma was normal.  Spleen and pancreas and adrenals were also     normal.  FAMILY HISTORY:  Noncontributory.  SOCIAL HISTORY:  Note tobacco, alcohol, or drugs.  REVIEW OF SYSTEMS:  The patient's only complaint is of nausea, unable to eat for a month.  The leaking bag and weakness in the right arm. Remainder of the review of systems was negative.  Medications on discharge January 30, 2011 include aspirin 325 mg daily, Reglan 10 mg at bedtime, oxycodone/acetaminophen 5/325 p.r.n., Protonix 40 mg daily, Phenergan 12.5 mg at bedtime, lorazepam 1 mg daily, metoprolol tartrate 12.5 mg b.i.d., Zofran 4 mg t.i.d., albuterol 90 mcg 2 puffs q.4 h., Flovent inhaler 110 mcg b.i.d., nitroglycerin 0.4 mg sublingual under the tongue q.5 p.r.n.  PHYSICAL EXAMINATION:  VITAL SIGNS:  The first set of vital signs from 20:30 hours last night shows temperature 97.5, a heart rate of 88, blood pressure 104/85, respiratory rate of 18, sats are 97% on room air.  Last set of vital signs in the ER, temperature is 98, heart rate is 78, blood pressure 97/60, respiratory rate 18, sats  96%. GENERAL:  This a well-developed [place] female sitting on the bed. Nurses are attempting to put her dressing gown on but she is currently wearing it as a turban. HEAD:  Normocephalic. EYES, EARS, NOSE AND THROAT:  Grossly within normal limits. NECK:  No bruits.  No JVD.  Trachea is in the midline. CHEST:  Clear but she is wheezing bilaterally. CARDIAC:  Normal S1, S2.  Pulses are +2 and equal in upper and lower extremities. CHEST:  Nontender. ABDOMEN:  Nontender, nondistended.  Positive bowel sounds.  There is minimal drainage around the T tube site, minimal drainage from the old colostomy site.  The ileostomy has a bag in place and is functioning normally.  No masses, hernia, or abscess noted. GU/RECTAL:  Deferred. MUSCULOSKELETAL:  Grossly within normal limits. SKIN:  No changes. NEUROLOGIC:  She is talking using both arms easily with no discernible weakness or lack of coordination using a cup in her right upper extremity. PSYCH:  Normal affect.  LABORATORY DATA:  White count is 8.6, hemoglobin 12.9, hematocrit 37, platelets 245,000, lipase 54.  Sodium 154, potassium 5.4, chloride 108, CO2 is 15, BUN is 50, creatinine is 3.61, glucose is 99, total bilirubin 1.2, alk phos 443, SGOT, PT 87 and 97 respectively.  Lactic acid 0.7. UA showed 3-6 white cells, total bilirubin and protein were positive. CK-MB and troponin negative.  Abdominal flat plate shows normal bowel pattern, no air.  Some retraction biliary drain but it overlies second segment of duodenum.  CT scan shows question of pneumonia.  T-tube is in the second segment as opposed to the fourth during the last study January 21, 2011.  There was no bile leak or dilatation of the ducts to suggest obstruction.  There was perinephric stranding on the right with no hydronephrosis.  IMPRESSION: 1. Nausea and vomiting with acute renal insufficiency reported to be     of approximately 1 month duration. 2. History of duodenal  perforation with duodenocutaneous fistula     requiring right colectomy, lysis of adhesions, ileostomy, and long     T-tube placement.  T-tube has subsequently been dislodged but is     still in the second portion of the duodenum with no leak or biliary     obstruction seen. 3. History of myocardial infarction and atrial fibrillation previously     on metoprolol and amiodarone, currently just on metoprolol. 4. Asthma. 5. Hypertension.  PLAN:  T-tube bag has actually been replaced with a Foley bag.  We will ask the nurse to replace it with proper T-tube bag.  Dr. Carolynne Edouard has seen the patient and actually was trying to get her scheduled for T-tube injections to see if we have a leak or if we can actually clamp off the T-tube and we will do that while she is in the hospital.  Further workup and evaluation as needed.     Eber Hong, P.A.   ______________________________ Adolph Pollack, M.D.    WDJ/MEDQ  D:  02/17/2011  T:  02/17/2011  Job:  [id number]  cc:   Bevelyn Buckles. Bensimhon, MD Rachael Fee, MD  Electronically Signed by Sherrie George P.A. on 02/18/2011 06:45:57 PM Electronically Signed by Avel Peace M.D. on 03/02/2011 02:12:33 PM

## 2011-03-02 NOTE — Discharge Summary (Signed)
Regina, Jenkins NO.:  [id number]  MEDICAL RECORD NO.:  [id number]           PATIENT TYPE:  I  LOCATION:  6735                         FACILITY:  [facility]  PHYSICIAN:  Isidor Holts, M.D.  DATE OF BIRTH:  1941/03/13  DATE OF ADMISSION:  02/16/2011 DATE OF DISCHARGE:  02/20/2011                              DISCHARGE SUMMARY   PRIMARY MD:  [facility].  PRIMARY SURGEON:  Ollen Gross. Vernell Morgans, MD.  DISCHARGE DIAGNOSES: 1. History of bronchial asthma. 2. Hypertension. 3. Paroxysmal atrial fibrillation. 4. Coronary artery disease status post previous NSTEMI. 5. History of perforated duodenum status post repair complicated by     duodeno-cutaneous fistula. 6. Status post open cholecystectomy with T-tube placement for     cholelithiasis. 7. Status post right partial colectomy/ileostomy 8. Dehydration and volume depletion and hyponatremia. 9. Acute renal the failure on chronic kidney disease. 10.Failure to thrive secondary to nausea. 11.Arthritis.  DISCHARGE MEDICATIONS: 1. Nutritional supplement (Resource Breeze liquid) 240 mL p.o. t.i.d. 2. Ultram 25 mg p.o. t.i.d., a total of 45 pills have been dispensed     for pain. 3. Albuterol inhaler (90 mcg) 2 puffs p.r.n. q.4 h. for shortness of     breath. 4. Aspirin 325 mg enteric-coated one p.o. daily. 5. Flovent HFA inhaler (110 mcg) 1 puff b.i.d. 6. Lorazepam 1 mg p.o. p.r.n. daily for anxiety. 7. Metoprolol tartrate 12.5 mg p.o. b.i.d. 8. Metoclopramide 10 mg p.o. t.i.d. before food (was on 10 mg p.o. at     bedtime). 9. Nitroglycerin 0.4 mg one tablet sublingually p.r.n. every 5 minutes     for 3 doses for chest pain. 10.Ondansetron 4 mg tablets 1 p.o. t.i.d. 11.Oxycodone/APAP (5/325) 1-2 tablets p.o. p.r.n. q.8 h. for pain. 12.Protonix 40 mg p.o. b.i.d. 13.Phenergan 12.5 mg p.o. p.r.n. at bedtime.  PROCEDURES: 1. Abdominal/chest x-ray February 17, 2011.  This showed unremarkable     bowel gas pattern.   No free intra-abdominal air seen, right-sided     biliary tree, which has retracted since prior study, post overlies     the second segment of the duodenum.  No acute cardiopulmonary     process identified. 2. Abdominal/pelvic CT scan February 17, 2011.  This showed interval     retraction of T-type biliary drain ending at the second segment of     the duodenum with distal side ports opposed to the proximal edge of     the common hepatic duct.  No evidence of bile leak or intrahepatic     biliary ductal dilatation to suggest obstruction due to     retractions.  The appearance suggested debilitating continues to     work normally.  Otherwise, no significant change from prior study.     Postoperative changes within the abdomen, decompressed residual     colon, and left lower quadrant ileostomy are stable in appearance.     No evidence of bowel obstruction.  Tiny noted densities scattered     throughout the visualized portions of the right lung.  New from     prior study. 3. Cholangiogram February 18, 2011.  This showed prior drainage catheter     in place distal portion and tip of the drainage within the third     portion of the duodenum.  No obstruction or choledocholithiasis.     Contrast by history of biliary drainage in the tract of the     duodenum.  No leakage of contrast outside the tract of the duodenum     seen.  CONSULTATIONS:  Dr. Avel Peace, surgeon.  ADMISSION HISTORY:  As in H&P notes of February 17, 2011, dictated by Dr. Donnalee Curry. However, in brief this is a 70 year old [place] female, with known history of hypertension, paroxysmal atrial fibrillation, coronary artery disease status post previous NSTEMI bronchial asthma diagnosed with choledocholithiasis in November 2011, subsequently underwent ERCP with sphincterectomy and subsequent bleeding and perforated duodenum.  She underwent exploratory surgery to control bleeding.  T-tube was placed in the biliary tract and  cutaneous fistula repaired.  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  she was thereafter hospitalized up until December 23, 2010, and then transferred to nursing facility.  The patient was subsequently rehospitalized from January 21, 2011, to January 30, 2011, for nausea and vomiting presumed secondary to gastritis.  She also apparently had problems related to amiodarone, which was subsequently discontinued and was discharged in satisfactory condition, but now represents on February 17, 2011, with persistent nausea, decreased p.o. intake, and complaints of leakage of T- tube.  She was then admitted for further evaluation, investigation, and management.  CLINICAL COURSE: 1. Dehydration and acute renal failure.  The patient presents as     described above.  Initial laboratory findings showed a BUN of 50,     creatinine 3.6 against a known baseline creatinine of 1.31 to 1.38.     Sodium was also low at 129.  The patient was managed with     intravenous fluids with satisfactory clinical improvement, and we     are pleased to know that on February 20, 2011, BUN had normalized at     5, creatinine was 1.32, i.e., at baseline.  Intravenous fluids have     been discontinued.  2. Nausea and vomiting.  This was felt secondary to metabolic     abnormalities, although may indeed have been the precipitant for     above.  However, over the course of the patient's hospitalization     with resolution of dehydration and acute renal failure, the     patient's nausea and vomiting resolved, and  as of February 19, 2011, she     was maintaining oral intake without any problems, whatsoever.  3. Hypertension.  This was readily controlled on preadmission     medications.  4. History of paroxysmal atrial fibrillation.  The patient remained in     sinus rhythm throughout  the course of this hospitalization.  5. Coronary artery disease.  There are no problems referable to this.  6. History of open cholecystectomy/T-tube placement.  The patient at     the time of presentation, complained of leakage of the T-tube.  This     necessitated calling a surgical consultation, which was kindly     provided by Dr. Avel Peace, who evaluated the patient in     detail, and arranged abdominal imaging studies, which showed no     evidence of leaking.  The patient has been reassured accordingly.  Throughout the rest of course of the hospitalization, no leakage of     T-tube was documented.  7. 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  The patient's ostomy was seen by the wound care     and ostomy team and appears to be functioning normally.  8. Arthritic pains.  During the course of her hospitalization, the     patient did complain of pains in the hands, elbows, and shoulders     bilaterally which she informs me has been going on for     approximately 1 month now.  She has been placed on analgesics for     this.  DISPOSITION:  The patient was on February 20, 2011, considered clinically stable for discharge.  There were no new issues.  She was therefore discharged accordingly.  ACTIVITY:  As tolerated.  Recommended to increase activity slowly.  DIET:  Heart-healthy.  FOLLOWUP INSTRUCTIONS:  The patient is to follow up with her primary MD at [facility] routinely, per prior scheduled appointment.  In addition, she is to follow up with her surgeon, Dr. Carolynne Edouard or Dr. Abbey Chatters at [facility], within 2 weeks of discharge.  SPECIAL INSTRUCTIONS:  Home Health care has been arranged, including RN for stoma care as well as Child psychotherapist.     Isidor Holts, M.D.     CO/MEDQ  D:  02/20/2011  T:  02/21/2011  Job:  [id number]  cc:   [facility] Pollack, M.D. Ollen Gross. Vernell Morgans,  M.D.  Electronically Signed by Isidor Holts M.D. on 03/02/2011 03:01:07 PM

## 2011-03-05 ENCOUNTER — Other Ambulatory Visit (HOSPITAL_COMMUNITY): Payer: Self-pay | Admitting: General Surgery

## 2011-03-05 DIAGNOSIS — T85528A Displacement of other gastrointestinal prosthetic devices, implants and grafts, initial encounter: Secondary | ICD-10-CM

## 2011-03-06 ENCOUNTER — Other Ambulatory Visit (HOSPITAL_COMMUNITY): Payer: Self-pay | Admitting: General Surgery

## 2011-03-06 ENCOUNTER — Ambulatory Visit (HOSPITAL_COMMUNITY)
Admission: RE | Admit: 2011-03-06 | Discharge: 2011-03-06 | Disposition: A | Discharge status: Home / Self Care | Payer: Medicaid Other | Source: Ambulatory Visit | Attending: General Surgery | Admitting: General Surgery

## 2011-03-06 DIAGNOSIS — T85528A Displacement of other gastrointestinal prosthetic devices, implants and grafts, initial encounter: Secondary | ICD-10-CM

## 2011-03-06 DIAGNOSIS — Z438 Encounter for attention to other artificial openings: Secondary | ICD-10-CM | POA: Insufficient documentation

## 2011-04-16 ENCOUNTER — Inpatient Hospital Stay (INDEPENDENT_AMBULATORY_CARE_PROVIDER_SITE_OTHER)
Admission: RE | Admit: 2011-04-16 | Discharge: 2011-04-16 | Disposition: A | Discharge status: Home / Self Care | Payer: Medicaid Other | Source: Ambulatory Visit | Attending: Emergency Medicine | Admitting: Emergency Medicine

## 2011-04-16 DIAGNOSIS — M79609 Pain in unspecified limb: Secondary | ICD-10-CM

## 2011-04-16 LAB — SEDIMENTATION RATE: Sed Rate: 32 mm/hr — ABNORMAL HIGH (ref 0–22)

## 2011-04-16 NOTE — H&P (Signed)
[name], [name] NO.:  [id number]  MEDICAL RECORD NO.:  [id number]  LOCATION:                                 FACILITY:  PHYSICIAN:  Kela Millin, M.D.DATE OF BIRTH:  14-Nov-1940  DATE OF ADMISSION:  02/16/2011 DATE OF DISCHARGE:                             HISTORY & PHYSICAL   PRIMARY CARE PHYSICIAN:  [facility].  SURGEON:  Dr. Carolynne Edouard.  CHIEF COMPLAINT:  Persistent nausea, with decreased p.o. intake.  HISTORY OF PRESENT ILLNESS:  The patient is a 70 year old [place] with complex/extensive surgical history including status post exploratory laparotomy with duodenotomy and repair of a duodenal perforation and bleeding, open cholecystectomy, common bile duct exploration and placement of a T-tube, status post exploratory laparotomy with right colectomy and ileostomy, choledocholithiasis and status post ERCP with sphincterotomy in the past.  Other medical problems include bronchial asthma, hypertension, paroxysmal atrial fibrillation, coronary artery disease, status post non-ST elevation MI in the past, history of mild transaminitis secondary to TPN, history of duodenal cutaneous fistula and also protein calorie malnutrition requiring TPN that was discontinued during her last hospitalization in March and she presents with the above complaints.  She is a non-English-speaking and the history is obtained through an interpreter via phone.  She reports that since her discharge in March her p.o. intake has been minimal, but she was able to eat a couple of bites, this was because of the persistent nausea that she has been having although she denies any vomiting.  She states that in the past 3 days just because she appears having to vomit she has not had anything to eat at all.  Her family also states that the bag attached to the T-tube was leaking and so they came to the ED.  She admits to abdominal pain, but states that she has had it for a long time and it  has not changed and usually she just takes the medications that she was prescribed from the hospital for the pain, she is unable to describes the pain further.  She denies fevers, dysuria, also denies increased ileostomy output/diarrhea.  She was seen in the ED and lab work revealed a BUN of 50 with a creatinine of 3.61 and it was noted that her last creatinine on January 30, 2011 was 1.38 with a BUN of 23. An urinalysis was also done and came back with moderate leukocyte esterase and 3-6 wbc's.  Abdominal x-rays revealed an unremarkable bowel gas pattern, no free intra-abdominal air seen.  Right-sided biliary drain noted to have retracted since prior study, but still overlies the second segment of the duodenum.  No acute cardiopulmonary process identified.  CT scan of her abdomen and pelvis showed no acute abnormalities.  The patient denies chest pain, shortness of breath, melena and no hematochezia.  She is admitted for further evaluation and management.  PAST MEDICAL HISTORY: 1. As above. 2. History of asthma.  MEDICATIONS: 1. Metoprolol 12.5 mg p.o. b.i.d. 2. Phenergan 12.5 mg p.o. at bedtime. 3. Protonix 40 mg p.o. b.i.d. 4. Percocet 1-2 tablets q.8 h p.r.n. 5. Zofran 4 mg 1 tablet t.i.d. 6. Nitroglycerin sublingual 0.4 p.r.n. 7. Reglan 10 mg p.o.  at bedtime. 8. Lorazepam 1 mg p.o. daily p.r.n. 9. Flovent MDI b.i.d. 10.Aspirin 325 mg p.o. daily. 11.Albuterol MDI q.4 h p.r.n.  ALLERGIES:  NKDA.  SOCIAL HISTORY:  Denies tobacco, denies alcohol.  FAMILY HISTORY:  Noncontributory to current illness.  REVIEW OF SYSTEMS:  As per HPI, otherwise negative.  PHYSICAL EXAM:  GENERAL:  The patient is a thin appearing elderly female in no apparent distress. VITAL SIGNS:  Her temperature is 98.2 with a blood pressure of 104/64, pulse is 64 and O2 sat is 97%. HEENT:  PERRL, EOMI, dry mucous membranes, sclerae anicteric.  No oral exudates. NECK:  Supple, no adenopathy, no  thyromegaly and no JVD. LUNGS:  Clear to auscultation bilaterally.  No crackles or wheezes. CARDIOVASCULAR:  Regular rate and rhythm.  Normal S1-S2. ABDOMEN:  She has a T-drain from her right upper quadrant and a well- healed scar slightly to the left of her umbilicus and down midline, ileostomy bag to the left of her abdomen field with yellowish stool. Mild diffuse tenderness, no rebound, bowel sounds present. EXTREMITIES:  No cyanosis or edema. NEURO:  Cranial nerves II through XII are grossly intact, nonfocal exam.  LABORATORY DATA:  As per HPI and also her white cell count is 8.6 with a hemoglobin of 12.9, hematocrit of 37, platelet count is 245, neutrophil count 81%.  Sodium is 129 with a potassium of 5.4, 5.2 on recheck.  Her chloride is 108 with a CO2 of 15, glucose 99 and BUN is 50 with a creatinine of 3.61, calcium is 9.7, total protein is 8.2, albumin is 3.6.  Her lactic acid level is 0.7.  Point-of-care markers negative x1.  ASSESSMENT AND PLAN: 1. Volume depletion/hyponatremia - secondary to decreased p.o. intake     due to chronic nausea.  We will hydrate, follow and recheck. 2. Acute renal failure - most likely prerenal secondary to above,     hydrate, follow and recheck. 3. Probable urinary tract infection - may be contributing to nausea,     will obtain urine cultures and start empiric antibiotics. 4. Leak age of bag attached to T-tube - Surgery consult for further     recommendations. 5. History of coronary artery disease - Chest pain free, continue     outpatient medications. 6. History of hypertension - Continue outpatient medications. 7. Paroxysmal atrial fibrillation - Currently normal sinus rhythm.     Continue aspirin and beta blockers. 8. History of asthma - Stable, continue bronchodilators. 9. History of exploratory laparotomy with duodenectomy and repair of     duodenal perforation and bleeding, open cholecystectomy, common     bile duct exploration and a  placement of G-tube - per Surgery. 10.Status post exlap with right colectomy and ileostomy - per Surgery. 11.History of choledocholithiasis and status post ERCP with     sphincterotomy in the past. 12.History of protein calorie malnutrition requiring TPN -     discontinued during her last hospitalization.     Kela Millin, M.D.     ACV/MEDQ  D:  02/17/2011  T:  02/17/2011  Job:  [id number]  cc:   Dorna Leitz, M.D., F.A.C.S. Ollen Gross. Vernell Morgans, M.D.  Electronically Signed by Donnalee Curry M.D. on 04/16/2011 08:40:34 AM

## 2011-05-09 ENCOUNTER — Emergency Department (HOSPITAL_COMMUNITY): Payer: Medicaid Other

## 2011-05-09 ENCOUNTER — Emergency Department (HOSPITAL_COMMUNITY)
Admission: EM | Admit: 2011-05-09 | Discharge: 2011-05-09 | Disposition: A | Discharge status: Home / Self Care | Payer: Medicaid Other | Attending: Emergency Medicine | Admitting: Emergency Medicine

## 2011-05-09 ENCOUNTER — Inpatient Hospital Stay (INDEPENDENT_AMBULATORY_CARE_PROVIDER_SITE_OTHER)
Admission: RE | Admit: 2011-05-09 | Discharge: 2011-05-09 | Disposition: A | Discharge status: Home / Self Care | Payer: Medicaid Other | Source: Ambulatory Visit | Attending: Family Medicine | Admitting: Family Medicine

## 2011-05-09 DIAGNOSIS — I252 Old myocardial infarction: Secondary | ICD-10-CM | POA: Insufficient documentation

## 2011-05-09 DIAGNOSIS — R630 Anorexia: Secondary | ICD-10-CM | POA: Insufficient documentation

## 2011-05-09 DIAGNOSIS — R112 Nausea with vomiting, unspecified: Secondary | ICD-10-CM | POA: Insufficient documentation

## 2011-05-09 DIAGNOSIS — I10 Essential (primary) hypertension: Secondary | ICD-10-CM | POA: Insufficient documentation

## 2011-05-09 DIAGNOSIS — Z760 Encounter for issue of repeat prescription: Secondary | ICD-10-CM | POA: Insufficient documentation

## 2011-05-09 DIAGNOSIS — R0989 Other specified symptoms and signs involving the circulatory and respiratory systems: Secondary | ICD-10-CM | POA: Insufficient documentation

## 2011-05-09 DIAGNOSIS — K219 Gastro-esophageal reflux disease without esophagitis: Secondary | ICD-10-CM | POA: Insufficient documentation

## 2011-05-09 DIAGNOSIS — Z79899 Other long term (current) drug therapy: Secondary | ICD-10-CM | POA: Insufficient documentation

## 2011-05-09 DIAGNOSIS — M2550 Pain in unspecified joint: Secondary | ICD-10-CM | POA: Insufficient documentation

## 2011-05-09 DIAGNOSIS — I4891 Unspecified atrial fibrillation: Secondary | ICD-10-CM | POA: Insufficient documentation

## 2011-05-09 DIAGNOSIS — J45909 Unspecified asthma, uncomplicated: Secondary | ICD-10-CM | POA: Insufficient documentation

## 2011-05-09 DIAGNOSIS — R109 Unspecified abdominal pain: Secondary | ICD-10-CM

## 2011-05-09 DIAGNOSIS — R0609 Other forms of dyspnea: Secondary | ICD-10-CM | POA: Insufficient documentation

## 2011-05-09 DIAGNOSIS — R079 Chest pain, unspecified: Secondary | ICD-10-CM | POA: Insufficient documentation

## 2011-05-09 DIAGNOSIS — E785 Hyperlipidemia, unspecified: Secondary | ICD-10-CM | POA: Insufficient documentation

## 2011-05-09 LAB — CBC
MCHC: 34.4 g/dL (ref 30.0–36.0)
Platelets: 189 10*3/uL (ref 150–400)
RDW: 13.4 % (ref 11.5–15.5)
WBC: 13.9 10*3/uL — ABNORMAL HIGH (ref 4.0–10.5)

## 2011-05-09 LAB — URINE MICROSCOPIC-ADD ON

## 2011-05-09 LAB — URINALYSIS, ROUTINE W REFLEX MICROSCOPIC
Hgb urine dipstick: NEGATIVE
Protein, ur: NEGATIVE mg/dL
Specific Gravity, Urine: 1.02 (ref 1.005–1.030)
Urobilinogen, UA: 0.2 mg/dL (ref 0.0–1.0)

## 2011-05-09 LAB — COMPREHENSIVE METABOLIC PANEL
ALT: 35 U/L (ref 0–35)
Albumin: 3.7 g/dL (ref 3.5–5.2)
Alkaline Phosphatase: 132 U/L — ABNORMAL HIGH (ref 39–117)
BUN: 22 mg/dL (ref 6–23)
Calcium: 9.6 mg/dL (ref 8.4–10.5)
GFR calc Af Amer: 50 mL/min — ABNORMAL LOW (ref >60)
Glucose, Bld: 122 mg/dL — ABNORMAL HIGH (ref 70–99)
Potassium: 4.3 mEq/L (ref 3.5–5.1)
Sodium: 136 mEq/L (ref 135–145)
Total Protein: 7.7 g/dL (ref 6.0–8.3)

## 2011-05-09 LAB — DIFFERENTIAL
Basophils Absolute: 0 10*3/uL (ref 0.0–0.1)
Eosinophils Absolute: 0.1 10*3/uL (ref 0.0–0.7)
Eosinophils Relative: 1 % (ref 0–5)
Monocytes Absolute: 0.5 10*3/uL (ref 0.1–1.0)

## 2011-05-09 LAB — LIPASE, BLOOD: Lipase: 15 U/L (ref 11–59)

## 2011-05-12 LAB — URINE CULTURE

## 2011-05-22 ENCOUNTER — Emergency Department (HOSPITAL_COMMUNITY): Payer: Medicaid Other

## 2011-05-22 ENCOUNTER — Emergency Department (HOSPITAL_COMMUNITY)
Admission: EM | Admit: 2011-05-22 | Discharge: 2011-05-23 | Disposition: A | Discharge status: Home / Self Care | Payer: Medicaid Other | Attending: Emergency Medicine | Admitting: Emergency Medicine

## 2011-05-22 DIAGNOSIS — J45909 Unspecified asthma, uncomplicated: Secondary | ICD-10-CM | POA: Insufficient documentation

## 2011-05-22 DIAGNOSIS — I252 Old myocardial infarction: Secondary | ICD-10-CM | POA: Insufficient documentation

## 2011-05-22 DIAGNOSIS — Y849 Medical procedure, unspecified as the cause of abnormal reaction of the patient, or of later complication, without mention of misadventure at the time of the procedure: Secondary | ICD-10-CM | POA: Insufficient documentation

## 2011-05-22 DIAGNOSIS — I10 Essential (primary) hypertension: Secondary | ICD-10-CM | POA: Insufficient documentation

## 2011-05-22 DIAGNOSIS — IMO0002 Reserved for concepts with insufficient information to code with codable children: Secondary | ICD-10-CM | POA: Insufficient documentation

## 2011-05-22 DIAGNOSIS — I4891 Unspecified atrial fibrillation: Secondary | ICD-10-CM | POA: Insufficient documentation

## 2011-05-22 DIAGNOSIS — E785 Hyperlipidemia, unspecified: Secondary | ICD-10-CM | POA: Insufficient documentation

## 2011-05-22 LAB — CBC
HCT: 34.9 % — ABNORMAL LOW (ref 36.0–46.0)
Hemoglobin: 12.1 g/dL (ref 12.0–15.0)
MCHC: 34.7 g/dL (ref 30.0–36.0)
RBC: 4.26 MIL/uL (ref 3.87–5.11)
WBC: 9.2 10*3/uL (ref 4.0–10.5)

## 2011-05-22 LAB — COMPREHENSIVE METABOLIC PANEL
ALT: 34 U/L (ref 0–35)
Alkaline Phosphatase: 117 U/L (ref 39–117)
Chloride: 103 mEq/L (ref 96–112)
GFR calc Af Amer: >60 mL/min (ref >60)
Glucose, Bld: 104 mg/dL — ABNORMAL HIGH (ref 70–99)
Potassium: 4.3 mEq/L (ref 3.5–5.1)
Sodium: 133 mEq/L — ABNORMAL LOW (ref 135–145)
Total Bilirubin: 0.4 mg/dL (ref 0.3–1.2)
Total Protein: 7.9 g/dL (ref 6.0–8.3)

## 2011-05-22 LAB — DIFFERENTIAL
Basophils Absolute: 0.1 10*3/uL (ref 0.0–0.1)
Basophils Relative: 1 % (ref 0–1)
Lymphocytes Relative: 35 % (ref 12–46)
Monocytes Absolute: 0.5 10*3/uL (ref 0.1–1.0)
Neutro Abs: 5.2 10*3/uL (ref 1.7–7.7)
Neutrophils Relative %: 57 % (ref 43–77)

## 2011-05-23 MED ORDER — IOHEXOL 300 MG/ML  SOLN
100.0000 mL | Freq: Once | INTRAMUSCULAR | Status: AC | PRN
  Administered 2011-05-23: 100 mL via INTRAVENOUS

## 2011-06-08 ENCOUNTER — Emergency Department (HOSPITAL_COMMUNITY): Payer: Medicaid Other

## 2011-06-08 ENCOUNTER — Emergency Department (HOSPITAL_COMMUNITY)
Admission: EM | Admit: 2011-06-08 | Discharge: 2011-06-08 | Disposition: A | Discharge status: Home / Self Care | Payer: Medicaid Other | Attending: Emergency Medicine | Admitting: Emergency Medicine

## 2011-06-08 DIAGNOSIS — R1031 Right lower quadrant pain: Secondary | ICD-10-CM | POA: Insufficient documentation

## 2011-06-08 DIAGNOSIS — I10 Essential (primary) hypertension: Secondary | ICD-10-CM | POA: Insufficient documentation

## 2011-06-08 DIAGNOSIS — I252 Old myocardial infarction: Secondary | ICD-10-CM | POA: Insufficient documentation

## 2011-06-08 DIAGNOSIS — M79609 Pain in unspecified limb: Secondary | ICD-10-CM | POA: Insufficient documentation

## 2011-06-08 DIAGNOSIS — I4891 Unspecified atrial fibrillation: Secondary | ICD-10-CM | POA: Insufficient documentation

## 2011-06-08 DIAGNOSIS — E785 Hyperlipidemia, unspecified: Secondary | ICD-10-CM | POA: Insufficient documentation

## 2011-06-08 LAB — CBC
Hemoglobin: 12.5 g/dL (ref 12.0–15.0)
RBC: 4.41 MIL/uL (ref 3.87–5.11)
WBC: 12.8 10*3/uL — ABNORMAL HIGH (ref 4.0–10.5)

## 2011-06-08 LAB — POCT I-STAT, CHEM 8
Hemoglobin: 13.3 g/dL (ref 12.0–15.0)
Potassium: 4.2 mEq/L (ref 3.5–5.1)
Sodium: 139 mEq/L (ref 135–145)
TCO2: 20 mmol/L (ref 0–100)

## 2011-06-08 LAB — URINALYSIS, ROUTINE W REFLEX MICROSCOPIC
Bilirubin Urine: NEGATIVE
Glucose, UA: NEGATIVE mg/dL
Ketones, ur: NEGATIVE mg/dL
Nitrite: NEGATIVE
Protein, ur: NEGATIVE mg/dL
pH: 5 (ref 5.0–8.0)

## 2011-06-08 LAB — DIFFERENTIAL
Basophils Absolute: 0 10*3/uL (ref 0.0–0.1)
Basophils Relative: 0 % (ref 0–1)
Neutro Abs: 9.7 10*3/uL — ABNORMAL HIGH (ref 1.7–7.7)
Neutrophils Relative %: 76 % (ref 43–77)

## 2011-06-08 LAB — URINE MICROSCOPIC-ADD ON

## 2011-06-08 LAB — TROPONIN I: Troponin I: <0.3 ng/mL (ref <0.30)

## 2011-06-08 LAB — LIPASE, BLOOD: Lipase: 30 U/L (ref 11–59)

## 2011-07-09 ENCOUNTER — Emergency Department (HOSPITAL_COMMUNITY)
Admission: EM | Admit: 2011-07-09 | Discharge: 2011-07-09 | Disposition: A | Discharge status: Home / Self Care | Payer: Medicaid Other | Attending: Emergency Medicine | Admitting: Emergency Medicine

## 2011-07-09 DIAGNOSIS — I4891 Unspecified atrial fibrillation: Secondary | ICD-10-CM | POA: Insufficient documentation

## 2011-07-09 DIAGNOSIS — E785 Hyperlipidemia, unspecified: Secondary | ICD-10-CM | POA: Insufficient documentation

## 2011-07-09 DIAGNOSIS — I252 Old myocardial infarction: Secondary | ICD-10-CM | POA: Insufficient documentation

## 2011-07-09 DIAGNOSIS — I10 Essential (primary) hypertension: Secondary | ICD-10-CM | POA: Insufficient documentation

## 2011-07-09 DIAGNOSIS — IMO0001 Reserved for inherently not codable concepts without codable children: Secondary | ICD-10-CM | POA: Insufficient documentation

## 2011-07-09 DIAGNOSIS — M79609 Pain in unspecified limb: Secondary | ICD-10-CM | POA: Insufficient documentation

## 2011-07-09 DIAGNOSIS — Z79899 Other long term (current) drug therapy: Secondary | ICD-10-CM | POA: Insufficient documentation

## 2011-07-27 ENCOUNTER — Encounter (INDEPENDENT_AMBULATORY_CARE_PROVIDER_SITE_OTHER): Payer: Self-pay | Admitting: Surgery

## 2011-08-25 ENCOUNTER — Encounter (INDEPENDENT_AMBULATORY_CARE_PROVIDER_SITE_OTHER): Payer: Self-pay | Admitting: General Surgery

## 2011-08-25 ENCOUNTER — Ambulatory Visit (INDEPENDENT_AMBULATORY_CARE_PROVIDER_SITE_OTHER): Payer: Medicaid Other | Admitting: General Surgery

## 2011-08-25 VITALS — BP 118/86 | HR 88 | Temp 97.3°F | Resp 16 | Ht <= 58 in | Wt 87.2 lb

## 2011-08-25 DIAGNOSIS — Z932 Ileostomy status: Secondary | ICD-10-CM

## 2011-08-25 NOTE — Progress Notes (Signed)
Subjective:     Patient ID: [name], female   DOB: [birth year], 70 y.o.   MRN: [id number]  HPI The patient is a 70 year old lady from [place]. She has a very complicated surgical history. I believe her history started with a duodenal perforation during an ERCP. She had her duodenum repaired in her gallbladder removed. 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 A lot of the pains that she was having in her hands and arms has resolved now. Her appetite is improving and her ostomy is working well. She still has a little granulation tissue at the tube site on the right upper quadrant.  Review of Systems  Constitutional: Negative.   HENT: Negative.   Eyes: Negative.   Respiratory: Negative.   Cardiovascular: Negative.   Gastrointestinal: Negative.   Genitourinary: Negative.   Musculoskeletal: Negative.   Skin: Negative.   Neurological: Negative.   Hematological: Negative.   Psychiatric/Behavioral: Negative.        Objective:   Physical Exam  Constitutional: She is oriented to person, place, and time. She appears well-developed and well-nourished.  HENT:  Head: Normocephalic and atraumatic.  Eyes: Conjunctivae and EOM are normal. Pupils are equal, round, and reactive to light.  Neck: Normal range of motion. Neck supple.  Cardiovascular: Normal rate, regular rhythm and normal heart sounds.   Pulmonary/Chest: Effort normal and breath sounds normal.  Abdominal: Soft. Bowel sounds are normal.       Ostomy is pink and productive. She is a little bit of granulation tissue at the right upper quadrant tube site. Otherwise abdomen is soft and nontender.  Musculoskeletal:       Stiffness of the right shoulder  Neurological: She is alert and oriented to person, place, and time.  Skin: Skin is warm and dry.  Psychiatric: She has a normal mood and affect. Her behavior is normal.       Assessment:     Postop    Plan:     She is probably greater than 6 months  postop from a complicated abdominal course. She is much improved from any other times not seen her. We treated some granulation tissue today in her right upper quadrant. She tolerated this well. At this point if she wants to have her ostomy reversed she will need to get an academic center to be evaluated. Otherwise though I think she is doing great.

## 2011-09-21 ENCOUNTER — Encounter (HOSPITAL_COMMUNITY): Payer: Self-pay

## 2011-09-21 ENCOUNTER — Emergency Department (HOSPITAL_COMMUNITY): Payer: Medicaid Other

## 2011-09-21 ENCOUNTER — Emergency Department (HOSPITAL_COMMUNITY)
Admission: EM | Admit: 2011-09-21 | Discharge: 2011-09-21 | Disposition: A | Discharge status: Home / Self Care | Payer: Medicaid Other | Attending: Emergency Medicine | Admitting: Emergency Medicine

## 2011-09-21 DIAGNOSIS — R109 Unspecified abdominal pain: Secondary | ICD-10-CM | POA: Insufficient documentation

## 2011-09-21 DIAGNOSIS — Z433 Encounter for attention to colostomy: Secondary | ICD-10-CM | POA: Insufficient documentation

## 2011-09-21 DIAGNOSIS — J45909 Unspecified asthma, uncomplicated: Secondary | ICD-10-CM | POA: Insufficient documentation

## 2011-09-21 LAB — COMPREHENSIVE METABOLIC PANEL
Albumin: 3.3 g/dL — ABNORMAL LOW (ref 3.5–5.2)
Alkaline Phosphatase: 137 U/L — ABNORMAL HIGH (ref 39–117)
BUN: 19 mg/dL (ref 6–23)
CO2: 24 mEq/L (ref 19–32)
Chloride: 107 mEq/L (ref 96–112)
Creatinine, Ser: 1.1 mg/dL (ref 0.50–1.10)
GFR calc non Af Amer: 50 mL/min — ABNORMAL LOW (ref >90)
Glucose, Bld: 100 mg/dL — ABNORMAL HIGH (ref 70–99)
Potassium: 4.2 mEq/L (ref 3.5–5.1)
Total Bilirubin: 0.5 mg/dL (ref 0.3–1.2)

## 2011-09-21 LAB — DIFFERENTIAL
Lymphocytes Relative: 33 % (ref 12–46)
Lymphs Abs: 2.3 10*3/uL (ref 0.7–4.0)
Monocytes Absolute: 0.4 10*3/uL (ref 0.1–1.0)
Monocytes Relative: 6 % (ref 3–12)
Neutro Abs: 4 10*3/uL (ref 1.7–7.7)
Neutrophils Relative %: 58 % (ref 43–77)

## 2011-09-21 LAB — CBC
HCT: 32.9 % — ABNORMAL LOW (ref 36.0–46.0)
Hemoglobin: 10.8 g/dL — ABNORMAL LOW (ref 12.0–15.0)
MCHC: 32.8 g/dL (ref 30.0–36.0)
RBC: 3.81 MIL/uL — ABNORMAL LOW (ref 3.87–5.11)
WBC: 6.9 10*3/uL (ref 4.0–10.5)

## 2011-09-21 LAB — LIPASE, BLOOD: Lipase: 42 U/L (ref 11–59)

## 2011-09-21 NOTE — ED Provider Notes (Signed)
Medical screening examination/treatment/procedure(s) were conducted as a shared visit with non-physician practitioner(s) and myself.  I personally evaluated the patient during the encounter   [name] A. Patrica Duel, MD 09/25/11 [phone]

## 2011-09-21 NOTE — ED Provider Notes (Signed)
History     CSN: [id number] Arrival date & time: 09/21/2011 11:58 AM   First MD Initiated Contact with Patient 09/21/11 1227      Chief Complaint  Patient presents with  . Abdominal Pain    (Consider location/radiation/quality/duration/timing/severity/associated sxs/prior treatment) Patient is a 70 y.o. female presenting with abdominal pain. The history is provided by the patient.  Abdominal Pain The primary symptoms of the illness include abdominal pain. The primary symptoms of the illness do not include fever or vomiting. The current episode started yesterday. The onset of the illness was gradual. The problem has not changed since onset. The abdominal pain began yesterday (Patient has a colostomy. She reports pain the LLQ associated with ostomy and in RUQ as well. Patient reports that this is the same pain he has had multiple times in the past. ). The pain came on gradually. The abdominal pain is located in the LLQ and RUQ. The abdominal pain does not radiate.  The patient states that she believes she is currently not pregnant. Symptoms associated with the illness do not include chills.    Past Medical History  Diagnosis Date  . Asthma     Past Surgical History  Procedure Date  . Colostomy   . Colon surgery     History reviewed. No pertinent family history.  History  Substance Use Topics  . Smoking status: Never Smoker   . Smokeless tobacco: Not on file  . Alcohol Use: No    OB History    Grav Para Term Preterm Abortions TAB SAB Ect Mult Living                  Review of Systems  Constitutional: Negative for fever and chills.  HENT: Negative.   Respiratory: Negative.   Cardiovascular: Negative.   Gastrointestinal: Positive for abdominal pain. Negative for vomiting.       See HPI.  Genitourinary: Negative.   Musculoskeletal: Negative.   Skin: Negative.   Neurological: Negative.     Allergies  Review of patient's allergies indicates no known  allergies.  Home Medications  No current outpatient prescriptions on file.  BP 119/72  Pulse 90  Temp(Src) 97.2 F (36.2 C) (Oral)  Resp 14  SpO2 98%  Physical Exam  Constitutional: She appears well-developed and well-nourished.  HENT:  Head: Normocephalic.  Neck: Normal range of motion. Neck supple.  Cardiovascular: Normal rate and regular rhythm.   Pulmonary/Chest: Effort normal and breath sounds normal.  Abdominal: Soft. Bowel sounds are normal. She exhibits no mass. There is tenderness. There is no rebound and no guarding.       Colostomy bag in place. Stoma unremarkable. Stool appears with normal color, no bleeding, or melena.   Musculoskeletal: Normal range of motion.  Neurological: She is alert. No cranial nerve deficit.  Skin: Skin is warm and dry. No rash noted.  Psychiatric: She has a normal mood and affect.    ED Course  Procedures (including critical care time)   Labs Reviewed  CBC  DIFFERENTIAL  COMPREHENSIVE METABOLIC PANEL  LIPASE, BLOOD   No results found.   No diagnosis found.    MDM  Re-evaluation:  The patient's colostomy bag is changed and supplies are given to patient by colostomy nurse. Abdominal pain is resolved. Abdomen is nontender.        Rodena Medin, PA 09/21/11 [phone]

## 2011-09-21 NOTE — ED Notes (Signed)
Wound care nurse called for stoma care on pt.  Sts that she will be down as soon as she can.

## 2011-09-21 NOTE — ED Notes (Signed)
Patient presents with pain from colostomy site. Colostomy bag present and is draining. Denies nausea and vomiting.

## 2011-09-21 NOTE — ED Notes (Signed)
Pt here with c/o chronic abd pain that started again last night and family also reports that they are out of colostomy supplies.  Pt unable to rate her pain.

## 2011-09-21 NOTE — ED Provider Notes (Signed)
History     CSN: [id number] Arrival date & time: 09/21/2011 11:58 AM   First MD Initiated Contact with Patient 09/21/11 1227      Chief Complaint  Patient presents with  . Abdominal Pain    (Consider location/radiation/quality/duration/timing/severity/associated sxs/prior treatment) HPI  Past Medical History  Diagnosis Date  . Asthma     Past Surgical History  Procedure Date  . Colostomy   . Colon surgery     History reviewed. No pertinent family history.  History  Substance Use Topics  . Smoking status: Never Smoker   . Smokeless tobacco: Not on file  . Alcohol Use: No    OB History    Grav Para Term Preterm Abortions TAB SAB Ect Mult Living                  Review of Systems  Allergies  Review of patient's allergies indicates no known allergies.  Home Medications  No current outpatient prescriptions on file.  BP 131/78  Pulse 90  Temp(Src) 97.2 F (36.2 C) (Oral)  Resp 16  SpO2 98%  Physical Exam  ED Course  Procedures (including critical care time)  Labs Reviewed  CBC - Abnormal; Notable for the following:    RBC 3.81 (*)    Hemoglobin 10.8 (*)    HCT 32.9 (*)    All other components within normal limits  COMPREHENSIVE METABOLIC PANEL - Abnormal; Notable for the following:    Glucose, Bld 100 (*)    Albumin 3.3 (*)    Alkaline Phosphatase 137 (*)    GFR calc non Af Amer 50 (*)    GFR calc Af Amer 58 (*)    All other components within normal limits  DIFFERENTIAL  LIPASE, BLOOD   Dg Abd Acute W/chest  09/21/2011  *RADIOLOGY REPORT*  Clinical Data: Left abdominal pain.  ACUTE ABDOMEN SERIES (ABDOMEN 2 VIEW & CHEST 1 VIEW)  Comparison: Chest radiograph 06/08/2011 and CT abdomen pelvis 05/22/2011.  Findings: Frontal view of the chest shows midline trachea and normal heart size.  Biapical pleural parenchymal scarring.  Lungs are otherwise clear.  No pleural fluid.  Two views of the abdomen show a normal bowel gas pattern.  No obstruction  or free air.  Surgical clips are seen in the right upper quadrant.  IMPRESSION: No acute findings.  Original Report Authenticated By: Reyes Ivan, M.D.     No diagnosis found.    MDM          Medical screening examination/treatment/procedure(s) were conducted as a shared visit with non-physician practitioner(s) and myself.  I personally evaluated the patient during the encounter   Meeya Goldin A. Patrica Duel, MD 09/21/11 1457

## 2011-09-21 NOTE — Consult Note (Signed)
WOC ostomy consult (While in ER) Pt  Familiar to ostomy service from multiple previous admissions, had previous fistula which healed and ileostomy.  Admitted to ER for pouch leaking, no supplies, and abd pain.  Stoma red and viable slightly above skin level, skin intact surrounding stoma.  Daughter familiar with pouching routines and performs all care.  50cc watery brown stool in pouch.  Applied new pouch, no apparent problems with stoma.  Free samples given and phone number of [facility] for future supplies. 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  Will not plan to follow further unless re-consulted.  [address], RN, MSN, [employer]  [phone]

## 2011-11-24 ENCOUNTER — Ambulatory Visit (INDEPENDENT_AMBULATORY_CARE_PROVIDER_SITE_OTHER): Payer: Medicaid Other | Admitting: General Surgery

## 2011-11-24 ENCOUNTER — Encounter (INDEPENDENT_AMBULATORY_CARE_PROVIDER_SITE_OTHER): Payer: Self-pay | Admitting: General Surgery

## 2011-11-24 VITALS — BP 122/86 | HR 84 | Temp 97.1°F | Resp 20 | Ht <= 58 in | Wt 94.2 lb

## 2011-11-24 DIAGNOSIS — Z932 Ileostomy status: Secondary | ICD-10-CM

## 2011-11-24 NOTE — Progress Notes (Signed)
Subjective:     Patient ID: [name], female   DOB: 08/27/1941, 71 y.o.   MRN: [id number]  HPI The patient is a 71 year old female from [place]. She is a very competent and surgical history. She initially had a duodenal injury during an ERCP. This was repaired in her gallbladder was removed at the same time. She then developed a leak from the repair. She also necrosed or right colon. She required a right colectomy and ileostomy. She had a very long complicated postoperative course. She now is at home and seems to be doing well. Her ostomy is working well. She started to gain weight on her own. The pain in her arms is improving.  Review of Systems  Constitutional: Negative.   HENT: Negative.   Eyes: Negative.   Respiratory: Negative.   Cardiovascular: Negative.   Gastrointestinal: Negative.   Genitourinary: Negative.   Musculoskeletal: Positive for arthralgias.  Neurological: Negative.   Hematological: Negative.   Psychiatric/Behavioral: Negative.        Objective:   Physical Exam  Constitutional: She is oriented to person, place, and time. She appears well-developed and well-nourished.  HENT:  Head: Normocephalic and atraumatic.  Eyes: Conjunctivae and EOM are normal. Pupils are equal, round, and reactive to light.  Neck: Normal range of motion. Neck supple.  Cardiovascular: Normal rate, regular rhythm and normal heart sounds.   Pulmonary/Chest: Effort normal and breath sounds normal.  Abdominal: Soft. Bowel sounds are normal.       Ostomy is pink and productive. She has a small area of granulation in the right upper quadrant where her drain tubes were.  Musculoskeletal: Normal range of motion.  Neurological: She is alert and oriented to person, place, and time.  Skin: Skin is warm and dry.  Psychiatric: She has a normal mood and affect. Her behavior is normal.       Assessment:     Status post repair of a duodenal perforation and right colectomy with ileostomy    Plan:       At this point she finally seems to be thriving. She starting to gain weight and looking healthy. We treated the granulation tissue with some silver nitrate and she tolerated this well. At this point I think she  can return to see [place] on a p.r.n. basis. If she ever wants to consider trying to have her ostomy reversed I would recommend an evaluation and an academic center

## 2011-12-09 ENCOUNTER — Encounter (HOSPITAL_COMMUNITY): Payer: Self-pay | Admitting: *Deleted

## 2011-12-09 ENCOUNTER — Telehealth (INDEPENDENT_AMBULATORY_CARE_PROVIDER_SITE_OTHER): Payer: Self-pay | Admitting: General Surgery

## 2011-12-09 ENCOUNTER — Emergency Department (HOSPITAL_COMMUNITY)
Admission: EM | Admit: 2011-12-09 | Discharge: 2011-12-09 | Disposition: A | Discharge status: Home / Self Care | Payer: Medicaid Other | Attending: Emergency Medicine | Admitting: Emergency Medicine

## 2011-12-09 DIAGNOSIS — T859XXA Unspecified complication of internal prosthetic device, implant and graft, initial encounter: Secondary | ICD-10-CM

## 2011-12-09 DIAGNOSIS — J45909 Unspecified asthma, uncomplicated: Secondary | ICD-10-CM | POA: Insufficient documentation

## 2011-12-09 DIAGNOSIS — L298 Other pruritus: Secondary | ICD-10-CM | POA: Insufficient documentation

## 2011-12-09 DIAGNOSIS — IMO0002 Reserved for concepts with insufficient information to code with codable children: Secondary | ICD-10-CM | POA: Insufficient documentation

## 2011-12-09 DIAGNOSIS — L2989 Other pruritus: Secondary | ICD-10-CM | POA: Insufficient documentation

## 2011-12-09 MED ORDER — OXYCODONE-ACETAMINOPHEN 5-325 MG PO TABS
1.0000 | ORAL_TABLET | Freq: Once | ORAL | Status: AC
  Administered 2011-12-09: 1 via ORAL
  Filled 2011-12-09: qty 1

## 2011-12-09 NOTE — ED Notes (Signed)
Family reports LLQ pain around colostomy site x 15 days. Denies n/v.

## 2011-12-09 NOTE — ED Notes (Signed)
Per telephone interpreter service (language: Tery Sanfilippo), pt c/o pain/ itching/ burning around colostomy site ("where plastic bag is") x 15 days.  States has had leaking around plastic bag.  Attempted to get more history, however, pt is poor historian and only able to obtain the following information: pt had surgery in Dec 2011 for "stone" - hospitalized x 6 months after surgery and then in nursing home x 1 month.  Denies issues with colostomy until 2 weeks ago at symptoms onset.  Also reports issues with obtaining colostomy supplies.  States only med she takes at home is inhaler for asthma.  Denies allergies.  Pt gowned. Dr. Effie Shy notified of hx obtained and at bedside at 1205 with interpreter on telephone.

## 2011-12-09 NOTE — ED Notes (Signed)
Case Management in with patient.  [facility] has been notified of order and will visit on 12/10/11 or 12/11/11. Explained to patient via language line by Case Management

## 2011-12-09 NOTE — Progress Notes (Signed)
Contacted to assist patient in caring for her ostomy without needing to come to the hospital. Via the interpreter line, had extensive conversation with the patient as did the wound nurse. Patient was in agreement with and thankful for setting up [facility] for home health RN services. Obtained son's cell number ([phone]) so HH can speak with a family member who speaks [place]. In addition, son will be picking up patient today at 5:00pm. Corrie Dandy from [facility] knows to contact son and arrange for a RN evaluation time on Thursday or Friday. Dr. Jewel Baize Siy-Hian of [facility] is the patient's PCP. Beth at [facility] agrees that physician will order extended visits for [facility] care and [facility] of [facility] should contact her or any of the nurses. I faxed completed reports in addition to the surgeon note of two weeks ago per Beth's request to [facility] (tryiing to see if can get sooner appt). In addition, I scheduled a hospital follow-up appt for patient on 3/27 at 11:15am.

## 2011-12-09 NOTE — ED Provider Notes (Signed)
History     CSN: [id number]  Arrival date & time 12/09/11  1116   First MD Initiated Contact with Patient 12/09/11 1147      Chief Complaint  Patient presents with  . Abdominal Pain    (Consider location/radiation/quality/duration/timing/severity/associated sxs/prior treatment) HPI Interviewed with telephone translator [name] is a 71 y.o. female presents with c/o pain and itching around her colostomy site leading to desire to be assessed in the ED. The sx(s) have been present for several days. Additional concerns are running out of colostomy supplies. Causative factors are nothing. Palliative factors are nothing. The distress associated is mild. The disorder has been present for several days. She has no PCP.    Past Medical History  Diagnosis Date  . Asthma   . Chest pain     Past Surgical History  Procedure Date  . Colostomy   . Colon surgery     History reviewed. No pertinent family history.  History  Substance Use Topics  . Smoking status: Never Smoker   . Smokeless tobacco: Not on file  . Alcohol Use: No    OB History    Grav Para Term Preterm Abortions TAB SAB Ect Mult Living                  Review of Systems  All other systems reviewed and are negative.    Allergies  Review of patient's allergies indicates no known allergies.  Home Medications  No current outpatient prescriptions on file.  BP 129/79  Pulse 75  Temp(Src) 98.2 F (36.8 C) (Oral)  Resp 20  SpO2 97%  Physical Exam  Nursing note and vitals reviewed. Constitutional: She is oriented to person, place, and time. She appears well-developed and well-nourished.  HENT:  Head: Normocephalic and atraumatic.  Eyes: Conjunctivae and EOM are normal. Pupils are equal, round, and reactive to light.  Neck: Normal range of motion and phonation normal. Neck supple.  Cardiovascular: Normal rate, regular rhythm and intact distal pulses.   Pulmonary/Chest: Effort normal and breath sounds  normal. She exhibits no tenderness.  Abdominal: Soft. She exhibits no distension. There is no tenderness. There is no guarding.       Colostomy bag removed- superficial skin breakdown surrounding the ostomy site. The ostomy itself appears normal with mucosa without bleeding, and normal stool extruding out.  Musculoskeletal: Normal range of motion.  Neurological: She is alert and oriented to person, place, and time. She has normal strength. She exhibits normal muscle tone.  Skin: Skin is warm and dry.  Psychiatric: She has a normal mood and affect. Her behavior is normal. Judgment and thought content normal.    ED Course  Procedures (including critical care time)  Labs Reviewed - No data to display No results found.  The patient was seen in the ED by the ostomy nurse. She feels that the ostomy bags the patient is using has a too large apature. An appropriate bag was applied agter wound care.  Care Management arranged in home health care and assessment with [facility].     1. Complication of GI device       MDM  Skin breakdown secondary to ostomy device complication. Stable for d/c. D/C teaching and planning done.        [name], MD 12/09/11 1730

## 2011-12-09 NOTE — ED Notes (Signed)
Daughter leaving at present and states through language line that her brother will be back at 5 pm to pick up their mother.  Ostomy care nurse presenting working with patient on supplies for ostomy care.

## 2011-12-09 NOTE — ED Notes (Signed)
Ostomy Care nurse in with patient to assess ostomy.

## 2011-12-09 NOTE — ED Notes (Signed)
Melodie, wound/ ostomy nurse at [facility], notified of need for evaluation/ treatment of excoriated skin around pt's colostomy.

## 2011-12-09 NOTE — ED Notes (Signed)
Pt provided phone number for son, pt unable to contact him, I left message for return call, will continue to follow up with contacting the family

## 2011-12-09 NOTE — Telephone Encounter (Signed)
[name]-Case manager at [facility] would like [place] to write orders for the patient to have ostomy care through [facility]. The patient is coming to the hospital for ostomy supplies and advise on care.

## 2011-12-09 NOTE — Consult Note (Signed)
WOC consult Note Reason for Consult: req to eval pt for peristomal breakdown.  This pt is well know to the [facility] team, she has LLQ ileostomy and comes into ER when she runs out of supplies and from time to time for peristomal denudation.  Today she presents with circumferential denudation of the peristomal area that extends 3cm.  She has two ostomy pouches in the room that she has brought from home that are precut at 1 3/4" and today her stoma size is only 7/8", therefore the skin breakdown is consistent with the exposure of the stool to skin. Using a phone translator I have explained that she can not use this size opening any longer, it is too large.  I have precut 5 pouches for her at the 7/8" inch size and treated her peristomal skin with ostomy powder by applying several layers of powder and "crusting" with no sting skin barrier . I have demonstrated this procedure to the pt and the daughter.  I have had pt open and close the pouch placed today because she has been using a clamp closure and the closure on this pouch is different, she was able to demonstrate opening and closing of this pouch to me.  Used language phone interpreter to have pt ask me questions. She seems to not really understand the concept of ordering or purchasing her ostomy supplies.  She has reported [facility] and they will cover the pouches from the following mail order companies  [facility] 1 [phone] or [facility] [phone]  She has had a referral for [facility] and I have contacted the  [facility] coordinator to share the above information with her as well. I believe if this pt could have a few HH RN  Visits to treat the peristomal skin and make sure they understand to treat this area with powder until healed then hook her up with mail order supplies she would be independent with her care.   With the phone translator I have also verified she has a son and daughter in law in the home that read english.  I have written out pouch change  instructions for her and given her the contact number for [facility] in case they would prefer to obtain supplies locally.  The CM and myself have instructed her that she should obtain supplies via the mail order or local supplier instead of trip to ER for these supplies.    [name], [place] [phone]

## 2011-12-10 ENCOUNTER — Other Ambulatory Visit (INDEPENDENT_AMBULATORY_CARE_PROVIDER_SITE_OTHER): Payer: Self-pay | Admitting: General Surgery

## 2011-12-10 ENCOUNTER — Telehealth (INDEPENDENT_AMBULATORY_CARE_PROVIDER_SITE_OTHER): Payer: Self-pay | Admitting: General Surgery

## 2011-12-10 DIAGNOSIS — Z932 Ileostomy status: Secondary | ICD-10-CM

## 2011-12-10 NOTE — Telephone Encounter (Signed)
DR. Carolynne Edouard PLACED ORDER IN EPIC FOR REQ SERVICES/GY

## 2011-12-10 NOTE — Telephone Encounter (Signed)
I RECEIVED A MESSAGE FROM ANN/CASE MANAGER FROM [facility] THAT SHE REQ BE PLACED IN EPIC BY DR. TOTH RE HOME HEALTH/SUPPLIES FOR OSTOMY CARE/ I CALLED ANN AND SHE GAVE ME INSTRUCTIONS FOR DR. TOTH AND ORDER SEQUENCE/ DR. TOTH  ENTERED ORDERS INTO EPIC PER INSTRUCTIONS FROM ANN/GY

## 2012-05-24 ENCOUNTER — Encounter (HOSPITAL_COMMUNITY): Payer: Self-pay | Admitting: *Deleted

## 2012-05-24 ENCOUNTER — Emergency Department (HOSPITAL_COMMUNITY)
Admission: EM | Admit: 2012-05-24 | Discharge: 2012-05-24 | Disposition: A | Discharge status: Home / Self Care | Payer: Medicaid Other | Attending: Emergency Medicine | Admitting: Emergency Medicine

## 2012-05-24 DIAGNOSIS — R109 Unspecified abdominal pain: Secondary | ICD-10-CM | POA: Insufficient documentation

## 2012-05-24 DIAGNOSIS — Z433 Encounter for attention to colostomy: Secondary | ICD-10-CM

## 2012-05-24 DIAGNOSIS — J45909 Unspecified asthma, uncomplicated: Secondary | ICD-10-CM | POA: Insufficient documentation

## 2012-05-24 NOTE — Consult Note (Signed)
WOC ostomy consult  Stoma type/location: Consult requested for previous ileostomy.  Pt familiar to ostomy service from previous admissions and has had stoma for several years.  Daughter at bedside, she and patient are familiar with pouching routines and emptying.  State they do not have any other bags, but that home health is providing supplies and visiting.  There is some miscommunication related to a limited language barrier.  Daughter's [place] is fairly good.  Notes indicate that patient has been arranged for further follow-up with [facility] and case manager has attempted to assist with supplies.  Daughter states pouches are only lasting 2 days.  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 Stomal assessment/size: 3/4inch, slightly above skin level. Peristomal assessment: Skin intact without lesions or irritation. Output 50cc brown liquid stool. Ostomy pouching: Applied one piece Kayara pouch. Daughter states they have a belt at home.  Encouraged to use belt to help pouch adhere if pt develops a more pronounced hernia.  Box of free ostomy supplies given to patient. If home health plans to follow, they will help obtain further supplies.  Pt has previously been placed on the [facility] plan and has used up the time limit eligibility. Will not plan to follow further unless re-consulted.  [address] RN, MSN, [employer]  [phone]

## 2012-05-24 NOTE — Progress Notes (Signed)
I have scheduled [facility] for RN care for the ostomy and have contacted [facility]) and scheduled hospital follow-up appt. With Dr. Philipp Deputy for tomorrow (7/17) at 9:45am. This information has been placed in the discharge instructions and I have made the attending physician aware.

## 2012-05-24 NOTE — Progress Notes (Signed)
Consulted to obtain colostomy supplies for patient. With only Medicaid, there is not a store locally that will file insurance when purchasing supplies. Contacted [facility] and initiated [facility] RN ostomy care. Patient has used [facility] in the past for the same issue. Attending physician is aware.

## 2012-05-24 NOTE — ED Provider Notes (Addendum)
History     CSN: [id number]  Arrival date & time 05/24/12  1112   First MD Initiated Contact with Patient 05/24/12 1149      Chief Complaint  Patient presents with  . Abdominal Pain    (Consider location/radiation/quality/duration/timing/severity/associated sxs/prior treatment) Patient is a 71 y.o. female presenting with abdominal pain. The history is provided by a relative. The history is limited by a language barrier. A language interpreter was used.  Abdominal Pain The primary symptoms of the illness include abdominal pain.    Past Medical History  Diagnosis Date  . Asthma   . Chest pain     Past Surgical History  Procedure Date  . Colostomy   . Colon surgery     No family history on file.  History  Substance Use Topics  . Smoking status: Never Smoker   . Smokeless tobacco: Not on file  . Alcohol Use: No    OB History    Grav Para Term Preterm Abortions TAB SAB Ect Mult Living                  Review of Systems  Gastrointestinal: Positive for abdominal pain.    Allergies  Review of patient's allergies indicates no known allergies.  Home Medications  No current outpatient prescriptions on file.  BP 151/83  Pulse 86  Temp 98.4 F (36.9 C) (Oral)  Resp 18  SpO2 98%  Physical Exam  Constitutional: She appears well-developed and well-nourished.  HENT:  Head: Normocephalic and atraumatic.  Eyes: Conjunctivae are normal.  Neck: Normal range of motion. Neck supple.  Pulmonary/Chest: Effort normal.  Abdominal: Soft.       Ostomy bag in place. No surrounding signs of infx  Musculoskeletal: Normal range of motion.  Neurological: She is alert.  Skin: Skin is warm and dry. No erythema.    ED Course  Procedures (including critical care time)  Labs Reviewed - No data to display No results found.   No diagnosis found.    MDM  Ostomy bag problem        Cheri Guppy, MD 05/24/12 1324  Cheri Guppy, MD 06/10/12 [phone]

## 2012-05-24 NOTE — ED Notes (Signed)
Spoke with wound ostomy nurse and case management about assisting patient with new colostomy bag and possibly have replacement bags delivered to their home.

## 2012-05-24 NOTE — ED Notes (Signed)
Pt has colostomy and now with left lower quad pain.  No Pain with urination

## 2012-06-21 IMAGING — CR DG CHEST 1V PORT
1 series · 1 of 1 positions shown · non-contrast
Comparison: Portable chest x-ray 07/17/2010 and 06/23/2010.  Two-
view chest x-ray 06/25/2010.

CLINICAL DATA: Chest pain.  Shortness of breath.  History of
asthma.

PORTABLE CHEST - 1 VIEW [DATE]/9211 1614 hours:

[view not recorded]
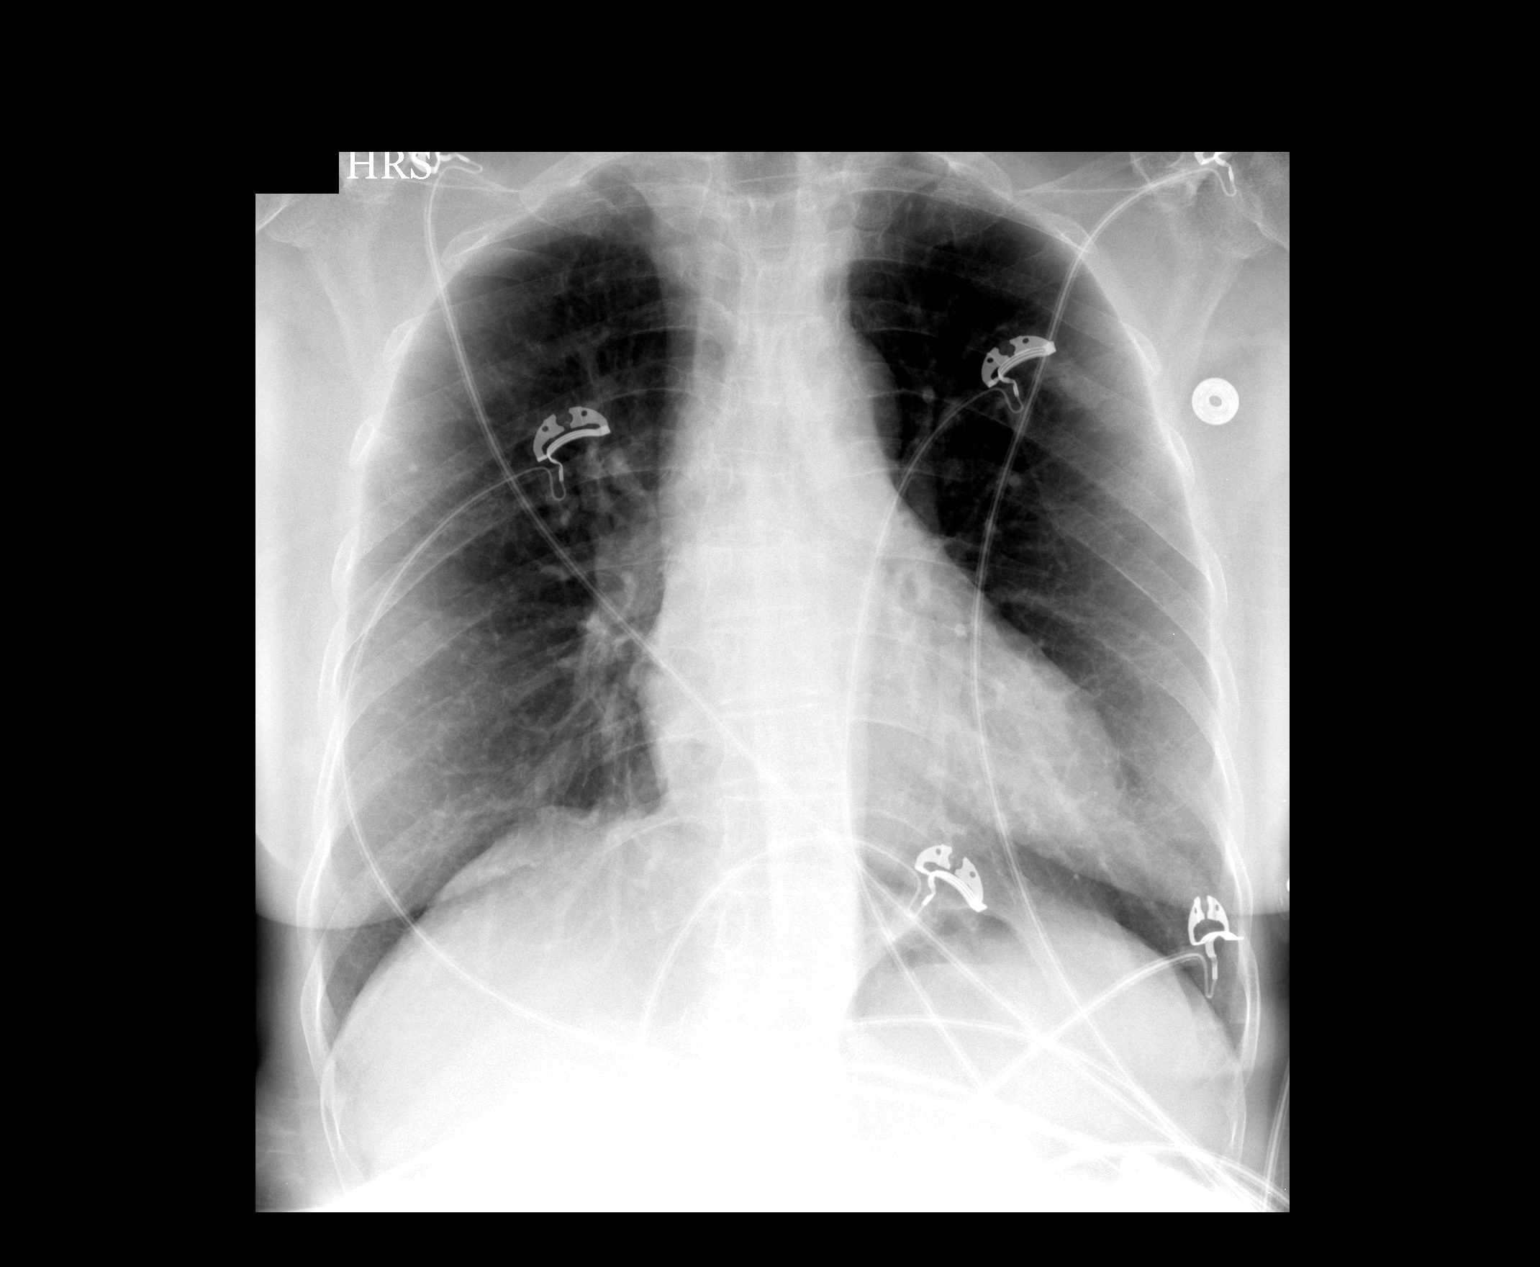

[1 of 1 positions shown; findings below may reference images not displayed]

FINDINGS: Cardiomediastinal silhouette unremarkable for age and AP
portable technique.  Very small calcified granuloma in the lateral
right upper lobe, unchanged.  Lungs hyperinflated but otherwise
clear.  No visible pleural effusions.
IMPRESSION: Hyperinflation consistent with COPD and/or asthma.  No acute
cardiopulmonary disease.

## 2012-06-22 IMAGING — CT CT ABD-PELV W/ CM
2 of 5 series · 17 of 46 positions shown, 19 images · IV contrast (omniscan)
Comparison: None.

CLINICAL DATA: Abdominal pain.  History of hepatitis.

CT ABDOMEN AND PELVIS WITH CONTRAST
TECHNIQUE: Multidetector CT imaging of the abdomen and pelvis was
performed following the standard protocol during bolus
administration of intravenous contrast.
Contrast: 100 ml Omniscan 300 IV contrast

[Series 2: routine abdomen · axial · 0.63mm/px · z∈[-435,-25]mm · 14 of 89 slices shown, 16 images]
[im 5/89  soft-tissue]
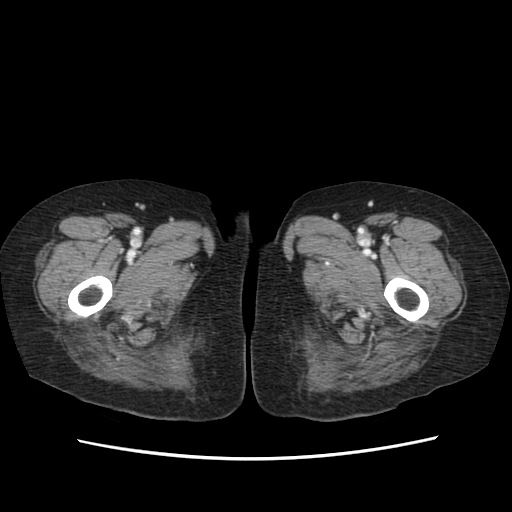
[im 5/89  bone]
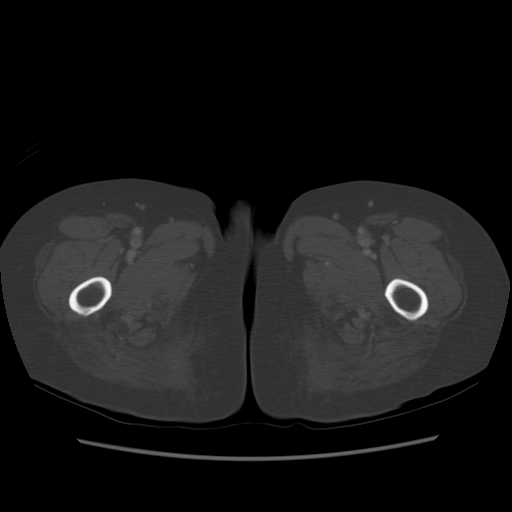
[im 13/89  soft-tissue]
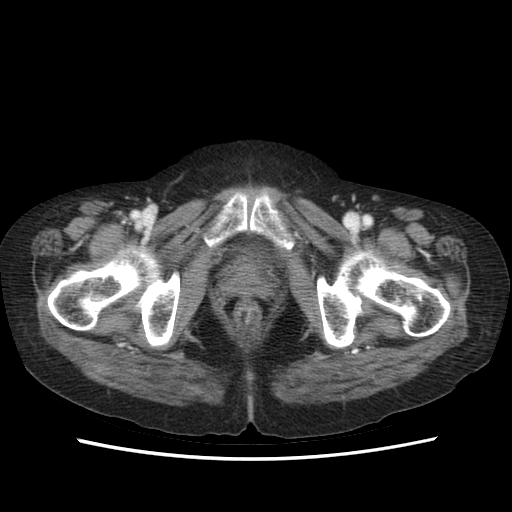
[im 17/89  soft-tissue]
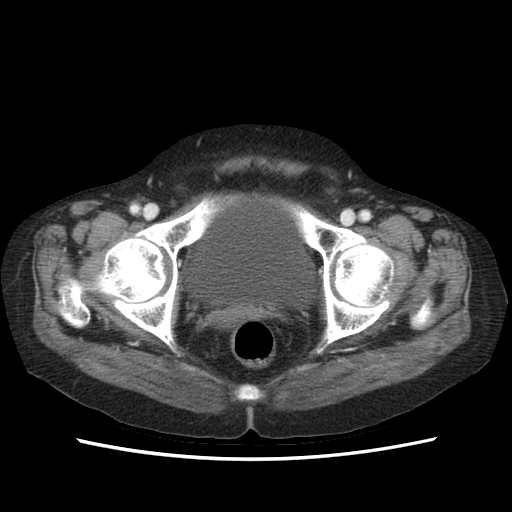
[im 26/89  soft-tissue]
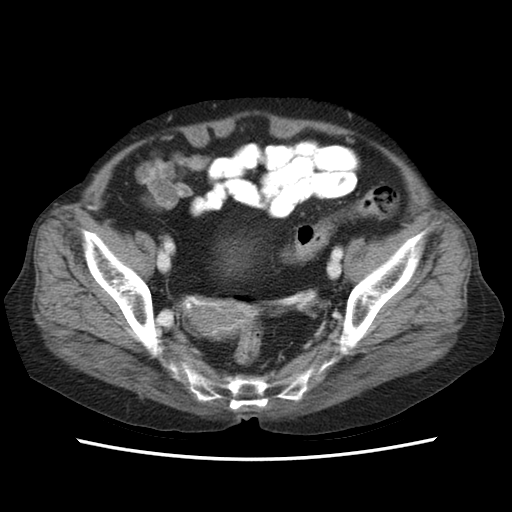
[im 30/89  soft-tissue]
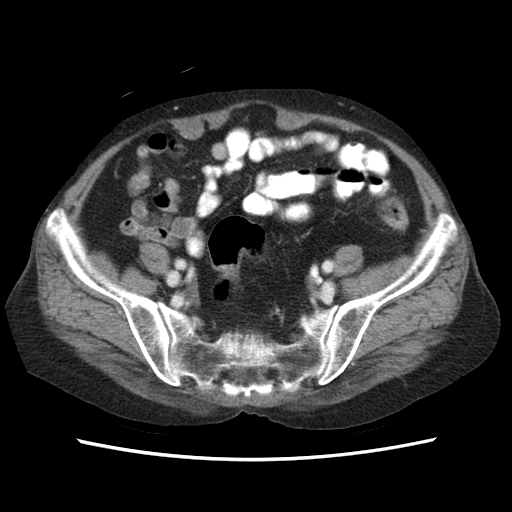
[im 34/89  soft-tissue]
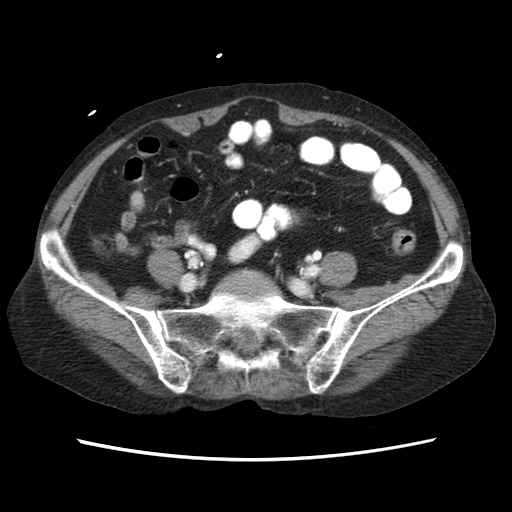
[im 42/89  soft-tissue]
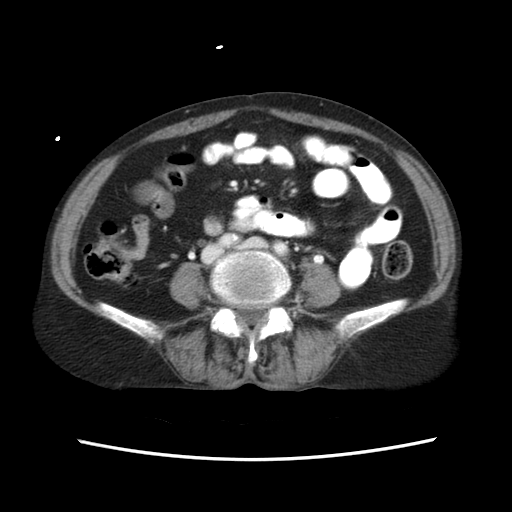
[im 47/89  soft-tissue]
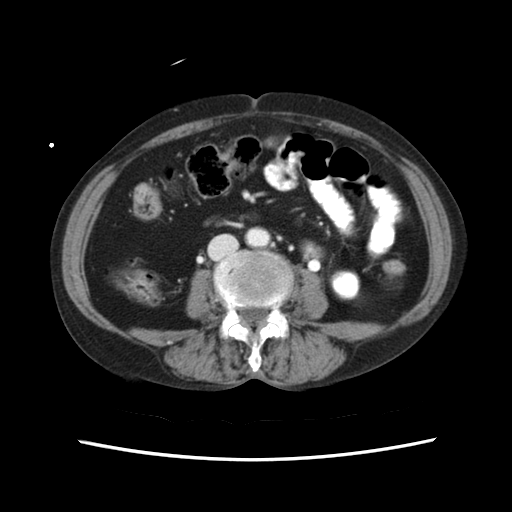
[im 55/89  soft-tissue]
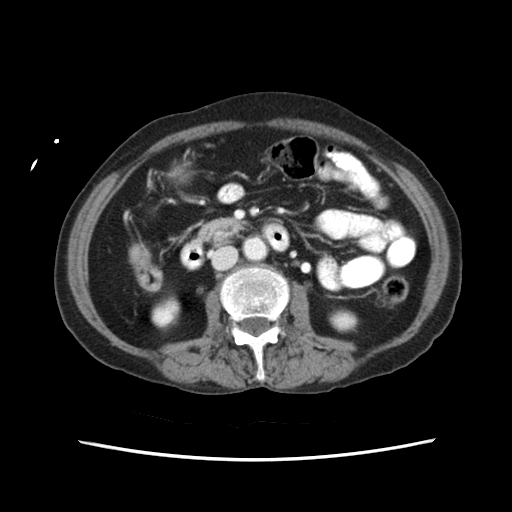
[im 55/89  bone]
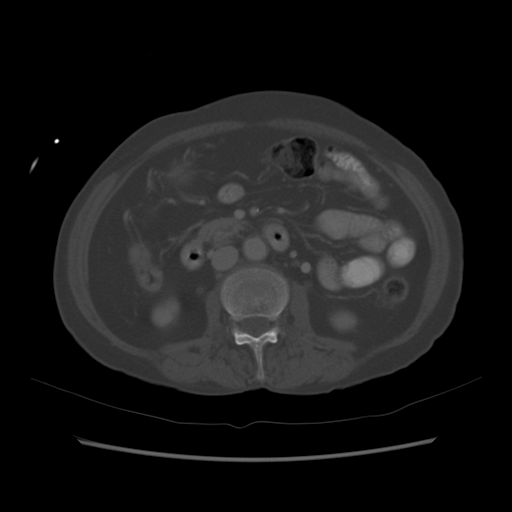
[im 59/89  soft-tissue]
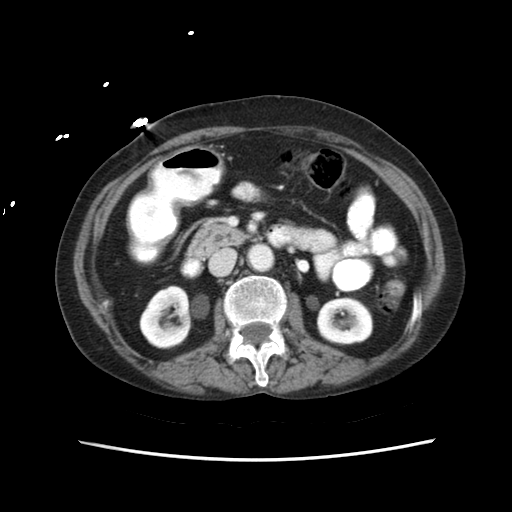
[im 68/89  soft-tissue]
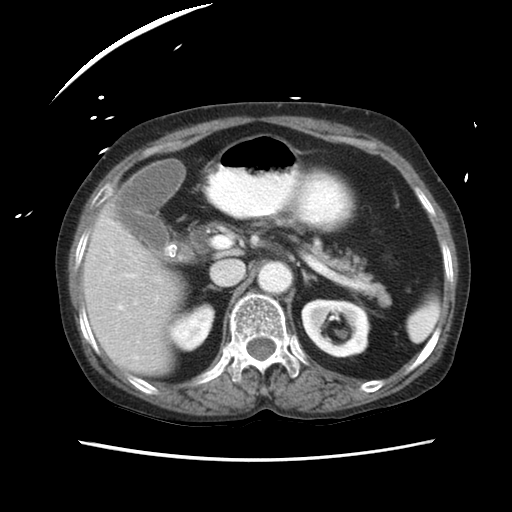
[im 72/89  soft-tissue]
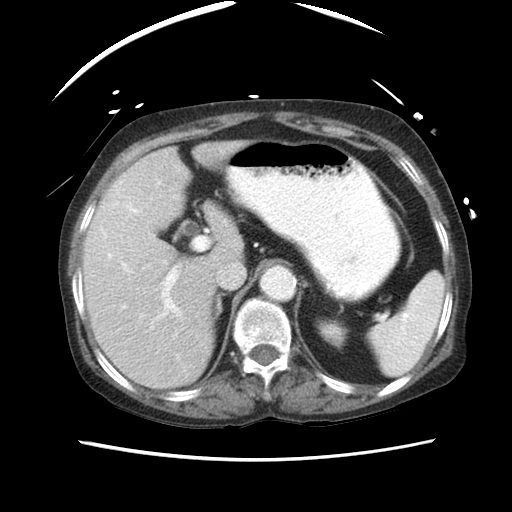
[im 76/89  soft-tissue]
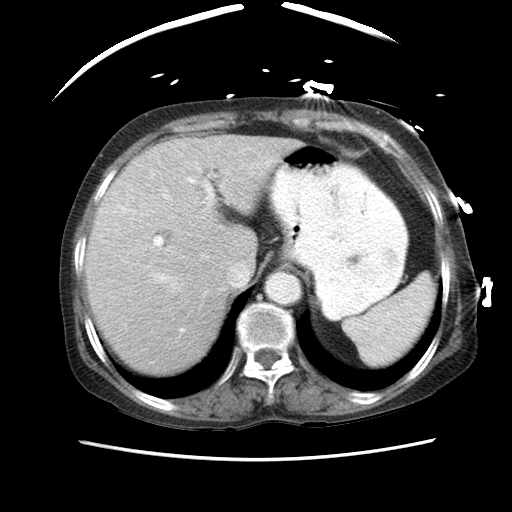
[im 84/89  soft-tissue]
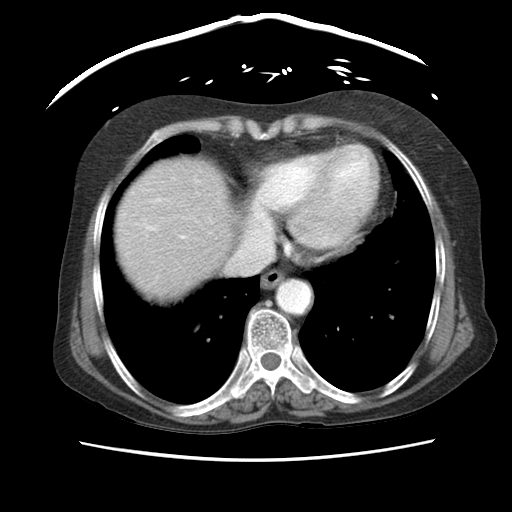

[Series 401: cor · coronal · 0.90mm/px · 3 of 109 slices shown]
[im 37/109  soft-tissue]
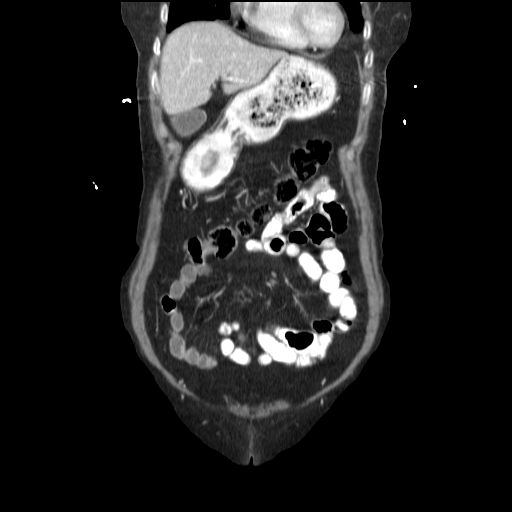
[im 49/109  soft-tissue]
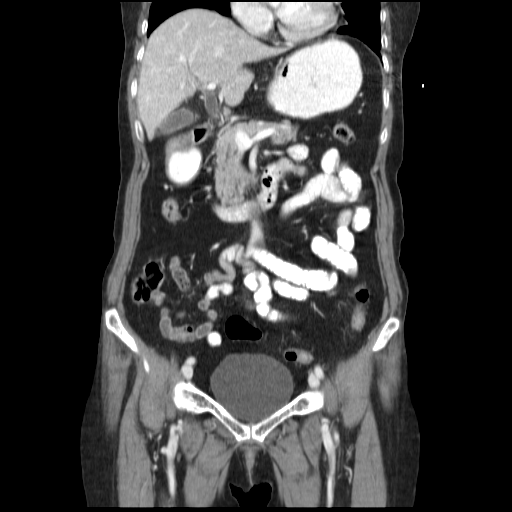
[im 61/109  soft-tissue]
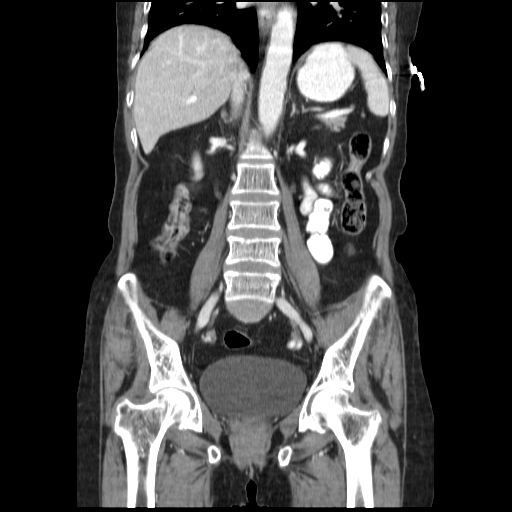

[17 of 46 positions shown; findings below may reference images not displayed]

FINDINGS: Gallstones are noted dependently within the neck of the
otherwise normal appearing gallbladder.  Liver, adrenal glands,
spleen are unremarkable.  Scattered tiny areas of bilateral renal
cortical scarring are noted.  No hydronephrosis or focal mass.  The
common duct is mildly dilated measuring 8 mm.  Within the
intrapancreatic portion of the common duct, there are several
apparent radiodensities within the duct measuring up to 3-4 mm, for
example images 33 and 36.  No pancreatic ductal dilatation is
identified.  No ascites or lymphadenopathy.

Uterus is atrophic but otherwise unremarkable.  Both ovaries appear
unremarkable.  Prominent calcification of both femoral veins is
incidentally noted which could reflect reflux and is generally an
incidental finding.  The bladder is physiologically distended.
Bowel is normal in appearance.  Scattered atherosclerotic aortic
calcification without aneurysm.  No acute osseous abnormality.
IMPRESSION: Several punctate radiopacities, possibly choledocholithiasis, with
mild proximal ductal dilatation to 8 mm.  No intrahepatic ductal
dilatation or pancreatic ductal dilatation.

Cholelithiasis without secondary CT evidence for acute
cholecystitis.

## 2012-06-28 ENCOUNTER — Encounter (HOSPITAL_COMMUNITY): Payer: Self-pay | Admitting: *Deleted

## 2012-06-28 ENCOUNTER — Emergency Department (HOSPITAL_COMMUNITY)
Admission: EM | Admit: 2012-06-28 | Discharge: 2012-06-28 | Disposition: A | Discharge status: Home / Self Care | Payer: Medicaid Other | Attending: Emergency Medicine | Admitting: Emergency Medicine

## 2012-06-28 DIAGNOSIS — Z432 Encounter for attention to ileostomy: Secondary | ICD-10-CM | POA: Insufficient documentation

## 2012-06-28 NOTE — ED Provider Notes (Signed)
History     CSN: [id number]  Arrival date & time 06/28/12  1003   First MD Initiated Contact with Patient 06/28/12 1022      Chief Complaint  Patient presents with  . Abdominal Pain    (Consider location/radiation/quality/duration/timing/severity/associated sxs/prior treatment) HPI  71 year old female in no acute distress presenting for ostomy bags and she is out. She reports a mild pain at 1/10 which her baseline and itching at the site. She denies any fever, nausea vomiting, weakness. She has no police and has no primary care provider due to difficulty in communication language barrier. She is here today with her neighbor who is translating.  Past Medical History  Diagnosis Date  . Asthma   . Chest pain     Past Surgical History  Procedure Date  . Colostomy   . Colon surgery     No family history on file.  History  Substance Use Topics  . Smoking status: Never Smoker   . Smokeless tobacco: Not on file  . Alcohol Use: No    OB History    Grav Para Term Preterm Abortions TAB SAB Ect Mult Living                  Review of Systems  Constitutional: Negative for fever.  All other systems reviewed and are negative.    Allergies  Review of patient's allergies indicates no known allergies.  Home Medications  No current outpatient prescriptions on file.  BP 128/84  Pulse 86  Temp 98.2 F (36.8 C) (Oral)  Resp 18  SpO2 96%  Physical Exam  Vitals reviewed. Constitutional: She is oriented to person, place, and time. She appears well-developed and well-nourished. No distress.  HENT:  Head: Normocephalic.  Eyes: Conjunctivae and EOM are normal. Pupils are equal, round, and reactive to light.  Cardiovascular: Normal rate.   Pulmonary/Chest: Effort normal.  Abdominal: Soft. Bowel sounds are normal. She exhibits no distension and no mass. There is no tenderness. There is no rebound and no guarding.       No skin breakdown ostomy site. No signs of infection:  Erythema, tenderness discharge et Karie Soda. Mucosa pink, Stool flowing from ostomy.  Musculoskeletal: Normal range of motion.  Neurological: She is alert and oriented to person, place, and time.  Skin: No erythema.  Psychiatric: She has a normal mood and affect.    ED Course  Procedures (including critical care time)  Labs Reviewed - No data to display No results found.   1. Ileostomy care       MDM  Family functioning ileostomy with no signs of tissue breakdown or infection. Patient is presenting for refill on supplies. She has presented multiple times in the past for similar. I will have the wound care nurse educator on outpatient means of obtaining the ostomy bags. She is stable for DC point.         Wynetta Emery, PA-C 06/28/12 1212

## 2012-06-28 NOTE — Consult Note (Signed)
WOC ostomy consult  Stoma type/location:  Pt with ostomy from several years ago.  Familiar to [facility] team from multiple previous admissions.  Most recent encounter was on 7/16 when pt came to the ER for ostomy supplies.  This is why she is here today.  Family has been provided information regarding purchasing supplies and the name of a ostomy supply vender which accepts [place] [employer].  Translator is giving this information to the family again today. Pt is familiar and independent which pouch application and emptying and denies any changes or problems since previous visit. Stomal assessment/size: 3/4 inch, red and viable, slightly above skin level. Peristomal assessment: Intact skin surrounding. Output  50cc liquid brown stool. Ostomy pouching: 1pc kayara pouch applied and several more given to patient. (7).  Education provided: Reinforced ostomy pouching routines and need to order own supplies. Will not plan to follow further unless re-consulted.  [address] RN, MSN, [employer]  [phone]

## 2012-06-28 NOTE — ED Notes (Signed)
Pt family has to leave. Pt will need a Nepali language interpreter

## 2012-06-28 NOTE — ED Notes (Signed)
NAD noted at time of d/c home. Pt waiting for ostomy supplies at this time

## 2012-06-28 NOTE — ED Notes (Signed)
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  No vomiting

## 2012-06-28 NOTE — ED Provider Notes (Signed)
Medical screening examination/treatment/procedure(s) were performed by non-physician practitioner and as supervising physician I was immediately available for consultation/collaboration.   Loren Racer, MD 06/28/12 1326

## 2012-06-28 NOTE — ED Notes (Signed)
Called WOC RN. Reporting will come down to see patient. Pouches ordered for patient

## 2012-06-28 NOTE — ED Notes (Signed)
Wound care RN at bedside  

## 2012-07-01 IMAGING — CR DG CHEST 1V PORT
1 series · 1 of 1 positions shown · non-contrast
Comparison: Portable chest x-rays 09/21/2010 and dating back to
09/19/2010.

CLINICAL DATA: Hepatitis.  Respiratory distress.  Follow up right
basilar atelectasis versus pneumonia.

PORTABLE CHEST - 1 VIEW [DATE]/0700 7332 hours:

[AP]
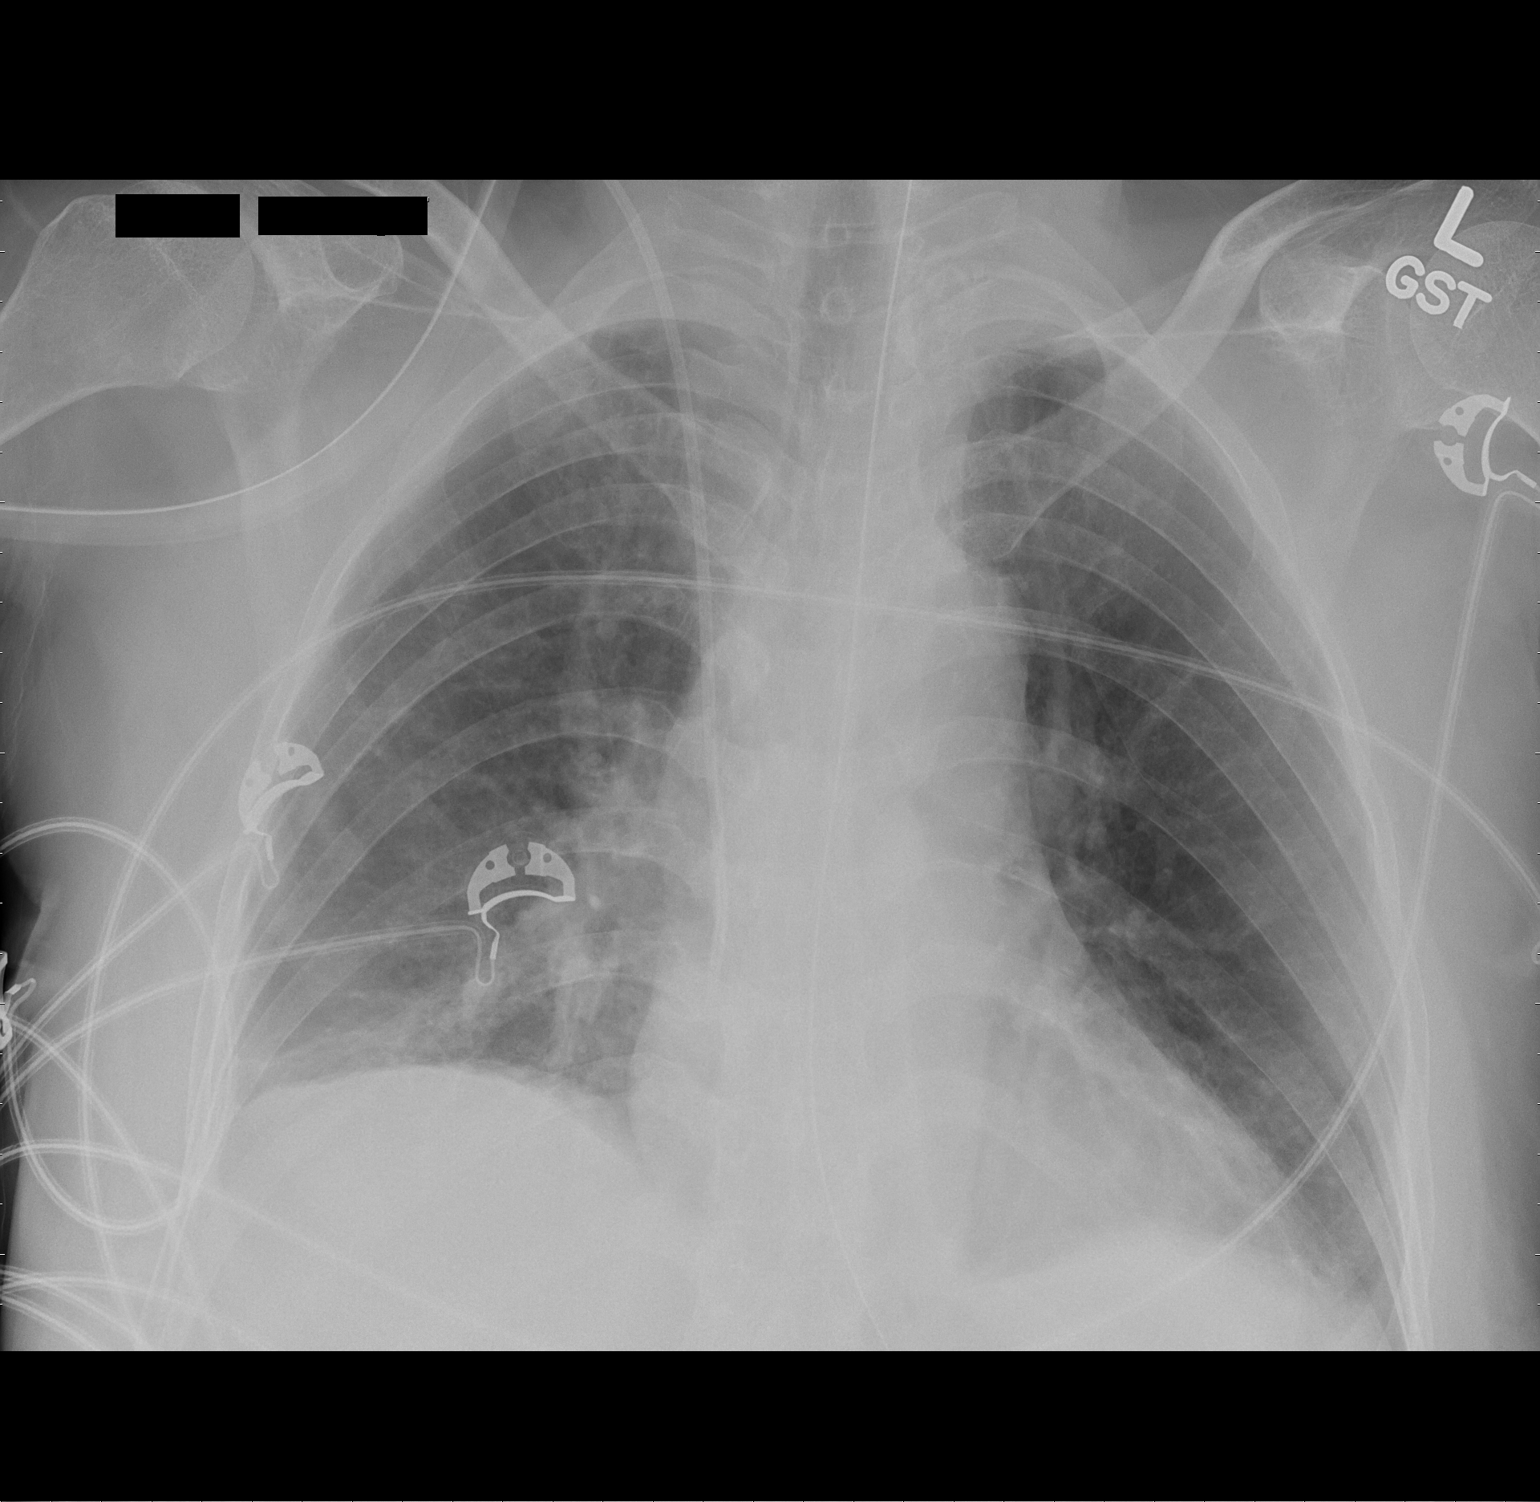

[1 of 1 positions shown; findings below may reference images not displayed]

FINDINGS: Right jugular central venous catheter tip in the lower
SVC.  Nasogastric tube courses below the diaphragm into the
stomach.  Cardiac silhouette upper normal in size to slightly
enlarged for technique.  Pulmonary vascularity normal without
evidence of pulmonary edema.  Mild atelectasis in the lung bases,
right greater than left, unchanged.  No new pulmonary parenchymal
abnormalities.  Stable mild elevation of the right hemidiaphragm.
IMPRESSION: Support apparatus satisfactory.  Mild bibasilar atelectasis, right
greater than left, unchanged.  No new abnormalities.

## 2012-09-13 ENCOUNTER — Emergency Department (HOSPITAL_COMMUNITY)
Admission: EM | Admit: 2012-09-13 | Discharge: 2012-09-13 | Disposition: A | Discharge status: Home / Self Care | Payer: Medicaid Other | Attending: Emergency Medicine | Admitting: Emergency Medicine

## 2012-09-13 ENCOUNTER — Encounter (HOSPITAL_COMMUNITY): Payer: Self-pay | Admitting: *Deleted

## 2012-09-13 DIAGNOSIS — R5383 Other fatigue: Secondary | ICD-10-CM | POA: Insufficient documentation

## 2012-09-13 DIAGNOSIS — Z8679 Personal history of other diseases of the circulatory system: Secondary | ICD-10-CM | POA: Insufficient documentation

## 2012-09-13 DIAGNOSIS — J45909 Unspecified asthma, uncomplicated: Secondary | ICD-10-CM | POA: Insufficient documentation

## 2012-09-13 DIAGNOSIS — Z933 Colostomy status: Secondary | ICD-10-CM

## 2012-09-13 DIAGNOSIS — IMO0002 Reserved for concepts with insufficient information to code with codable children: Secondary | ICD-10-CM | POA: Insufficient documentation

## 2012-09-13 DIAGNOSIS — R5381 Other malaise: Secondary | ICD-10-CM | POA: Insufficient documentation

## 2012-09-13 NOTE — ED Notes (Signed)
Wound/ostomy consult complete. Patient's son was called to pick up patient for discharge.

## 2012-09-13 NOTE — Discharge Instructions (Signed)
The [facility] Code number for your colostomy bags is 651.  The [facility] for the clamp used at the bottom of the bad is 641.  The telephone number for [facility] is [phone].  You have to speak to [facility], whose extension is 1945. They supply your colostomy bags.

## 2012-09-13 NOTE — ED Notes (Signed)
Waiting for [facility] nurse.  Colostomy supplies at bedside.

## 2012-09-13 NOTE — ED Notes (Signed)
Pt is here with skin irritation around colostomy.  Pt needs colostomy bag and skin powder for irritation.  No abdominal pain, and no blood in bag

## 2012-09-13 NOTE — ED Provider Notes (Signed)
History  Scribed for Carleene Cooper III, MD, the patient was seen in room TR04C/TR04C. This chart was scribed by Candelaria Stagers. The patient's care started at 2:00 PM   CSN: [id number]  Arrival date & time 09/13/12  1204   None     Chief Complaint  Patient presents with  . colostomy skin breakdown-- needs powder     The history is provided by the patient. A language interpreter was used.   [name] is a 71 y.o. female who presents to the Emergency Department complaining of skin irritation around colostomy bag that was permanently put in place after complication from colon surgery.    Past Medical History  Diagnosis Date  . Asthma   . Chest pain     Past Surgical History  Procedure Date  . Colostomy   . Colon surgery     No family history on file.  History  Substance Use Topics  . Smoking status: Never Smoker   . Smokeless tobacco: Not on file  . Alcohol Use: No    OB History    Grav Para Term Preterm Abortions TAB SAB Ect Mult Living                  Review of Systems  Constitutional: Positive for fatigue.  Gastrointestinal:       Colostomy skin irritation  All other systems reviewed and are negative.    Allergies  Review of patient's allergies indicates no known allergies.  Home Medications  No current outpatient prescriptions on file.  BP 142/71  Pulse 79  Temp 98.7 F (37.1 C) (Oral)  Resp 16  SpO2 97%  Physical Exam  Nursing note and vitals reviewed. Constitutional: She is oriented to person, place, and time. She appears well-developed and well-nourished. No distress.  HENT:  Head: Normocephalic and atraumatic.  Eyes: Conjunctivae normal and EOM are normal.  Neck: Neck supple. No tracheal deviation present.  Cardiovascular: Normal rate.   Pulmonary/Chest: Effort normal. No respiratory distress.  Abdominal: She exhibits no distension.       Colostomy bag in place.      Musculoskeletal: Normal range of motion.  Neurological: She is  alert and oriented to person, place, and time. No sensory deficit.  Skin: Skin is dry.  Psychiatric: She has a normal mood and affect. Her behavior is normal.    ED Course  Procedures   DIAGNOSTIC STUDIES: Oxygen Saturation is 97% on room air, normal by my interpretation.    COORDINATION OF CARE:  1:58PM Spoke with ostomy nurse.   2:15 PM Will call [facility].   Pt's condition discussed with her via [employer].  I spoke to the ostomy nurse, and got code numbers for her colostomy.  Call to [facility], who have not supplied her with ostomy bags.  Call to [facility], who supply people with colostomy bags, but they do not supply her with ostomy bags.  Will have ostomy nurse to try to facilitate home delivery of her ostomy bags.    1. Colostomy in place     I personally performed the services described in this documentation, which was scribed in my presence. The recorded information has been reviewed and considered.  Osvaldo Human, M.D.     Carleene Cooper III, MD 09/13/12 1430

## 2012-09-13 NOTE — Consult Note (Signed)
WOC ostomy consult  Pt well known to the [facility] department, she comes to ER every few months with need for supplies. Last visit was Aug. Where I personally set her up with medical supplier. She is not followed by [facility].  I have spoken with [facility] today, they are her medical supplier.  Ollen Gross is her case Production designer, theatre/television/film at [facility] and reports they have received supplies in Aug. When I initially set her up and then they called in September but they did not call for order the end of October.  She has [place] [facility] and it is required that the pt or pts family call each month to reorder supplies.  I have provided the pt and spoken to her via [place] interpreter today to make sure she is clear on this.  I have provider her with [facility] surgical's number and the case manager's number.  I have contacted the case manager and she is scheduled to have her supplies arrive at her home by Thursday this week. The ER has ordered her 3 ostomy pouches for her to take home today in the interim of her shipment arriving. She has one pouch left, she is using 1pc flat precut pouch for LLQ ileotomy.  I have verified with interpreter that she understands she must have her family or her call each month for supplies (now around the 5th of each month).  MD aware of this as well, will dc to home with son.  Bedside nurse to contact son to pick her up.  I have stressed again importance of her calling for supplies.  Fawnda Vitullo Aldrich RN,CWOCN [phone]

## 2012-12-16 ENCOUNTER — Emergency Department (HOSPITAL_COMMUNITY): Payer: Medicaid Other

## 2012-12-16 ENCOUNTER — Emergency Department (HOSPITAL_COMMUNITY)
Admission: EM | Admit: 2012-12-16 | Discharge: 2012-12-16 | Disposition: A | Discharge status: Home / Self Care | Payer: Medicaid Other | Attending: Emergency Medicine | Admitting: Emergency Medicine

## 2012-12-16 DIAGNOSIS — Y9389 Activity, other specified: Secondary | ICD-10-CM | POA: Insufficient documentation

## 2012-12-16 DIAGNOSIS — S99929A Unspecified injury of unspecified foot, initial encounter: Secondary | ICD-10-CM | POA: Insufficient documentation

## 2012-12-16 DIAGNOSIS — J45909 Unspecified asthma, uncomplicated: Secondary | ICD-10-CM | POA: Insufficient documentation

## 2012-12-16 DIAGNOSIS — S8992XA Unspecified injury of left lower leg, initial encounter: Secondary | ICD-10-CM

## 2012-12-16 DIAGNOSIS — Y929 Unspecified place or not applicable: Secondary | ICD-10-CM | POA: Insufficient documentation

## 2012-12-16 DIAGNOSIS — W1809XA Striking against other object with subsequent fall, initial encounter: Secondary | ICD-10-CM | POA: Insufficient documentation

## 2012-12-16 DIAGNOSIS — S8990XA Unspecified injury of unspecified lower leg, initial encounter: Secondary | ICD-10-CM | POA: Insufficient documentation

## 2012-12-16 MED ORDER — ACETAMINOPHEN 500 MG PO TABS
1000.0000 mg | ORAL_TABLET | Freq: Once | ORAL | Status: AC
  Administered 2012-12-16: 1000 mg via ORAL
  Filled 2012-12-16: qty 2

## 2012-12-16 MED ORDER — ACETAMINOPHEN 500 MG PO TABS: 500.0000 mg | ORAL_TABLET | Freq: Four times a day (QID) | ORAL | Status: DC | PRN

## 2012-12-16 NOTE — ED Provider Notes (Signed)
Medical screening examination/treatment/procedure(s) were performed by non-physician practitioner and as supervising physician I was immediately available for consultation/collaboration.   Charles B. Sheldon, MD 12/16/12 2238 

## 2012-12-16 NOTE — ED Provider Notes (Signed)
History     CSN: [id number]  Arrival date & time 12/16/12  0814   First MD Initiated Contact with Patient 12/16/12 [phone]      Chief Complaint  Patient presents with  . Knee Injury    (Consider location/radiation/quality/duration/timing/severity/associated sxs/prior treatment) HPI Comments: Patient is a 72 year old female who presents with a 3 day history of left knee pain. The pain started after she hit her knee on the laundry basket. Patient reports progressive worsening of pain. Left knee movement and weight bearing activity make the pain worse. Nothing makes the pain better. Patient reports associated swelling. Patient has not tried anything for pain relief. Patient denies obvious deformity, numbness/tingling, coolness/weakness of extremity, bruising, and any other injury.      Past Medical History  Diagnosis Date  . Asthma   . Chest pain     Past Surgical History  Procedure Date  . Colostomy   . Colon surgery     No family history on file.  History  Substance Use Topics  . Smoking status: Never Smoker   . Smokeless tobacco: Not on file  . Alcohol Use: No    OB History    Grav Para Term Preterm Abortions TAB SAB Ect Mult Living                  Review of Systems  Musculoskeletal: Positive for arthralgias.  All other systems reviewed and are negative.    Allergies  Review of patient's allergies indicates no known allergies.  Home Medications  No current outpatient prescriptions on file.  BP 168/62  Pulse 84  Temp 97.8 F (36.6 C) (Oral)  SpO2 100%  Physical Exam  Nursing note and vitals reviewed. Constitutional: She is oriented to person, place, and time. She appears well-developed and well-nourished. No distress.  HENT:  Head: Normocephalic and atraumatic.  Eyes: Conjunctivae normal are normal.  Neck: Normal range of motion.  Cardiovascular: Normal rate, regular rhythm and intact distal pulses.  Exam reveals no gallop and no friction rub.   No  murmur heard. Pulmonary/Chest: Effort normal and breath sounds normal. She has no wheezes. She has no rales. She exhibits no tenderness.  Abdominal: Soft. She exhibits no distension.  Musculoskeletal: Normal range of motion.       Anterior left knee mildly tender to palpation. No edema or obvious deformity. No bruising. Full ROM.   Neurological: She is alert and oriented to person, place, and time. Coordination normal.       Speech is goal-oriented. Moves limbs without ataxia.   Skin: Skin is warm and dry.  Psychiatric: She has a normal mood and affect. Her behavior is normal.    ED Course  Procedures (including critical care time)  Labs Reviewed - No data to display Dg Knee Complete 4 Views Left  12/16/2012  *RADIOLOGY REPORT*  Clinical Data: Knee injury, pain throughout the entire knee  LEFT KNEE - COMPLETE 4+ VIEW  Comparison: None  Findings: Osseous demineralization. Minimal joint space narrowing. No acute fracture, dislocation, or bone destruction. No knee joint effusion or regional soft tissue abnormalities.  IMPRESSION: Osseous demineralization. No acute abnormalities.   Original Report Authenticated By: Ulyses Southward, M.D.      1. Left knee injury       MDM  8:51 AM Patient will have tylenol for pain and left knee xray. No neurovascular compromise.   9:52 AM Xray unremarkable. Patient will be discharged with tylenol for pain. No further evaluation needed at  this time. Pulses intact and equal bilaterally.     Emilia Beck, PA-C 12/16/12 1551

## 2012-12-16 NOTE — ED Notes (Signed)
Pt hit her left knee on a laundry basket 3 days ago and has had pain since.

## 2012-12-16 NOTE — ED Notes (Signed)
Pt speaks Nepali

## 2012-12-16 NOTE — ED Notes (Signed)
Pt is non-english speaking, interpreter line used.  PT c/o falling and injuring left knee.

## 2013-01-06 ENCOUNTER — Encounter (HOSPITAL_COMMUNITY): Payer: Self-pay | Admitting: Emergency Medicine

## 2013-01-06 ENCOUNTER — Emergency Department (HOSPITAL_COMMUNITY)
Admission: EM | Admit: 2013-01-06 | Discharge: 2013-01-06 | Disposition: A | Discharge status: Home / Self Care | Payer: Medicaid Other | Attending: Emergency Medicine | Admitting: Emergency Medicine

## 2013-01-06 DIAGNOSIS — J45909 Unspecified asthma, uncomplicated: Secondary | ICD-10-CM | POA: Insufficient documentation

## 2013-01-06 DIAGNOSIS — R109 Unspecified abdominal pain: Secondary | ICD-10-CM | POA: Insufficient documentation

## 2013-01-06 DIAGNOSIS — Z760 Encounter for issue of repeat prescription: Secondary | ICD-10-CM

## 2013-01-06 LAB — URINE MICROSCOPIC-ADD ON

## 2013-01-06 LAB — COMPREHENSIVE METABOLIC PANEL
ALT: 22 U/L (ref 0–35)
AST: 32 U/L (ref 0–37)
Albumin: 3.8 g/dL (ref 3.5–5.2)
Calcium: 9.3 mg/dL (ref 8.4–10.5)
Chloride: 107 mEq/L (ref 96–112)
Creatinine, Ser: 1.02 mg/dL (ref 0.50–1.10)
Sodium: 139 mEq/L (ref 135–145)

## 2013-01-06 LAB — CBC WITH DIFFERENTIAL/PLATELET
Basophils Absolute: 0.1 10*3/uL (ref 0.0–0.1)
Basophils Relative: 1 % (ref 0–1)
Eosinophils Relative: 4 % (ref 0–5)
HCT: 36.2 % (ref 36.0–46.0)
Lymphocytes Relative: 35 % (ref 12–46)
MCHC: 32.6 g/dL (ref 30.0–36.0)
Monocytes Absolute: 0.4 10*3/uL (ref 0.1–1.0)
Neutro Abs: 3.2 10*3/uL (ref 1.7–7.7)
Platelets: 221 10*3/uL (ref 150–400)
RDW: 14.1 % (ref 11.5–15.5)
WBC: 6.1 10*3/uL (ref 4.0–10.5)

## 2013-01-06 LAB — URINALYSIS, ROUTINE W REFLEX MICROSCOPIC
Glucose, UA: NEGATIVE mg/dL
pH: 5.5 (ref 5.0–8.0)

## 2013-01-06 NOTE — ED Notes (Signed)
Pt c/o generalized abd pain x several days near old sx site; pt speaks little english and family at bedside speaks english

## 2013-01-07 NOTE — ED Provider Notes (Signed)
History     CSN: [id number]  Arrival date & time 01/06/13  1608   First MD Initiated Contact with Patient 01/06/13 2125      Chief Complaint  Patient presents with  . Abdominal Pain    (Consider location/radiation/quality/duration/timing/severity/associated sxs/prior treatment) HPI Comments: 72 y.o.female in no acute distress presenting for ostomy bags and she is out. Apparently she has done this many times in the past over the last year. She admits itching at the site. Despite triage note stating abdominal pain, through use of the translator service (pt speaks only [place]), confirmed several times that she absolutely denies any pain and her sole purpose for being here is to get new bags.  Denies any pain, any fever, no nausea vomiting, weakness. She has no primary care provider and claims she does not know how to go about getting new bags, though a review of notes dating back to a over a year ago (December 09, 2011) shows she has been educated several times. Her son is with her today who does speak some English and helps care for his mother.    Patient is a 72 y.o. female presenting with abdominal pain.  Abdominal Pain Associated symptoms: no chest pain, no dysuria, no fever, no nausea, no shortness of breath and no vomiting     Past Medical History  Diagnosis Date  . Asthma   . Chest pain     Past Surgical History  Procedure Laterality Date  . Colostomy    . Colon surgery      History reviewed. No pertinent family history.  History  Substance Use Topics  . Smoking status: Never Smoker   . Smokeless tobacco: Not on file  . Alcohol Use: No    OB History   Grav Para Term Preterm Abortions TAB SAB Ect Mult Living                  Review of Systems  Constitutional: Negative for fever and diaphoresis.  HENT: Negative for neck pain and neck stiffness.   Eyes: Negative for visual disturbance.  Respiratory: Negative for apnea, chest tightness and shortness of breath.    Cardiovascular: Negative for chest pain and palpitations.  Gastrointestinal: Negative for nausea, vomiting and abdominal pain.  Genitourinary: Negative for dysuria.  Musculoskeletal: Negative for gait problem.  Skin: Negative for rash.  Neurological: Negative for dizziness, weakness, light-headedness, numbness and headaches.    Allergies  Review of patient's allergies indicates no known allergies.  Home Medications  No current outpatient prescriptions on file.  BP 139/84  Pulse 78  Temp(Src) 98.4 F (36.9 C) (Oral)  Resp 16  SpO2 98%  Physical Exam  Nursing note and vitals reviewed. Constitutional: She is oriented to person, place, and time. She appears well-developed and well-nourished. No distress.  HENT:  Head: Normocephalic and atraumatic.  Eyes: EOM are normal. Pupils are equal, round, and reactive to light.  Neck: Normal range of motion. Neck supple.  No meningeal signs  Cardiovascular: Normal rate, regular rhythm and normal heart sounds.  Exam reveals no gallop and no friction rub.   No murmur heard. Pulmonary/Chest: Effort normal and breath sounds normal. No respiratory distress. She has no wheezes. She has no rales. She exhibits no tenderness.  Abdominal: Soft. She exhibits no distension. There is no tenderness. There is no rebound and no guarding.  No skin breakdown ostomy site. No signs of infection: Erythema, tenderness, discharge. Mucosa pink, Stool flowing from ostomy.  Musculoskeletal: Normal range of  motion. She exhibits no edema and no tenderness.  Neurological: She is alert and oriented to person, place, and time. No cranial nerve deficit.  Skin: Skin is warm and dry. She is not diaphoretic. No erythema.    ED Course  Procedures (including critical care time)  Labs Reviewed  CBC WITH DIFFERENTIAL - Abnormal; Notable for the following:    Hemoglobin 11.8 (*)    All other components within normal limits  COMPREHENSIVE METABOLIC PANEL - Abnormal; Notable  for the following:    Glucose, Bld 139 (*)    Alkaline Phosphatase 181 (*)    GFR calc non Af Amer 54 (*)    GFR calc Af Amer 63 (*)    All other components within normal limits  LIPASE, BLOOD - Abnormal; Notable for the following:    Lipase 76 (*)    All other components within normal limits  URINALYSIS, ROUTINE W REFLEX MICROSCOPIC - Abnormal; Notable for the following:    APPearance CLOUDY (*)    Hgb urine dipstick SMALL (*)    Leukocytes, UA LARGE (*)    All other components within normal limits  URINE MICROSCOPIC-ADD ON - Abnormal; Notable for the following:    Bacteria, UA FEW (*)    Casts HYALINE CASTS (*)    All other components within normal limits  URINE CULTURE   No results found.   1. Medication refill       MDM  An apparent miscommunication in triage listed chief complaint as abdominal pain. Through use of the interpreter service (pt speaks only Nepali) was able to confirm several times that the pt is not in any pain, any distress, and here only for ostomy bags. She claims not to know how to get more bags, but review of her previous notes shows she has been here several times over the last year and has been educated each time. Provided hand-written script for ostomy bag refill and contact information to [facility]. Emphasized importance of establishing relationship with a doctor who could provide her a means to keep her supply current. Via translator, pt and her son expressed understanding, stating they would call the numbers I provided. Also discussed lab findings with pt and impressed upon her and her son to discuss them with a primary care doctor (resource list provided). At this time there does not appear to be any evidence of an acute emergency medical condition and the patient appears stable for discharge with appropriate outpatient follow up.Diagnosis was discussed with patient who verbalizes understanding and is agreeable to discharge. Pt case discussed with Dr. Radford Pax  who agrees with my plan.    Glade Nurse, PA-C 01/07/13 1104

## 2013-01-08 LAB — URINE CULTURE

## 2013-01-08 NOTE — ED Provider Notes (Signed)
Medical screening examination/treatment/procedure(s) were performed by non-physician practitioner and as supervising physician I was immediately available for consultation/collaboration.    Phylliss Strege L Runa Whittingham, MD 01/08/13 1511 

## 2013-01-20 ENCOUNTER — Telehealth (INDEPENDENT_AMBULATORY_CARE_PROVIDER_SITE_OTHER): Payer: Self-pay | Admitting: *Deleted

## 2013-01-20 NOTE — Telephone Encounter (Signed)
Family member called to state he has been unable to get colostomy supplies at [facility].  This RN spoke to Wisner at [employer] who stated she believes this is the family that came to get supplies but didn't have money to pay for them.  The family thought that they would be given the supplies.  Olegario Messier said she is more than happy to help the family if they want to come today but they will have to pay for the supplies.  Spoke to family who states he is going to their office right now.  There is a language barrier but this RN believes he understood.

## 2013-07-18 ENCOUNTER — Telehealth (INDEPENDENT_AMBULATORY_CARE_PROVIDER_SITE_OTHER): Payer: Self-pay

## 2013-07-18 NOTE — Telephone Encounter (Signed)
Fax or ostomy supplies faxed to [phone]. Copy of RX sent to get scanned in chart.

## 2013-11-04 ENCOUNTER — Emergency Department (HOSPITAL_COMMUNITY): Payer: Medicaid Other

## 2013-11-04 ENCOUNTER — Encounter (HOSPITAL_COMMUNITY): Payer: Self-pay | Admitting: Emergency Medicine

## 2013-11-04 ENCOUNTER — Inpatient Hospital Stay (HOSPITAL_COMMUNITY)
Admission: EM | Admit: 2013-11-04 | Discharge: 2013-11-12 | DRG: 388 | Disposition: A | Discharge status: Home / Self Care | Payer: Medicaid Other | Attending: Family Medicine | Admitting: Family Medicine

## 2013-11-04 DIAGNOSIS — K56609 Unspecified intestinal obstruction, unspecified as to partial versus complete obstruction: Secondary | ICD-10-CM

## 2013-11-04 DIAGNOSIS — J449 Chronic obstructive pulmonary disease, unspecified: Secondary | ICD-10-CM | POA: Diagnosis present

## 2013-11-04 DIAGNOSIS — I2589 Other forms of chronic ischemic heart disease: Secondary | ICD-10-CM | POA: Diagnosis present

## 2013-11-04 DIAGNOSIS — I252 Old myocardial infarction: Secondary | ICD-10-CM

## 2013-11-04 DIAGNOSIS — K458 Other specified abdominal hernia without obstruction or gangrene: Secondary | ICD-10-CM

## 2013-11-04 DIAGNOSIS — J4489 Other specified chronic obstructive pulmonary disease: Secondary | ICD-10-CM | POA: Diagnosis present

## 2013-11-04 DIAGNOSIS — I10 Essential (primary) hypertension: Secondary | ICD-10-CM

## 2013-11-04 DIAGNOSIS — K922 Gastrointestinal hemorrhage, unspecified: Secondary | ICD-10-CM

## 2013-11-04 DIAGNOSIS — I4891 Unspecified atrial fibrillation: Secondary | ICD-10-CM

## 2013-11-04 DIAGNOSIS — K56600 Partial intestinal obstruction, unspecified as to cause: Secondary | ICD-10-CM

## 2013-11-04 DIAGNOSIS — J45909 Unspecified asthma, uncomplicated: Secondary | ICD-10-CM

## 2013-11-04 DIAGNOSIS — N179 Acute kidney failure, unspecified: Secondary | ICD-10-CM | POA: Diagnosis present

## 2013-11-04 DIAGNOSIS — Z932 Ileostomy status: Secondary | ICD-10-CM

## 2013-11-04 DIAGNOSIS — K5669 Other intestinal obstruction: Principal | ICD-10-CM | POA: Diagnosis present

## 2013-11-04 DIAGNOSIS — K250 Acute gastric ulcer with hemorrhage: Secondary | ICD-10-CM

## 2013-11-04 DIAGNOSIS — D62 Acute posthemorrhagic anemia: Secondary | ICD-10-CM | POA: Diagnosis not present

## 2013-11-04 DIAGNOSIS — D649 Anemia, unspecified: Secondary | ICD-10-CM

## 2013-11-04 DIAGNOSIS — I129 Hypertensive chronic kidney disease with stage 1 through stage 4 chronic kidney disease, or unspecified chronic kidney disease: Secondary | ICD-10-CM | POA: Diagnosis present

## 2013-11-04 DIAGNOSIS — K254 Chronic or unspecified gastric ulcer with hemorrhage: Secondary | ICD-10-CM | POA: Diagnosis present

## 2013-11-04 DIAGNOSIS — N183 Chronic kidney disease, stage 3 unspecified: Secondary | ICD-10-CM | POA: Diagnosis present

## 2013-11-04 DIAGNOSIS — I2510 Atherosclerotic heart disease of native coronary artery without angina pectoris: Secondary | ICD-10-CM | POA: Diagnosis present

## 2013-11-04 DIAGNOSIS — E785 Hyperlipidemia, unspecified: Secondary | ICD-10-CM | POA: Diagnosis present

## 2013-11-04 DIAGNOSIS — E871 Hypo-osmolality and hyponatremia: Secondary | ICD-10-CM | POA: Diagnosis present

## 2013-11-04 DIAGNOSIS — R109 Unspecified abdominal pain: Secondary | ICD-10-CM

## 2013-11-04 HISTORY — DX: Acute gastric ulcer with hemorrhage: K25.0

## 2013-11-04 HISTORY — DX: Paroxysmal atrial fibrillation: I48.0

## 2013-11-04 HISTORY — DX: Non-ST elevation (NSTEMI) myocardial infarction: I21.4

## 2013-11-04 LAB — CBC WITH DIFFERENTIAL/PLATELET
Basophils Absolute: 0 10*3/uL (ref 0.0–0.1)
Eosinophils Absolute: 0 10*3/uL (ref 0.0–0.7)
Eosinophils Relative: 0 % (ref 0–5)
HCT: 41 % (ref 36.0–46.0)
MCH: 28.1 pg (ref 26.0–34.0)
MCHC: 34.1 g/dL (ref 30.0–36.0)
MCV: 82.2 fL (ref 78.0–100.0)
Monocytes Absolute: 0.3 10*3/uL (ref 0.1–1.0)
Platelets: 272 10*3/uL (ref 150–400)
RDW: 12.9 % (ref 11.5–15.5)

## 2013-11-04 LAB — COMPREHENSIVE METABOLIC PANEL
ALT: 20 U/L (ref 0–35)
AST: 33 U/L (ref 0–37)
Calcium: 9.6 mg/dL (ref 8.4–10.5)
Creatinine, Ser: 1.16 mg/dL — ABNORMAL HIGH (ref 0.50–1.10)
GFR calc Af Amer: 54 mL/min — ABNORMAL LOW (ref >90)
Sodium: 131 mEq/L — ABNORMAL LOW (ref 135–145)
Total Protein: 9 g/dL — ABNORMAL HIGH (ref 6.0–8.3)

## 2013-11-04 LAB — URINALYSIS, ROUTINE W REFLEX MICROSCOPIC
Bilirubin Urine: NEGATIVE
Nitrite: NEGATIVE
Specific Gravity, Urine: 1.02 (ref 1.005–1.030)
Urobilinogen, UA: 0.2 mg/dL (ref 0.0–1.0)
pH: 5 (ref 5.0–8.0)

## 2013-11-04 LAB — TROPONIN I
Troponin I: <0.3 ng/mL (ref <0.30)
Troponin I: <0.3 ng/mL (ref <0.30)

## 2013-11-04 LAB — URINE MICROSCOPIC-ADD ON

## 2013-11-04 MED ORDER — IOHEXOL 300 MG/ML  SOLN
100.0000 mL | Freq: Once | INTRAMUSCULAR | Status: AC | PRN
  Administered 2013-11-04: 100 mL via INTRAVENOUS

## 2013-11-04 MED ORDER — ENALAPRIL MALEATE 5 MG PO TABS
5.0000 mg | ORAL_TABLET | Freq: Two times a day (BID) | ORAL | Status: DC
  Administered 2013-11-04 – 2013-11-10 (×10): 5 mg via ORAL
  Filled 2013-11-04 (×15): qty 1

## 2013-11-04 MED ORDER — CLOPIDOGREL BISULFATE 75 MG PO TABS
75.0000 mg | ORAL_TABLET | Freq: Every day | ORAL | Status: DC
  Administered 2013-11-04: 75 mg via ORAL
  Filled 2013-11-04: qty 1

## 2013-11-04 MED ORDER — ENOXAPARIN SODIUM 40 MG/0.4ML ~~LOC~~ SOLN
40.0000 mg | SUBCUTANEOUS | Status: DC
  Administered 2013-11-04: 40 mg via SUBCUTANEOUS
  Filled 2013-11-04 (×2): qty 0.4

## 2013-11-04 MED ORDER — MORPHINE SULFATE 4 MG/ML IJ SOLN
4.0000 mg | Freq: Once | INTRAMUSCULAR | Status: AC
  Administered 2013-11-04: 4 mg via INTRAVENOUS
  Filled 2013-11-04: qty 1

## 2013-11-04 MED ORDER — METOPROLOL TARTRATE 25 MG PO TABS
25.0000 mg | ORAL_TABLET | Freq: Two times a day (BID) | ORAL | Status: DC
  Administered 2013-11-04 – 2013-11-10 (×10): 25 mg via ORAL
  Filled 2013-11-04 (×14): qty 1

## 2013-11-04 MED ORDER — AMLODIPINE BESYLATE 5 MG PO TABS
5.0000 mg | ORAL_TABLET | Freq: Every day | ORAL | Status: DC
  Administered 2013-11-04 – 2013-11-10 (×6): 5 mg via ORAL
  Filled 2013-11-04 (×8): qty 1

## 2013-11-04 MED ORDER — ONDANSETRON HCL 4 MG PO TABS
4.0000 mg | ORAL_TABLET | Freq: Four times a day (QID) | ORAL | Status: DC | PRN
  Administered 2013-11-10: 4 mg via ORAL
  Filled 2013-11-04: qty 1

## 2013-11-04 MED ORDER — ONDANSETRON HCL 4 MG/2ML IJ SOLN
4.0000 mg | Freq: Four times a day (QID) | INTRAMUSCULAR | Status: DC | PRN
  Administered 2013-11-05 – 2013-11-08 (×5): 4 mg via INTRAVENOUS
  Filled 2013-11-04 (×5): qty 2

## 2013-11-04 MED ORDER — POTASSIUM CHLORIDE IN NACL 20-0.9 MEQ/L-% IV SOLN
INTRAVENOUS | Status: DC
  Administered 2013-11-04 – 2013-11-06 (×3): via INTRAVENOUS
  Filled 2013-11-04 (×5): qty 1000

## 2013-11-04 MED ORDER — SIMVASTATIN 20 MG PO TABS
20.0000 mg | ORAL_TABLET | Freq: Every morning | ORAL | Status: DC
  Administered 2013-11-05 – 2013-11-12 (×7): 20 mg via ORAL
  Filled 2013-11-04 (×8): qty 1

## 2013-11-04 MED ORDER — MOMETASONE FURO-FORMOTEROL FUM 100-5 MCG/ACT IN AERO
2.0000 | INHALATION_SPRAY | Freq: Two times a day (BID) | RESPIRATORY_TRACT | Status: DC
  Administered 2013-11-04 – 2013-11-12 (×15): 2 via RESPIRATORY_TRACT
  Filled 2013-11-04: qty 8.8

## 2013-11-04 MED ORDER — ONDANSETRON HCL 4 MG/2ML IJ SOLN
4.0000 mg | Freq: Once | INTRAMUSCULAR | Status: AC
  Administered 2013-11-04: 4 mg via INTRAVENOUS
  Filled 2013-11-04: qty 2

## 2013-11-04 MED ORDER — MORPHINE SULFATE 2 MG/ML IJ SOLN
2.0000 mg | INTRAMUSCULAR | Status: DC | PRN
  Administered 2013-11-05: 2 mg via INTRAVENOUS
  Filled 2013-11-04: qty 1

## 2013-11-04 NOTE — Consult Note (Signed)
Seen, agree with above.    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  Pt thinks the hernia is new, although difficult to assess nuances of questions/answers via interpreter.    Will treat non operatively at this time.  Hopefully she will resolve.   Pt has hostile abdomen due to previous surgical issues.    Dr. Carolynne Edouard [facility]omy taken down.

## 2013-11-04 NOTE — Consult Note (Signed)
Mariadelcarmen Corella Zeiss Mar 15, 1941  [id number].    Requesting MD: Dr. Manus Gunning Chief Complaint/Reason for Consult: small bowel obstruction  HPI:  The history and physical were conducted with the use of a Nepalese interpretor.  72 y/o [place] female presented to [facility] with her son with complaints of abdominal pain around her ileostomy and nausea and vomiting which started yesterday (11/03/13).  She notes she hasn't been passing stool out of her ileostomy and she thinks its blocked.  Her pain was a severe sharp pain on arrival, but has since nearly resolved after IV pain medication.  She denies any precipitating or alleviating factors otherwise.  No radicular pain.  No other symptoms of note by the patient.    Of note this patient is very well know to our service after having a duodenum injury during an ERCP, the duodenal perforation was repaired at the same time as a lap chole.  She developed a leak from the repair.  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  She was last seen in our office by Dr. Carolynne Edouard on 11/24/11 and she was doing well at the time.   ROS: All systems reviewed and otherwise negative except for as above  No family history on file.  Past Medical History  Diagnosis Date  . Asthma   . Chest pain     Past Surgical History  Procedure Laterality Date  . Colostomy    . Colon surgery      Social History:  reports that she has never smoked. She does not have any smokeless tobacco history on file. She reports that she does not drink alcohol or use illicit drugs.  Allergies: No Known Allergies   (Not in a hospital admission)  Blood pressure 118/60, pulse 74, temperature 98.8 F (37.1 C), temperature source Oral, resp. rate 20, weight 94 lb (42.638 kg), SpO2 96.00%. Physical Exam: General: pleasant, WD/WN [place] female who is laying in bed in NAD HEENT: head is normocephalic,  atraumatic.  Sclera are noninjected.  PERRL.  Ears and nose without any masses or lesions.  Mouth is pink and moist Heart: regular, rate, and rhythm.  No obvious murmurs, gallops, or rubs noted.  Palpable pedal pulses bilaterally Lungs: CTAB, no wheezes, rhonchi, or rales noted.  Respiratory effort nonlabored Abd: soft, mild tenderness around the ileostomy in the left lower abdomen, +BS, large healed midline scar, 1-1.5cm ileostomy pink and with flatus in bag, no BM or drainage in bag, multiple bulges noted around the ostomy which could represent a parastomal hernia MS: all 4 extremities are symmetrical with no cyanosis, clubbing, or edema. Skin: warm and dry with no masses, lesions, or rashes Psych: A&Ox3 with an appropriate affect.  Results for orders placed during the hospital encounter of 11/04/13 (from the past 48 hour(s))  CBC WITH DIFFERENTIAL     Status: Abnormal   Collection Time    11/04/13  8:45 AM      Result Value Range   WBC 14.1 (*) 4.0 - 10.5 K/uL   RBC 4.99  3.87 - 5.11 MIL/uL   Hemoglobin 14.0  12.0 - 15.0 g/dL   HCT 78.2  95.6 - 21.3 %   MCV 82.2  78.0 - 100.0 fL   MCH 28.1  26.0 - 34.0 pg   MCHC 34.1  30.0 - 36.0 g/dL   RDW 08.6  57.8 - 46.9 %   Platelets 272  150 - 400 K/uL   Neutrophils Relative % 90 (*) 43 - 77 %   Neutro Abs 12.6 (*) 1.7 - 7.7 K/uL   Lymphocytes Relative 8 (*) 12 - 46 %   Lymphs Abs 1.1  0.7 - 4.0 K/uL   Monocytes Relative 2 (*) 3 - 12 %   Monocytes Absolute 0.3  0.1 - 1.0 K/uL   Eosinophils Relative 0  0 - 5 %   Eosinophils Absolute 0.0  0.0 - 0.7 K/uL   Basophils Relative 0  0 - 1 %   Basophils Absolute 0.0  0.0 - 0.1 K/uL  COMPREHENSIVE METABOLIC PANEL     Status: Abnormal   Collection Time    11/04/13  8:45 AM      Result Value Range   Sodium 131 (*) 135 - 145 mEq/L   Potassium 4.8  3.5 - 5.1 mEq/L   Chloride 100  96 - 112 mEq/L   CO2 19  19 - 32 mEq/L   Glucose, Bld 136 (*) 70 - 99 mg/dL   BUN 21  6 - 23 mg/dL   Creatinine, Ser  7.82 (*) 0.50 - 1.10 mg/dL   Calcium 9.6  8.4 - 95.6 mg/dL   Total Protein 9.0 (*) 6.0 - 8.3 g/dL   Albumin 4.2  3.5 - 5.2 g/dL   AST 33  0 - 37 U/L   ALT 20  0 - 35 U/L   Alkaline Phosphatase 180 (*) 39 - 117 U/L   Total Bilirubin 1.2  0.3 - 1.2 mg/dL   GFR calc non Af Amer 47 (*) >90 mL/min   GFR calc Af Amer 54 (*) >90 mL/min   Comment: (NOTE)     The eGFR has been calculated using the CKD EPI equation.     This calculation has not been validated in all clinical situations.     eGFR's persistently <90 mL/min signify possible Chronic Kidney     Disease.  URINALYSIS, ROUTINE W REFLEX MICROSCOPIC     Status: Abnormal   Collection Time    11/04/13 11:44 AM      Result Value Range   Color, Urine YELLOW  YELLOW   APPearance CLOUDY (*) CLEAR   Specific Gravity, Urine 1.020  1.005 - 1.030   pH 5.0  5.0 - 8.0   Glucose, UA NEGATIVE  NEGATIVE mg/dL   Hgb urine dipstick SMALL (*) NEGATIVE   Bilirubin Urine NEGATIVE  NEGATIVE   Ketones, ur NEGATIVE  NEGATIVE mg/dL   Protein, ur 30 (*) NEGATIVE mg/dL   Urobilinogen, UA 0.2  0.0 - 1.0 mg/dL   Nitrite NEGATIVE  NEGATIVE   Leukocytes, UA SMALL (*) NEGATIVE  URINE MICROSCOPIC-ADD ON     Status: Abnormal   Collection Time    11/04/13 11:44 AM      Result Value Range   Squamous Epithelial / LPF RARE  RARE   WBC, UA 7-10  <3 WBC/hpf   RBC / HPF 0-2  <3 RBC/hpf   Bacteria, UA FEW (*) RARE   Casts HYALINE CASTS (*) NEGATIVE   Comment: GRANULAR CAST  TROPONIN I     Status: None   Collection Time    11/04/13 11:55 AM      Result Value Range   Troponin I <0.30  <0.30 ng/mL   Comment:            Due to the release kinetics of cTnI,     a negative result within the first hours  of the onset of symptoms does not rule out     myocardial infarction with certainty.     If myocardial infarction is still suspected,     repeat the test at appropriate intervals.   Dg Chest 2 View  11/04/2013   CLINICAL DATA:  Upper abdominal discomfort and  vomiting and dyspnea  EXAM: CHEST  2 VIEW  COMPARISON:  PA and lateral chest x-ray of June 08, 2011  FINDINGS: The lungs are hyperinflated. There is no focal infiltrate. There is no pleural effusion or pneumothorax. The cardiopericardial silhouette is normal in size. The pulmonary vascularity is not engorged. The mediastinum is normal in width. The observed portions of the bony thorax exhibit no acute abnormalities.  IMPRESSION: There is mild hyperinflation consistent with COPD. There is no evidence of pneumonia nor CHF.   Electronically Signed   By: David  [place]   On: 11/04/2013 11:44   Ct Abdomen Pelvis W Contrast  11/04/2013   CLINICAL DATA:  Right lower quadrant pain, history of colostomy.  EXAM: CT ABDOMEN AND PELVIS WITH CONTRAST  TECHNIQUE: Multidetector CT imaging of the abdomen and pelvis was performed using the standard protocol following bolus administration of intravenous contrast.  CONTRAST:  OMNIPAQUE IOHEXOL 300 MG/ML SOLN intravenously, the patient also received oral contrast material.  COMPARISON:  Acute abdominal series of September 21, 2011 and CT scan of the abdomen and pelvis dated May 22, 2011.  FINDINGS: The liver exhibits no focal mass nor ductal dilation. The gallbladder is surgically absent. . There is mild dilation of the common bile duct into the head of the pancreas. The pancreas exhibits no focal mass nor ductal dilation. The spleen is normal in size and density. The stomach is distended with the orally administered contrast. There is a tiny hiatal hernia. There are no adrenal masses. The kidneys enhance well with no evidence of obstruction nor pyelonephritis. The caliber of the abdominal aorta is normal. There is no periaortic or pericaval lymphadenopathy.  There is mild distention of small-bowel loops down to just proximal to the ileostomy. On images 53 through 58 there is subjective mild wall thickening of the distal ileum which appears to be a relative transition zone  site. This may merely reflect the fact that contrast has not yet reached this portion of the colon. The unopacified stool filled portion of the distal ileum enters the colostomy in a normal fashion. There is mild herniation of a short segment of distal ileum into the ostomy site without evidence of obstruction.  The patient has apparently undergone right colectomy. The transverse and descending portions of the colon as well as the rectosigmoid are collapsed. The uterus and adnexal structures as well as the urinary bladder are grossly normal for age. There is no inguinal or significant umbilical hernia though there is deviation dehiscence of the abdominal wall musculature in the midline bringing the peritoneal surface close to the skin surface.  The lung bases are clear. The lumbar vertebral bodies are preserved in height.  IMPRESSION: 1. The patient has apparently undergone previous right colectomy. There has been creation of an ileostomy on the left. The small bowel exhibits mild distention of its lumen in its proximal 2/3 but there is a relative transition zone to non-opacified small bowel in the left mid active abdomen shortly proximal to the ileostomy. There is no evidence of an internal hernia. There is some herniation of normal-appearing distal ileum into the ostomy site. No intra-abdominal abscess is demonstrated. Contrast will likely  be entering the ostomy bag shortly. 2. The colon is collapsed and exhibits no acute abnormality. 3. There is no evidence of acute hepatobiliary abnormality nor acute urinary tract abnormality.   Electronically Signed   By: David  [place]   On: 11/04/2013 13:35       Assessment/Plan Partial small bowel obstruction Abdominal pain Nausea/vomiting Leukocytosis 14.1 Elevated creatinine - 1.16 Elevated alk phos - 180  Plan: 1.  NPO until Dr. Donell Beers can evaluate the patient.  Dr. Carolynne Edouard will likely see tomorrow. 2.  IVF, pain control, antiemetics, bowel rest 3.  Does not  seem fully obstructed as there is flatus in the bag and contrast does nearly reach her ileostomy.  Hopefully this will resolve on its own with conservative measures. 4.  Ambulate, IS, SCD's and dvt prophylaxis per primary 5. Will follow with you   Aris Georgia 11/04/2013, 3:23 PM Pager: [phone]

## 2013-11-04 NOTE — ED Provider Notes (Signed)
CSN: [id number]     Arrival date & time 11/04/13  0810 History   First MD Initiated Contact with Patient 11/04/13 [phone]     Chief Complaint  Patient presents with  . Abdominal Pain  . Emesis   (Consider location/radiation/quality/duration/timing/severity/associated sxs/prior Treatment) HPI  PCP: Alean Rinne [phone]) Surgeon: Dr. Carolynne Edouard  LANGUAGE: [place]- Patient a diffilicut interview due to language barrier. Phone interpretor was used.   PMH: 1. Ostomy- duodenal injury during an ERCP.  She then developed a leak from the repair. She also necrosed or right colon. She required a right colectomy and ileostomy.  2. cholecystectomy 3. asthma 4. Chest pain  Ptcomplaints of abdominal pain with vomiting that started yesterday. She says the ostomy has not been passing stool and she thinks she is blocked. Pain is 9/10. Denies fever, dysuria,vaginal discharge.   Past Medical History  Diagnosis Date  . Asthma   . Chest pain    Past Surgical History  Procedure Laterality Date  . Colostomy    . Colon surgery     No family history on file. History  Substance Use Topics  . Smoking status: Never Smoker   . Smokeless tobacco: Not on file  . Alcohol Use: No   OB History   Grav Para Term Preterm Abortions TAB SAB Ect Mult Living                 Review of Systems The patient denies anorexia, fever,chest pain, syncope, dyspnea on exertion, peripheral edema,hematuria, incontinence,unusual weight change, abnormal bleeding,   Allergies  Review of patient's allergies indicates no known allergies.  Home Medications   Current Outpatient Rx  Name  Route  Sig  Dispense  Refill  . amLODipine (NORVASC) 5 MG tablet   Oral   Take 5 mg by mouth daily.         . clopidogrel (PLAVIX) 75 MG tablet   Oral   Take 75 mg by mouth daily.         . enalapril (VASOTEC) 5 MG tablet   Oral   Take 5 mg by mouth 2 (two) times daily.         . Fluticasone-Salmeterol (ADVAIR)  250-50 MCG/DOSE AEPB   Inhalation   Inhale 1 puff into the lungs 2 (two) times daily.         . metoCLOPramide (REGLAN) 10 MG tablet   Oral   Take 10 mg by mouth at bedtime.         . metoprolol tartrate (LOPRESSOR) 25 MG tablet   Oral   Take 25 mg by mouth 2 (two) times daily.         . nitroGLYCERIN (NITROSTAT) 0.4 MG SL tablet   Sublingual   Place 0.4 mg under the tongue every 5 (five) minutes as needed for chest pain.         . simvastatin (ZOCOR) 20 MG tablet   Oral   Take 20 mg by mouth every morning.          BP 134/63  Pulse 74  Temp(Src) 98.8 F (37.1 C) (Oral)  Resp 23  Wt 94 lb (42.638 kg)  SpO2 97% Physical Exam  Nursing note and vitals reviewed. Constitutional: She appears well-developed and well-nourished. No distress.  HENT:  Head: Normocephalic and atraumatic.  Eyes: Pupils are equal, round, and reactive to light.  Neck: Normal range of motion. Neck supple.  Cardiovascular: Normal rate and regular rhythm.   Pulmonary/Chest: Effort normal.  Abdominal: Soft.  There is tenderness (diffuse). There is guarding. There is no rigidity, no rebound, no CVA tenderness, no tenderness at McBurney's point and negative Murphy's sign.    Neurological: She is alert.  Skin: Skin is warm and dry.    ED Course  Procedures (including critical care time) Labs Review Labs Reviewed  CBC WITH DIFFERENTIAL - Abnormal; Notable for the following:    WBC 14.1 (*)    Neutrophils Relative % 90 (*)    Neutro Abs 12.6 (*)    Lymphocytes Relative 8 (*)    Monocytes Relative 2 (*)    All other components within normal limits  COMPREHENSIVE METABOLIC PANEL - Abnormal; Notable for the following:    Sodium 131 (*)    Glucose, Bld 136 (*)    Creatinine, Ser 1.16 (*)    Total Protein 9.0 (*)    Alkaline Phosphatase 180 (*)    GFR calc non Af Amer 47 (*)    GFR calc Af Amer 54 (*)    All other components within normal limits  URINALYSIS, ROUTINE W REFLEX MICROSCOPIC -  Abnormal; Notable for the following:    APPearance CLOUDY (*)    Hgb urine dipstick SMALL (*)    Protein, ur 30 (*)    Leukocytes, UA SMALL (*)    All other components within normal limits  URINE MICROSCOPIC-ADD ON - Abnormal; Notable for the following:    Bacteria, UA FEW (*)    Casts HYALINE CASTS (*)    All other components within normal limits  URINE CULTURE  TROPONIN I   Imaging Review Dg Chest 2 View  11/04/2013   CLINICAL DATA:  Upper abdominal discomfort and vomiting and dyspnea  EXAM: CHEST  2 VIEW  COMPARISON:  PA and lateral chest x-ray of June 08, 2011  FINDINGS: The lungs are hyperinflated. There is no focal infiltrate. There is no pleural effusion or pneumothorax. The cardiopericardial silhouette is normal in size. The pulmonary vascularity is not engorged. The mediastinum is normal in width. The observed portions of the bony thorax exhibit no acute abnormalities.  IMPRESSION: There is mild hyperinflation consistent with COPD. There is no evidence of pneumonia nor CHF.   Electronically Signed   By: David  [place]   On: 11/04/2013 11:44   Ct Abdomen Pelvis W Contrast  11/04/2013   CLINICAL DATA:  Right lower quadrant pain, history of colostomy.  EXAM: CT ABDOMEN AND PELVIS WITH CONTRAST  TECHNIQUE: Multidetector CT imaging of the abdomen and pelvis was performed using the standard protocol following bolus administration of intravenous contrast.  CONTRAST:  OMNIPAQUE IOHEXOL 300 MG/ML SOLN intravenously, the patient also received oral contrast material.  COMPARISON:  Acute abdominal series of September 21, 2011 and CT scan of the abdomen and pelvis dated May 22, 2011.  FINDINGS: The liver exhibits no focal mass nor ductal dilation. The gallbladder is surgically absent. . There is mild dilation of the common bile duct into the head of the pancreas. The pancreas exhibits no focal mass nor ductal dilation. The spleen is normal in size and density. The stomach is distended with the  orally administered contrast. There is a tiny hiatal hernia. There are no adrenal masses. The kidneys enhance well with no evidence of obstruction nor pyelonephritis. The caliber of the abdominal aorta is normal. There is no periaortic or pericaval lymphadenopathy.  There is mild distention of small-bowel loops down to just proximal to the ileostomy. On images 53 through 58 there is subjective mild wall  thickening of the distal ileum which appears to be a relative transition zone site. This may merely reflect the fact that contrast has not yet reached this portion of the colon. The unopacified stool filled portion of the distal ileum enters the colostomy in a normal fashion. There is mild herniation of a short segment of distal ileum into the ostomy site without evidence of obstruction.  The patient has apparently undergone right colectomy. The transverse and descending portions of the colon as well as the rectosigmoid are collapsed. The uterus and adnexal structures as well as the urinary bladder are grossly normal for age. There is no inguinal or significant umbilical hernia though there is deviation dehiscence of the abdominal wall musculature in the midline bringing the peritoneal surface close to the skin surface.  The lung bases are clear. The lumbar vertebral bodies are preserved in height.  IMPRESSION: 1. The patient has apparently undergone previous right colectomy. There has been creation of an ileostomy on the left. The small bowel exhibits mild distention of its lumen in its proximal 2/3 but there is a relative transition zone to non-opacified small bowel in the left mid active abdomen shortly proximal to the ileostomy. There is no evidence of an internal hernia. There is some herniation of normal-appearing distal ileum into the ostomy site. No intra-abdominal abscess is demonstrated. Contrast will likely be entering the ostomy bag shortly. 2. The colon is collapsed and exhibits no acute abnormality. 3.  There is no evidence of acute hepatobiliary abnormality nor acute urinary tract abnormality.   Electronically Signed   By: David  [place]   On: 11/04/2013 13:35    EKG Interpretation    Date/Time:  Saturday November 04 2013 09:44:10 EST Ventricular Rate:  83 PR Interval:  122 QRS Duration: 88 QT Interval:  386 QTC Calculation: 453 R Axis:   67 Text Interpretation:  Sinus or ectopic atrial rhythm unchanged inferior ST elevation minimally Confirmed by RANCOUR  MD, STEPHEN ([id number]) on 11/04/2013 10:02:55 AM            MDM   1. Abdominal pain    Patient seen by Dr. Manus Gunning and myself, concern for bowel obstruction. Pain has been controlled with Morphine but she still has pain to palpation. CT scan of the abdomen shows a transition zone without frank bowel obstruction. Pt continues not to pass stool and very minimal contrast is seen in the her ileostomy bag.  I spoke with Dr. Donell Beers [facility]) who will consult on patient, she requests I admit to medicine.  Patient admitted to [facility], as an unassigned patient. FM has declined my offer for temp orders.  Dorthula Matas, PA-C 11/04/13 1432

## 2013-11-04 NOTE — H&P (Signed)
Family Medicine Teaching [facility] Admission History and Physical Service Pager: [phone]  Patient name: [name] Medical record number: [id number] Date of birth: 27-Aug-1941 Age: 72 y.o. Gender: female  Primary Care Provider: Siy-Hian, Whitman Hero, MD Consultants: General Surgery Code Status: Full  Chief Complaint: Abdominal Pain  Assessment and Plan: [name] is a 72 y.o. female presenting with abdominal pain, nausea, vomiting and decreased ostomy output. PMH is significant for partial colectomy with ileostomy in 2011, CAD, and chronic kidney disease.   # Abdomina Pain/Vausea/Vomiting - Decreased ostomy output likely due to partial obstruction - NPO - Place NG tube for decompression - IV zofran PRN - Morphine for pain - F/u surgery recs and appreciate all insight  # CAD s/p NSTEMI in 2011 - cardiac cath with multiple vessel disease; Trop neg x1 and EKG without evidence of ischemia on admission  - Cycle troponin given hx - Cont statin, beta blocker ACE-I and plavix  # Asthma - reported per patient - Cont home dulera, add albuterol PRN  # CKD III - Creat 1.16 with GFR < 50 - appears near baseline so cont to trend  # Hyponatremia - Na 131 on admission; likely hypovolemic due to vomiting and decreased PO intake - start NS @ 75 ml/hr - f/u Na+ in AM  FEN/GI: NPO, NS @ 75 ml/hr Prophylaxis: Heparin given creat clearance low  Disposition: Admit to inpatient   History of Present Illness: [name] is a 72 y.o. female presenting with 2 days of nausea, vomiting and abdominal pain. This has been accompanied by decrease ostomy output for the same amount of time. There have been 5 episodes of non bilious, non blood vomit. The patient notes similar symptoms last year that resolved on their own after several days. She has a history of multiple GI surgeries in 2011 that culminated in a partial colectomy with ileostomy that has remained patent since that time. The  patient denies chest pain, shortness of breath, fever or chills. She cannot remember if she has a primary physician.   Interview conducted in [place] by pacific interpreter. Son also contacted to discuss plan, his phone # is [phone].   Review Of Systems: Per HPI with the following additions: none Otherwise 12 point review of systems was performed and was unremarkable.  Patient Active Problem List   Diagnosis Date Noted  . Bowel obstruction 11/04/2013  . 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 ANEMIA 07/23/2010  . ESSENTIAL HYPERTENSION, BENIGN 07/23/2010  . ISCHEMIC CARDIOMYOPATHY 07/23/2010  . HYPERLIPIDEMIA-MIXED 07/17/2010  . CAD 07/17/2010  . ATRIAL FIBRILLATION 07/17/2010  . RENAL INSUFFICIENCY 07/17/2010  . ASTHMA 07/15/2010   Past Medical History: Past Medical History  Diagnosis Date  . Asthma   . NSTEMI (non-ST elevated myocardial infarction) 2011    medically managed  . Paroxysmal atrial fibrillation     Hx of amiodarone use   Past Surgical History: Past Surgical History  Procedure Laterality Date  . Colostomy Left 2011    Dr. Carolynne Edouard  . Partial colectomy  2011    necrotic bowel after lap chole  . Laparoscopic cholecystectomy  2011  . Ercp  2011   Social History: History  Substance Use Topics  . Smoking status: Never Smoker   . Smokeless tobacco: Not on file  . Alcohol Use: No   Additional social history: From [place]. Lives with son.   Please also refer to relevant sections of EMR.  Family History: No family history on file. Allergies and Medications:  No Known Allergies No current facility-administered medications on file prior to encounter.   No current outpatient prescriptions on file prior to encounter.    Objective: BP 150/58  Pulse 92  Temp(Src) 98.6 F (37 C) (Oral)  Resp 20  Wt 94 lb (42.638 kg)  SpO2 90% Exam: General: petite female, mildly ill appearing, mild distress, actively vomiting HEENT: NCAT, EOMI, PERRLA, ring in septum of  nose Cardiovascular: RRR, 2/6 SM Respiratory: normal WOB, clear to auscultation Abdomen: left sided colostomy with pink mucosa visible, no stool in ostomy back, generalized distention and mild tenderness but no guarding, bowel sounds present Extremities: non tender non swollen Skin: no rashes Neuro: alert, oriented, intact  Labs and Imaging: CBC BMET   Recent Labs Lab 11/04/13 0845  WBC 14.1*  HGB 14.0  HCT 41.0  PLT 272    Recent Labs Lab 11/04/13 0845  NA 131*  K 4.8  CL 100  CO2 19  BUN 21  CREATININE 1.16*  GLUCOSE 136*  CALCIUM 9.6     Dg Chest 2 View  11/04/2013   IMPRESSION: There is mild hyperinflation consistent with COPD. There is no evidence of pneumonia nor CHF.   Electronically Signed   By: David  [place]   On: 11/04/2013 11:44   Ct Abdomen Pelvis W Contrast  11/04/2013   IMPRESSION: 1. The patient has apparently undergone previous right colectomy. There has been creation of an ileostomy on the left. The small bowel exhibits mild distention of its lumen in its proximal 2/3 but there is a relative transition zone to non-opacified small bowel in the left mid active abdomen shortly proximal to the ileostomy. There is no evidence of an internal hernia. There is some herniation of normal-appearing distal ileum into the ostomy site. No intra-abdominal abscess is demonstrated. Contrast will likely be entering the ostomy bag shortly. 2. The colon is collapsed and exhibits no acute abnormality. 3. There is no evidence of acute hepatobiliary abnormality nor acute urinary tract abnormality.   Electronically Signed   By: David  [place]   On: 11/04/2013 13:35    Garnetta Buddy, MD 11/04/2013, 5:19 PM PGY-3, [facility] Family Medicine FPTS Intern pager: [phone], text pages welcome

## 2013-11-04 NOTE — ED Notes (Signed)
Son stated, she started having stomach pain with vomiting yesterday.

## 2013-11-04 NOTE — ED Notes (Signed)
Patient transported to CT 

## 2013-11-04 NOTE — ED Notes (Signed)
Patients family member is present in the room.  Advised doctor ordered urine sample.  Left urine cup at the bedside.

## 2013-11-04 NOTE — ED Notes (Signed)
Admitting MD at bedside.

## 2013-11-04 NOTE — ED Provider Notes (Signed)
Medical screening examination/treatment/procedure(s) were conducted as a shared visit with non-physician practitioner(s) and myself.  I personally evaluated the patient during the encounter.  Abdominal pain with vomiting since yesterday with decreased ileostomy output TTP diffusely without peritoneal signs. Stable ST elevation on EKG d/w Dr. Allyson Sabal.  Patient denies chest pain. Concern for SBO.  EKG Interpretation    Date/Time:  Saturday November 04 2013 09:44:10 EST Ventricular Rate:  83 PR Interval:  122 QRS Duration: 88 QT Interval:  386 QTC Calculation: 453 R Axis:   67 Text Interpretation:  Sinus or ectopic atrial rhythm unchanged inferior ST elevation minimally Confirmed by Manus Gunning  MD, Prisca Gearing ([id number]) on 11/04/2013 10:02:55 AM             Glynn Octave, MD 11/04/13 1610

## 2013-11-05 ENCOUNTER — Inpatient Hospital Stay (HOSPITAL_COMMUNITY): Payer: Medicaid Other

## 2013-11-05 LAB — BASIC METABOLIC PANEL
BUN: 28 mg/dL — ABNORMAL HIGH (ref 6–23)
CO2: 19 mEq/L (ref 19–32)
Calcium: 8.3 mg/dL — ABNORMAL LOW (ref 8.4–10.5)
Chloride: 103 mEq/L (ref 96–112)
Creatinine, Ser: 1.32 mg/dL — ABNORMAL HIGH (ref 0.50–1.10)
Glucose, Bld: 122 mg/dL — ABNORMAL HIGH (ref 70–99)
Potassium: 5 mEq/L (ref 3.5–5.1)
Sodium: 133 mEq/L — ABNORMAL LOW (ref 135–145)

## 2013-11-05 LAB — URINE MICROSCOPIC-ADD ON

## 2013-11-05 LAB — CBC
Hemoglobin: 13 g/dL (ref 12.0–15.0)
MCHC: 33.6 g/dL (ref 30.0–36.0)
MCV: 83.6 fL (ref 78.0–100.0)
Platelets: 244 10*3/uL (ref 150–400)
RBC: 4.63 MIL/uL (ref 3.87–5.11)
RDW: 13 % (ref 11.5–15.5)
WBC: 14.2 10*3/uL — ABNORMAL HIGH (ref 4.0–10.5)

## 2013-11-05 LAB — URINE CULTURE: Culture: NO GROWTH

## 2013-11-05 LAB — URINALYSIS, ROUTINE W REFLEX MICROSCOPIC
Bilirubin Urine: NEGATIVE
Hgb urine dipstick: NEGATIVE
Nitrite: NEGATIVE
Specific Gravity, Urine: 1.028 (ref 1.005–1.030)
Urobilinogen, UA: 0.2 mg/dL (ref 0.0–1.0)
pH: 5 (ref 5.0–8.0)

## 2013-11-05 LAB — TROPONIN I: Troponin I: <0.3 ng/mL (ref <0.30)

## 2013-11-05 MED ORDER — PIPERACILLIN-TAZOBACTAM 3.375 G IVPB
3.3750 g | Freq: Three times a day (TID) | INTRAVENOUS | Status: DC
  Administered 2013-11-05 – 2013-11-07 (×7): 3.375 g via INTRAVENOUS
  Filled 2013-11-05 (×8): qty 50

## 2013-11-05 MED ORDER — ENOXAPARIN SODIUM 30 MG/0.3ML ~~LOC~~ SOLN
30.0000 mg | Freq: Every day | SUBCUTANEOUS | Status: DC
  Administered 2013-11-05 – 2013-11-07 (×3): 30 mg via SUBCUTANEOUS
  Filled 2013-11-05 (×5): qty 0.3

## 2013-11-05 MED ORDER — MORPHINE SULFATE 4 MG/ML IJ SOLN
4.0000 mg | INTRAMUSCULAR | Status: DC | PRN
  Administered 2013-11-05 – 2013-11-09 (×9): 4 mg via INTRAVENOUS
  Filled 2013-11-05 (×9): qty 1

## 2013-11-05 NOTE — Progress Notes (Signed)
ANTIBIOTIC CONSULT NOTE - INITIAL  Pharmacy Consult for zosyn  Indication: intraabdominal coverate  No Known Allergies  Patient Measurements: Height: 4\' 5"  (134.6 cm) Weight: 94 lb (42.638 kg) IBW/kg (Calculated) : 29.4  Vital Signs: Temp: 101.9 F (38.8 C) (12/28 0630) Temp src: Oral (12/27 2133) BP: 123/52 mmHg (12/28 0630) Pulse Rate: 65 (12/28 0630) Intake/Output from previous day: 12/27 0701 - 12/28 0700 In: 868.8 [I.V.:868.8] Out: 150 [Emesis/NG output:150] Intake/Output from this shift:    Labs:  Recent Labs  11/04/13 0845 11/05/13 0655  WBC 14.1* 14.2*  HGB 14.0 13.0  PLT 272 244  CREATININE 1.16* 1.32*   Estimated Creatinine Clearance: 21.7 ml/min (by C-G formula based on Cr of 1.32). No results found for this basename: VANCOTROUGH, VANCOPEAK, VANCORANDOM, GENTTROUGH, GENTPEAK, GENTRANDOM, TOBRATROUGH, TOBRAPEAK, TOBRARND, AMIKACINPEAK, AMIKACINTROU, AMIKACIN,  in the last 72 hours   Microbiology: No results found for this or any previous visit (from the past 720 hour(s)).  Medical History: Past Medical History  Diagnosis Date  . Asthma   . NSTEMI (non-ST elevated myocardial infarction) 2011    medically managed  . Paroxysmal atrial fibrillation     Hx of amiodarone use    Medications:  Prescriptions prior to admission  Medication Sig Dispense Refill  . amLODipine (NORVASC) 5 MG tablet Take 5 mg by mouth daily.      . clopidogrel (PLAVIX) 75 MG tablet Take 75 mg by mouth daily.      . enalapril (VASOTEC) 5 MG tablet Take 5 mg by mouth 2 (two) times daily.      . Fluticasone-Salmeterol (ADVAIR) 250-50 MCG/DOSE AEPB Inhale 1 puff into the lungs 2 (two) times daily.      . metoCLOPramide (REGLAN) 10 MG tablet Take 10 mg by mouth at bedtime.      . metoprolol tartrate (LOPRESSOR) 25 MG tablet Take 25 mg by mouth 2 (two) times daily.      . nitroGLYCERIN (NITROSTAT) 0.4 MG SL tablet Place 0.4 mg under the tongue every 5 (five) minutes as needed for  chest pain.      . simvastatin (ZOCOR) 20 MG tablet Take 20 mg by mouth every morning.       Assessment: 72 yo F to start zosyn per pharmacy for intraabdominal coverage.   Pt with parastomal hernia, pSBO, abd pain, N/V.  PMH is significant for partial colectomy with ileostomy in 2011, CAD, and chronic kidney disease.    Pt is 4'5 inches and 42.6 kg.  Creat 1. 32.  Temp 101.9, WBC 14.2.    12/27 Ucx 12/28 BCx2  Zosyn 12/28>>  Goal of Therapy:  Eradicate infection  Plan:  1. Zosyn 3.375 gm IV q8hrs, infuse each dose over 4 hours 2. F/u renal fxn, culture data, wbc, temp, clinical course Herby Abraham, Pharm.D. [phone] 11/05/2013 9:32 AM

## 2013-11-05 NOTE — Progress Notes (Signed)
Family Medicine Teaching Service Daily Progress Note Intern Pager: [phone]  Patient name: [name] Medical record number: [id number] Date of birth: 07/26/1941 Age: 72 y.o. Gender: female  Primary Care Provider: Viann Shove, MD Consultants: general surgery Code Status: full  Pt Overview and Major Events to Date:  12/27 - admitted with partial SBO 12/28 - develops fever  Assessment and Plan: [name] is a 72 y.o. female presenting with abdominal pain, nausea, vomiting and decreased ostomy output. PMH is significant for partial colectomy with ileostomy in 2011, CAD, and chronic kidney disease.   # Abdomina Pain/Vausea/Vomiting - Decreased ostomy output likely due to partial obstruction  - NPO  - Cont NG tube for decompression  - IV zofran PRN  - Increase morphine for pain to 4 mg q 4 PRN - Given new fever, will check blood cultures, consider CT scan abdomen to look for abscess vs perforation and starting antibiotics for GNR and anaerobic cause   - F/u surgery recs and appreciate all insight   # CAD s/p NSTEMI in 2011 - cardiac cath with multiple vessel disease; Trop neg x1 and EKG without evidence of ischemia on admission; trop neg x 3 - Cont statin, beta blocker ACE-I and plavix   # Asthma - reported per patient  - Cont home dulera, add albuterol PRN   # CKD III - Creat 1.16 with GFR < 50  - appears near baseline so cont to trend   # Hyponatremia - Na 131 on admission; likely hypovolemic due to vomiting and decreased PO intake  - start NS @ 75 ml/hr  - f/u Na+ in AM   FEN/GI: NPO, NS @ 75 ml/hr  Prophylaxis: Heparin given creat clearance low   Disposition: remain inpatient until obstruction resolves  Subjective:  Pt states that her pain is severe this morning despite receiving 2 mg morphine. Persistent nause with NG tube in place. No ostomy output. Pt's son in room and updated.   Interview conducted by pacific interpreters  Objective: Temp:   [98.2 F (36.8 C)-98.8 F (37.1 C)] 98.2 F (36.8 C) (12/27 2133) Pulse Rate:  [72-92] 86 (12/27 2133) Resp:  [17-23] 17 (12/27 2133) BP: (107-150)/(45-88) 144/72 mmHg (12/27 2133) SpO2:  [90 %-99 %] 97 % (12/27 2133) Weight:  [94 lb (42.638 kg)] 94 lb (42.638 kg) (12/27 1700) Physical Exam: General: petite female, mildly ill appearing, mild distress, actively vomiting  HEENT: NCAT, EOMI, PERRLA, ring in septum of nose  Cardiovascular: RRR, 2/6 SM  Respiratory: normal WOB, clear to auscultation  Abdomen: left sided colostomy with pink mucosa visible at stoma, no stool in ostomy back, generalized distention and mild tenderness with some guarding, bowel sounds present  Extremities: non tender non swollen  Skin: no rashes  Neuro: alert, oriented, intact    Laboratory:  Recent Labs Lab 11/04/13 0845  WBC 14.1*  HGB 14.0  HCT 41.0  PLT 272    Recent Labs Lab 11/04/13 0845  NA 131*  K 4.8  CL 100  CO2 19  BUN 21  CREATININE 1.16*  CALCIUM 9.6  PROT 9.0*  BILITOT 1.2  ALKPHOS 180*  ALT 20  AST 33  GLUCOSE 136*      Imaging/Diagnostic Tests: Ct Abdomen Pelvis W Contrast  11/04/2013 IMPRESSION: 1. The patient has apparently undergone previous right colectomy. There has been creation of an ileostomy on the left. The small bowel exhibits mild distention of its lumen in its proximal 2/3 but there is a relative  transition zone to non-opacified small bowel in the left mid active abdomen shortly proximal to the ileostomy. There is no evidence of an internal hernia. There is some herniation of normal-appearing distal ileum into the ostomy site. No intra-abdominal abscess is demonstrated. Contrast will likely be entering the ostomy bag shortly. 2. The colon is collapsed and exhibits no acute abnormality. 3. There is no evidence of acute hepatobiliary abnormality nor acute urinary tract abnormality. Electronically Signed By: David [place] On: 11/04/2013 13:35    Garnetta Buddy, MD 11/05/2013, 3:06 AM PGY-3, [facility] Family Medicine FPTS Intern pager: [phone], text pages welcome

## 2013-11-05 NOTE — Progress Notes (Signed)
FMTS Attending Daily Note: Ara Mano MD [phone] pager office [phone] I  have seen and examined this patient, reviewed their chart. I have discussed this patient with the resident. I agree with the resident's findings, assessment and care plan. 

## 2013-11-05 NOTE — Progress Notes (Signed)
Abdominal pain is gone. Liquid stoll in bag. Abdomen distended somewhat but NT. Cont NGT.Check U/A. Patient examined and I agree with the assessment and plan  Violeta Gelinas, MD, MPH, FACS Pager: [phone]  11/05/2013 11:48 AM

## 2013-11-05 NOTE — Progress Notes (Signed)
Subjective: Pt c/o significant pain to b/l lower abdominal quadrants.  Unsure if nauseated. NG only put out 125mL/24 hours.  Abdomen seems less distended.  Ostomy with flatus and yellow BM.  Febrile this am to Tmax 101.9.  Objective: Vital signs in last 24 hours: Temp:  [98.2 F (36.8 C)-101.9 F (38.8 C)] 101.9 F (38.8 C) (12/28 0630) Pulse Rate:  [65-92] 65 (12/28 0630) Resp:  [16-23] 16 (12/28 0630) BP: (107-150)/(45-88) 123/52 mmHg (12/28 0630) SpO2:  [90 %-99 %] 98 % (12/28 0630) Weight:  [94 lb (42.638 kg)] 94 lb (42.638 kg) (12/27 1700) Last BM Date: 11/03/13  Intake/Output from previous day: 12/27 0701 - 12/28 0700 In: 868.8 [I.V.:868.8] Out: 150 [Emesis/NG output:150] Intake/Output this shift:    PE: Gen:  Alert, NAD, pleasant Abd: soft, mild tenderness around the ileostomy in the left lower abdomen, less distended today, +BS, large healed midline scar, 1cm ileostomy pink and with flatus in bag and yellow pasty stools, multiple bulges noted around the ostomy which are likely a parastomal hernia    Lab Results:   Recent Labs  11/04/13 0845 11/05/13 0655  WBC 14.1* 14.2*  HGB 14.0 13.0  HCT 41.0 38.7  PLT 272 244   BMET  Recent Labs  11/04/13 0845 11/05/13 0655  NA 131* 133*  K 4.8 5.0  CL 100 103  CO2 19 19  GLUCOSE 136* 122*  BUN 21 28*  CREATININE 1.16* 1.32*  CALCIUM 9.6 8.3*   PT/INR No results found for this basename: LABPROT, INR,  in the last 72 hours CMP     Component Value Date/Time   NA 133* 11/05/2013 0655   K 5.0 11/05/2013 0655   CL 103 11/05/2013 0655   CO2 19 11/05/2013 0655   GLUCOSE 122* 11/05/2013 0655   BUN 28* 11/05/2013 0655   CREATININE 1.32* 11/05/2013 0655   CALCIUM 8.3* 11/05/2013 0655   PROT 9.0* 11/04/2013 0845   ALBUMIN 4.2 11/04/2013 0845   AST 33 11/04/2013 0845   ALT 20 11/04/2013 0845   ALKPHOS 180* 11/04/2013 0845   BILITOT 1.2 11/04/2013 0845   GFRNONAA 40* 11/05/2013 0655   GFRAA 46* 11/05/2013  0655   Lipase     Component Value Date/Time   LIPASE 76* 01/06/2013 1638       Studies/Results: Dg Chest 2 View  11/04/2013   CLINICAL DATA:  Upper abdominal discomfort and vomiting and dyspnea  EXAM: CHEST  2 VIEW  COMPARISON:  PA and lateral chest x-ray of June 08, 2011  FINDINGS: The lungs are hyperinflated. There is no focal infiltrate. There is no pleural effusion or pneumothorax. The cardiopericardial silhouette is normal in size. The pulmonary vascularity is not engorged. The mediastinum is normal in width. The observed portions of the bony thorax exhibit no acute abnormalities.  IMPRESSION: There is mild hyperinflation consistent with COPD. There is no evidence of pneumonia nor CHF.   Electronically Signed   By: David  [place]   On: 11/04/2013 11:44   Ct Abdomen Pelvis W Contrast  11/04/2013   CLINICAL DATA:  Right lower quadrant pain, history of colostomy.  EXAM: CT ABDOMEN AND PELVIS WITH CONTRAST  TECHNIQUE: Multidetector CT imaging of the abdomen and pelvis was performed using the standard protocol following bolus administration of intravenous contrast.  CONTRAST:  OMNIPAQUE IOHEXOL 300 MG/ML SOLN intravenously, the patient also received oral contrast material.  COMPARISON:  Acute abdominal series of September 21, 2011 and CT scan of the abdomen and pelvis dated  May 22, 2011.  FINDINGS: The liver exhibits no focal mass nor ductal dilation. The gallbladder is surgically absent. . There is mild dilation of the common bile duct into the head of the pancreas. The pancreas exhibits no focal mass nor ductal dilation. The spleen is normal in size and density. The stomach is distended with the orally administered contrast. There is a tiny hiatal hernia. There are no adrenal masses. The kidneys enhance well with no evidence of obstruction nor pyelonephritis. The caliber of the abdominal aorta is normal. There is no periaortic or pericaval lymphadenopathy.  There is mild distention of  small-bowel loops down to just proximal to the ileostomy. On images 53 through 58 there is subjective mild wall thickening of the distal ileum which appears to be a relative transition zone site. This may merely reflect the fact that contrast has not yet reached this portion of the colon. The unopacified stool filled portion of the distal ileum enters the colostomy in a normal fashion. There is mild herniation of a short segment of distal ileum into the ostomy site without evidence of obstruction.  The patient has apparently undergone right colectomy. The transverse and descending portions of the colon as well as the rectosigmoid are collapsed. The uterus and adnexal structures as well as the urinary bladder are grossly normal for age. There is no inguinal or significant umbilical hernia though there is deviation dehiscence of the abdominal wall musculature in the midline bringing the peritoneal surface close to the skin surface.  The lung bases are clear. The lumbar vertebral bodies are preserved in height.  IMPRESSION: 1. The patient has apparently undergone previous right colectomy. There has been creation of an ileostomy on the left. The small bowel exhibits mild distention of its lumen in its proximal 2/3 but there is a relative transition zone to non-opacified small bowel in the left mid active abdomen shortly proximal to the ileostomy. There is no evidence of an internal hernia. There is some herniation of normal-appearing distal ileum into the ostomy site. No intra-abdominal abscess is demonstrated. Contrast will likely be entering the ostomy bag shortly. 2. The colon is collapsed and exhibits no acute abnormality. 3. There is no evidence of acute hepatobiliary abnormality nor acute urinary tract abnormality.   Electronically Signed   By: David  [place]   On: 11/04/2013 13:35    Anti-infectives: Anti-infectives   None       Assessment/Plan Parastomal hernia Partial small bowel obstruction   Abdominal pain  Nausea/vomiting  Leukocytosis 14.2 Elevated creatinine - 1.32 - climbing Elevated alk phos - 180 yesterday Febrile - 101.9, start zosyn  Plan:  1. NPO for now until pain and nausea better 2. IVF, pain control, antiemetics, bowel rest  3. Does not seem fully obstructed as there is flatus and yellowish stool in the bag and contrast does nearly reach her ileostomy. Hopefully this will resolve on its own with conservative measures.  4. Ambulate, IS, SCD's and lovenox 5. No planned surgical interventions as this is a very hostile abdomen, would need transfer to tertiary care if she wants her ostomy revised or taken down due to complexity of surgery needed and extent of adhesions 6. Hopefully pain, nausea will resolve and she can be discharged to follow up as an outpatient at tertiary center 7. ARI per primary 8. Primary team ordered blood cultures, I placed order for KUB and start zosyn (Day 1)    LOS: 1 day    DORT, Azalia Neuberger 11/05/2013, 8:16 AM  Pager: [phone]

## 2013-11-06 DIAGNOSIS — I10 Essential (primary) hypertension: Secondary | ICD-10-CM

## 2013-11-06 LAB — BASIC METABOLIC PANEL
BUN: 46 mg/dL — ABNORMAL HIGH (ref 6–23)
Calcium: 7.6 mg/dL — ABNORMAL LOW (ref 8.4–10.5)
Creatinine, Ser: 1.77 mg/dL — ABNORMAL HIGH (ref 0.50–1.10)
GFR calc Af Amer: 32 mL/min — ABNORMAL LOW (ref >90)
GFR calc non Af Amer: 28 mL/min — ABNORMAL LOW (ref >90)
Glucose, Bld: 113 mg/dL — ABNORMAL HIGH (ref 70–99)
Sodium: 139 mEq/L (ref 135–145)

## 2013-11-06 LAB — CBC
MCH: 27.7 pg (ref 26.0–34.0)
MCHC: 32.8 g/dL (ref 30.0–36.0)
Platelets: 220 10*3/uL (ref 150–400)
RBC: 4.19 MIL/uL (ref 3.87–5.11)

## 2013-11-06 MED ORDER — PHENOL 1.4 % MT LIQD
1.0000 | OROMUCOSAL | Status: DC | PRN
  Administered 2013-11-06: 1 via OROMUCOSAL
  Filled 2013-11-06: qty 177

## 2013-11-06 MED ORDER — SODIUM CHLORIDE 0.9 % IV SOLN
1000.0000 mL | Freq: Once | INTRAVENOUS | Status: AC
  Administered 2013-11-06: 1000 mL via INTRAVENOUS

## 2013-11-06 MED ORDER — SODIUM CHLORIDE 0.9 % IV SOLN
INTRAVENOUS | Status: AC
  Administered 2013-11-06 – 2013-11-07 (×2): via INTRAVENOUS

## 2013-11-06 MED ORDER — CLOPIDOGREL BISULFATE 75 MG PO TABS
75.0000 mg | ORAL_TABLET | Freq: Every day | ORAL | Status: DC
  Administered 2013-11-06 – 2013-11-07 (×2): 75 mg via ORAL
  Filled 2013-11-06 (×4): qty 1

## 2013-11-06 NOTE — Progress Notes (Signed)
FMTS Attending  Note: Kehinde Eniola,MD I  have seen and examined this patient, reviewed their chart. I have discussed this patient with the resident. I agree with the resident's findings, assessment and care plan.  

## 2013-11-06 NOTE — Progress Notes (Signed)
Family Medicine Teaching Service Daily Progress Note Intern Pager: [phone]  Patient name: [name] Medical record number: [id number] Date of birth: [birth year] Age: 72 y.o. Gender: female  Primary Care Provider: Viann Shove, MD Consultants: general surgery Code Status: full  Pt Overview and Major Events to Date:  12/27 - admitted with partial SBO 12/28 - new fever to 101.9, Zosyn (Day 1), remain NPO, if surgery req will need transferred to tertiary center 12/29 - improved abd pain / nausea. NGT remains, continue NPO  Assessment and Plan: [name] is a 72 y.o. female presenting with abdominal pain, nausea, vomiting and decreased ostomy output. PMH is significant for partial colectomy with ileostomy in 2011, CAD, and chronic kidney disease.   # Partial Small Bowel Obstruction with Abdomina Pain, N/V - Improved Initially presented with decreased ostomy output likely due to partial SBO, demonstrated on CT Abd 12/27. - Note significantly increased ostomy output within 24 hrs. However, febrile 12/28 to 101.9, concerning for intra-abdominal infection but remains afebrile x 24 hrs Improved abdominal pain with intermittent nausea. - Continue conservative measures of bowel rest, remain NPO, NGT for decompression  - IV zofran PRN  - continue morphine IV for pain to 4 mg q 4 PRN - continue Zosyn (Day 2) for empiric GNR / anaerobic abdominal coverage - f/u blood cultures (drawn 12/28 - NGTD x 24 hrs), Urine culture (negative final) - c/s General Surgery, appreciate all recommendations   - continue to follow closely for improvement in partial SBO, remain NPO with NGT until pain / nausea improved   - concern if pt does require surgery, will need to be transferred to tertiary center d/t complexity of surgery   - continue to follow  # CAD s/p NSTEMI in 2011 cardiac cath with multiple vessel disease; Trop neg x1 and EKG without evidence of ischemia on admission; trop neg x  3 - Cont statin, beta blocker ACE-I and plavix (resumed today)  # Asthma - reported per patient  - Cont home dulera, add albuterol PRN   # CKD III - Creat 1.16 with GFR < 50  - appears near baseline so cont to trend   # Hyponatremia Na 131 on admission; likely hypovolemic due to vomiting and decreased PO intake - improving Na trend --> 133-->139  - gentle rehydration  FEN/GI: NPO, NS @ 75 ml/hr  Prophylaxis: Heparin given creat clearance low  Disposition: remain inpatient until obstruction resolves  Subjective:  No acute events overnight. Granddaughters provided translation. Patient reports that her abdominal pain and nausea are improved today compared to yesterday. NGT remains in place and still NPO, notes hungry and has appetite. Significant liquid ostomy output with some semi-solid matter (increased from yesterday). No other concerns at this time.  Objective: Temp:  [99.1 F (37.3 C)-99.3 F (37.4 C)] 99.3 F (37.4 C) (12/29 0533) Pulse Rate:  [68-81] 81 (12/29 0533) Resp:  [16] 16 (12/29 0533) BP: (91-109)/(50-53) 109/53 mmHg (12/29 0533) SpO2:  [92 %-96 %] 93 % (12/29 0916) Physical Exam: General: petite female, mildly ill appearing, mild distress, actively vomiting  HEENT: NCAT, EOMI, PERRLA, ring in septum of nose  Cardiovascular: RRR, 2/6 SM  Respiratory: normal WOB, clear to auscultation  Abdomen: left sided colostomy with pink mucosa visible at stoma, liquid yellow-brown stool, improved generalized distention (R>L) and mild tenderness with minimal guarding, +active BS Extremities: non tender, non swollen  Skin: no rashes  Neuro: alert, oriented, intact   Laboratory:  Recent Labs Lab 11/04/13  0845 11/05/13 0655 11/06/13 0835  WBC 14.1* 14.2* 5.0  HGB 14.0 13.0 11.6*  HCT 41.0 38.7 35.4*  PLT 272 244 220    Recent Labs Lab 11/04/13 0845 11/05/13 0655 11/06/13 0835  NA 131* 133* 139  K 4.8 5.0 4.5  CL 100 103 107  CO2 19 19 20   BUN 21 28* 46*   CREATININE 1.16* 1.32* 1.77*  CALCIUM 9.6 8.3* 7.6*  PROT 9.0*  --   --   BILITOT 1.2  --   --   ALKPHOS 180*  --   --   ALT 20  --   --   AST 33  --   --   GLUCOSE 136* 122* 113*      Imaging/Diagnostic Tests: Ct Abdomen Pelvis W Contrast  11/04/2013 IMPRESSION: 1. The patient has apparently undergone previous right colectomy. There has been creation of an ileostomy on the left. The small bowel exhibits mild distention of its lumen in its proximal 2/3 but there is a relative transition zone to non-opacified small bowel in the left mid active abdomen shortly proximal to the ileostomy. There is no evidence of an internal hernia. There is some herniation of normal-appearing distal ileum into the ostomy site. No intra-abdominal abscess is demonstrated. Contrast will likely be entering the ostomy bag shortly. 2. The colon is collapsed and exhibits no acute abnormality. 3. There is no evidence of acute hepatobiliary abnormality nor acute urinary tract abnormality. Electronically Signed By: David [place] On: 11/04/2013 13:35   12/28 KUB IMPRESSION:  1. Persistent partial small bowel obstruction. No free air.   Saralyn Pilar, DO 11/06/2013, 1:08 PM PGY-1, [facility] Family Medicine FPTS Intern pager: [phone], text pages welcome

## 2013-11-06 NOTE — Progress Notes (Signed)
Very difficult language barrier SBO seems to be improving with some ostomy output, but still requiring pain meds for abdominal pain.

## 2013-11-06 NOTE — Progress Notes (Signed)
Subjective: Pt notes she is better.  She is constantly asking about going home.  She notes he pain is improved with the pain medication, but when it wears off the pain is back.  Also still nauseated intermittently.  Not ambulating OOB much.  Family at bedside interpreting.    Objective: Vital signs in last 24 hours: Temp:  [99.1 F (37.3 C)-99.3 F (37.4 C)] 99.3 F (37.4 C) (12/29 0533) Pulse Rate:  [68-81] 81 (12/29 0533) Resp:  [16] 16 (12/29 0533) BP: (91-109)/(50-53) 109/53 mmHg (12/29 0533) SpO2:  [92 %-96 %] 95 % (12/29 0533) Last BM Date: 11/05/13  Intake/Output from previous day: 12/28 0701 - 12/29 0700 In: 3032.5 [I.V.:1792.5; NG/GT:90; IV Piggyback:150] Out: 650 [Emesis/NG output:550; Stool:100] Intake/Output this shift:    PE: Gen:  Alert, NAD, pleasant Abd: soft, mild tenderness around the ileostomy in the left lower abdomen, some what distended, +BS, large healed midline scar, 1cm ileostomy pink and with flatus in bag and yellow pasty stools, multiple bulges noted around the ostomy which are likely a parastomal hernia   Lab Results:   Recent Labs  11/04/13 0845 11/05/13 0655  WBC 14.1* 14.2*  HGB 14.0 13.0  HCT 41.0 38.7  PLT 272 244   BMET  Recent Labs  11/04/13 0845 11/05/13 0655  NA 131* 133*  K 4.8 5.0  CL 100 103  CO2 19 19  GLUCOSE 136* 122*  BUN 21 28*  CREATININE 1.16* 1.32*  CALCIUM 9.6 8.3*   PT/INR No results found for this basename: LABPROT, INR,  in the last 72 hours CMP     Component Value Date/Time   NA 133* 11/05/2013 0655   K 5.0 11/05/2013 0655   CL 103 11/05/2013 0655   CO2 19 11/05/2013 0655   GLUCOSE 122* 11/05/2013 0655   BUN 28* 11/05/2013 0655   CREATININE 1.32* 11/05/2013 0655   CALCIUM 8.3* 11/05/2013 0655   PROT 9.0* 11/04/2013 0845   ALBUMIN 4.2 11/04/2013 0845   AST 33 11/04/2013 0845   ALT 20 11/04/2013 0845   ALKPHOS 180* 11/04/2013 0845   BILITOT 1.2 11/04/2013 0845   GFRNONAA 40* 11/05/2013 0655    GFRAA 46* 11/05/2013 0655   Lipase     Component Value Date/Time   LIPASE 76* 01/06/2013 1638       Studies/Results: Dg Chest 2 View  11/04/2013   CLINICAL DATA:  Upper abdominal discomfort and vomiting and dyspnea  EXAM: CHEST  2 VIEW  COMPARISON:  PA and lateral chest x-ray of June 08, 2011  FINDINGS: The lungs are hyperinflated. There is no focal infiltrate. There is no pleural effusion or pneumothorax. The cardiopericardial silhouette is normal in size. The pulmonary vascularity is not engorged. The mediastinum is normal in width. The observed portions of the bony thorax exhibit no acute abnormalities.  IMPRESSION: There is mild hyperinflation consistent with COPD. There is no evidence of pneumonia nor CHF.   Electronically Signed   By: David  [place]   On: 11/04/2013 11:44   Ct Abdomen Pelvis W Contrast  11/04/2013   CLINICAL DATA:  Right lower quadrant pain, history of colostomy.  EXAM: CT ABDOMEN AND PELVIS WITH CONTRAST  TECHNIQUE: Multidetector CT imaging of the abdomen and pelvis was performed using the standard protocol following bolus administration of intravenous contrast.  CONTRAST:  OMNIPAQUE IOHEXOL 300 MG/ML SOLN intravenously, the patient also received oral contrast material.  COMPARISON:  Acute abdominal series of September 21, 2011 and CT scan of the abdomen  and pelvis dated May 22, 2011.  FINDINGS: The liver exhibits no focal mass nor ductal dilation. The gallbladder is surgically absent. . There is mild dilation of the common bile duct into the head of the pancreas. The pancreas exhibits no focal mass nor ductal dilation. The spleen is normal in size and density. The stomach is distended with the orally administered contrast. There is a tiny hiatal hernia. There are no adrenal masses. The kidneys enhance well with no evidence of obstruction nor pyelonephritis. The caliber of the abdominal aorta is normal. There is no periaortic or pericaval lymphadenopathy.  There is  mild distention of small-bowel loops down to just proximal to the ileostomy. On images 53 through 58 there is subjective mild wall thickening of the distal ileum which appears to be a relative transition zone site. This may merely reflect the fact that contrast has not yet reached this portion of the colon. The unopacified stool filled portion of the distal ileum enters the colostomy in a normal fashion. There is mild herniation of a short segment of distal ileum into the ostomy site without evidence of obstruction.  The patient has apparently undergone right colectomy. The transverse and descending portions of the colon as well as the rectosigmoid are collapsed. The uterus and adnexal structures as well as the urinary bladder are grossly normal for age. There is no inguinal or significant umbilical hernia though there is deviation dehiscence of the abdominal wall musculature in the midline bringing the peritoneal surface close to the skin surface.  The lung bases are clear. The lumbar vertebral bodies are preserved in height.  IMPRESSION: 1. The patient has apparently undergone previous right colectomy. There has been creation of an ileostomy on the left. The small bowel exhibits mild distention of its lumen in its proximal 2/3 but there is a relative transition zone to non-opacified small bowel in the left mid active abdomen shortly proximal to the ileostomy. There is no evidence of an internal hernia. There is some herniation of normal-appearing distal ileum into the ostomy site. No intra-abdominal abscess is demonstrated. Contrast will likely be entering the ostomy bag shortly. 2. The colon is collapsed and exhibits no acute abnormality. 3. There is no evidence of acute hepatobiliary abnormality nor acute urinary tract abnormality.   Electronically Signed   By: David  [place]   On: 11/04/2013 13:35   Dg Abd 2 Views  11/05/2013   CLINICAL DATA:  Abdominal pain. Parastomal hernia with small bowel obstruction.   EXAM: ABDOMEN - 2 VIEW  COMPARISON:  CT, 11/04/2013  FINDINGS: Mildly dilated of small bowel are noted, mostly in the central and lower abdomen. There are air-fluid levels on the decubitus view. No free air.  Nasogastric tube tip lies in the distal stomach versus the 1st portion of the duodenum. Clips are upper quadrant reflect a cholecystectomy. Partly contrast filled mildly dilated loops of small bowel are noted in the left lower quadrant consistent with the parastomal hernia.  Soft tissues are otherwise unremarkable.  IMPRESSION: 1. Persistent partial small bowel obstruction.  No free air.   Electronically Signed   By: Amie Portland M.D.   On: 11/05/2013 16:42    Anti-infectives: Anti-infectives   Start     Dose/Rate Route Frequency Ordered Stop   11/05/13 1030  piperacillin-tazobactam (ZOSYN) IVPB 3.375 g     3.375 g 12.5 mL/hr over 240 Minutes Intravenous 3 times per day 11/05/13 1610         Assessment/Plan Parastomal hernia  Partial small bowel obstruction  Abdominal pain  Nausea/vomiting  Leukocytosis 5.0 today Elevated creatinine - 1.32 - pending recheck Febrile - 101.9 yesterday, but been afebrile last 24 hours  Plan:  1. NPO for now until pain and nausea better, NG tube put out 2. IVF, pain control, antiemetics, bowel rest  3. Does not seem fully obstructed as there is flatus and yellowish stool in the bag and contrast does nearly reach her ileostomy. Hopefully this will resolve on its own with conservative measures.  4. Ambulate, IS, SCD's and lovenox  5. No planned surgical interventions as this is a very hostile abdomen, would need transfer to tertiary care if she wants her ostomy revised or taken down due to complexity of surgery needed and extent of adhesions  6. Hopefully pain, nausea will resolve and she can be discharged to follow up as an outpatient at tertiary center  7. ARI per primary  8. Primary team ordered blood cultures which are pending, KUB yesterday  with partial obstruction pattern, zosyn (Day 2)     LOS: 2 days    DORT, Jhonathan Desroches 11/06/2013, 7:57 AM Pager: [phone]

## 2013-11-07 LAB — BASIC METABOLIC PANEL
BUN: 35 mg/dL — ABNORMAL HIGH (ref 6–23)
CO2: 19 mEq/L (ref 19–32)
Calcium: 7.7 mg/dL — ABNORMAL LOW (ref 8.4–10.5)
Creatinine, Ser: 1.26 mg/dL — ABNORMAL HIGH (ref 0.50–1.10)
GFR calc Af Amer: 49 mL/min — ABNORMAL LOW (ref >90)
GFR calc non Af Amer: 42 mL/min — ABNORMAL LOW (ref >90)
Sodium: 144 mEq/L (ref 137–147)

## 2013-11-07 MED ORDER — SODIUM CHLORIDE 0.9 % IV SOLN
INTRAVENOUS | Status: DC
  Administered 2013-11-07 – 2013-11-08 (×4): via INTRAVENOUS

## 2013-11-07 NOTE — Progress Notes (Signed)
Family Medicine Teaching Service Daily Progress Note Intern Pager: [phone]  Patient name: [name] Medical record number: [id number] Date of birth: 08-Apr-1941 Age: 72 y.o. Gender: female  Primary Care Provider: Viann Shove, MD Consultants: general surgery Code Status: full  Pt Overview and Major Events to Date:  12/27 - admitted with partial SBO 12/28 - new fever to 101.9, Zosyn (Day 1), remain NPO, if surgery req will need transferred to tertiary center 12/29 - improved abd pain / nausea. NGT remains, continue NPO 12/30 - NG clamped, advance to small sips  Assessment and Plan: [name] is a 72 y.o. female presenting with abdominal pain, nausea, vomiting and decreased ostomy output. PMH is significant for partial colectomy with ileostomy in 2011, CAD, and chronic kidney disease.   # Partial Small Bowel Obstruction with Abdominal Pain, N/V - Improved Initially presented with decreased ostomy output likely due to partial SBO, demonstrated on CT Abd 12/27. - Note significantly increased ostomy output within 24 hrs. Febrile 12/28 to 101.9, concerning for intra-abdominal infection but remains afebrile x 48 hrs Improved abdominal pain with intermittent nausea. - Continue conservative measures of bowel rest, remain NPO, NGT for decompression  - IV zofran PRN  - continue morphine IV for pain to 4 mg q 4 PRN - continue Zosyn (Day 2) for empiric GNR / anaerobic abdominal coverage - f/u blood cultures (drawn 12/28 - NGTD x 24 hrs), Urine culture (negative final) - c/s General Surgery, appreciate all recommendations:   - continue to follow closely for improvement in partial SBO   - concern if pt does require surgery, will need to be transferred to tertiary center d/t complexity of surgery   - advance to small sips today, clamp NGT.  # CAD s/p NSTEMI in 2011 cardiac cath with multiple vessel disease; Trop neg x1 and EKG without evidence of ischemia on admission; trop  neg x 3 - Cont statin, beta blocker ACE-I and plavix  # Asthma - reported per patient  - Cont home dulera, add albuterol PRN   # CKD III - Creat 1.16 with GFR < 50  - appears near baseline so cont to trend   # Hyponatremia Na 131 on admission; likely hypovolemic due to vomiting and decreased PO intake - improving Na trend --> 133-->139  - gentle rehydration  FEN/GI: NPO, NS @ 100 ml/hr  Prophylaxis: on lovenox  Disposition: remain inpatient until obstruction resolves  Subjective:  No acute events overnight. Granddaughters provided translation. Pt pain improved, still tender in epigastric area. No other complaints or questions. Has not started small sips yet.  Objective: Temp:  [97.4 F (36.3 C)-98.6 F (37 C)] 97.5 F (36.4 C) (12/30 0514) Pulse Rate:  [72-81] 80 (12/30 0514) Resp:  [16-18] 16 (12/30 0514) BP: (95-121)/(45-58) 121/58 mmHg (12/30 0514) SpO2:  [93 %-95 %] 95 % (12/30 0514) Physical Exam: General: petite female, mildly ill appearing, mild distress, actively vomiting  HEENT: NCAT, EOMI, PERRLA, ring in septum of nose  Cardiovascular: RRR, 2/6 SM appreciated Respiratory: CTAB, effort normal  Abdomen: left sided colostomy with pink mucosa visible at stoma, small amount of brown stool, , +active BS. Mild tenderness to epigastric area. Extremities: no edema Skin: no rashes  Neuro: awake and alert   Laboratory:  Recent Labs Lab 11/04/13 0845 11/05/13 0655 11/06/13 0835  WBC 14.1* 14.2* 5.0  HGB 14.0 13.0 11.6*  HCT 41.0 38.7 35.4*  PLT 272 244 220    Recent Labs Lab 11/04/13 0845 11/05/13  1610 11/06/13 0835  NA 131* 133* 139  K 4.8 5.0 4.5  CL 100 103 107  CO2 19 19 20   BUN 21 28* 46*  CREATININE 1.16* 1.32* 1.77*  CALCIUM 9.6 8.3* 7.6*  PROT 9.0*  --   --   BILITOT 1.2  --   --   ALKPHOS 180*  --   --   ALT 20  --   --   AST 33  --   --   GLUCOSE 136* 122* 113*      Imaging/Diagnostic Tests: Ct Abdomen Pelvis W Contrast   11/04/2013 IMPRESSION: 1. The patient has apparently undergone previous right colectomy. There has been creation of an ileostomy on the left. The small bowel exhibits mild distention of its lumen in its proximal 2/3 but there is a relative transition zone to non-opacified small bowel in the left mid active abdomen shortly proximal to the ileostomy. There is no evidence of an internal hernia. There is some herniation of normal-appearing distal ileum into the ostomy site. No intra-abdominal abscess is demonstrated. Contrast will likely be entering the ostomy bag shortly. 2. The colon is collapsed and exhibits no acute abnormality. 3. There is no evidence of acute hepatobiliary abnormality nor acute urinary tract abnormality. Electronically Signed By: David Swaziland On: 11/04/2013 13:35   12/28 KUB IMPRESSION:  1. Persistent partial small bowel obstruction. No free air.   Tawni Carnes, MD 11/07/2013, 7:30 AM PGY-1, [facility] Family Medicine FPTS Intern pager: [phone], text pages welcome

## 2013-11-07 NOTE — Progress Notes (Signed)
Seems to be improving slowly. Less NG output - will clamp today  Wilmon Arms. Corliss Skains, MD, [facility]  General/ Trauma Surgery  11/07/2013 8:50 AM

## 2013-11-07 NOTE — Progress Notes (Signed)
Subjective: It is more clear today from the patient and her family that she is improved today.  Her NG output is 350/24hours.  Its dark, but her family has been spraying a lot of chloraseptic spray.  Hasn't required pain medication.  No stool recorded yesterday, but flatus in bag.  Urinating well.    Objective: Vital signs in last 24 hours: Temp:  [97.4 F (36.3 C)-98.6 F (37 C)] 97.5 F (36.4 C) (12/30 0514) Pulse Rate:  [72-81] 80 (12/30 0514) Resp:  [16-18] 16 (12/30 0514) BP: (95-121)/(45-58) 121/58 mmHg (12/30 0514) SpO2:  [93 %-95 %] 95 % (12/30 0514) Last BM Date: Dec 02, 2013  Intake/Output from previous day: 12/29 0701 - 12/30 0700 In: 660 [I.V.:600; NG/GT:60] Out: 350 [Emesis/NG output:350] Intake/Output this shift:    PE: Gen:  Alert, NAD, pleasant Abd: soft, non-tender, non-distended, +BS, large healed midline scar, 1cm ileostomy pink and with flatus in bag and brownish liquid in bag, parastomal hernia looks smaller/less bulge than yesterday   Lab Results:   Recent Labs  12-02-2013 0655 11/06/13 0835  WBC 14.2* 5.0  HGB 13.0 11.6*  HCT 38.7 35.4*  PLT 244 220   BMET  Recent Labs  12/02/13 0655 11/06/13 0835  NA 133* 139  K 5.0 4.5  CL 103 107  CO2 19 20  GLUCOSE 122* 113*  BUN 28* 46*  CREATININE 1.32* 1.77*  CALCIUM 8.3* 7.6*   PT/INR No results found for this basename: LABPROT, INR,  in the last 72 hours CMP     Component Value Date/Time   NA 139 11/06/2013 0835   K 4.5 11/06/2013 0835   CL 107 11/06/2013 0835   CO2 20 11/06/2013 0835   GLUCOSE 113* 11/06/2013 0835   BUN 46* 11/06/2013 0835   CREATININE 1.77* 11/06/2013 0835   CALCIUM 7.6* 11/06/2013 0835   PROT 9.0* 11/04/2013 0845   ALBUMIN 4.2 11/04/2013 0845   AST 33 11/04/2013 0845   ALT 20 11/04/2013 0845   ALKPHOS 180* 11/04/2013 0845   BILITOT 1.2 11/04/2013 0845   GFRNONAA 28* 11/06/2013 0835   GFRAA 32* 11/06/2013 0835   Lipase     Component Value Date/Time   LIPASE  76* 01/06/2013 1638       Studies/Results: Dg Abd 2 Views  12/02/13   CLINICAL DATA:  Abdominal pain. Parastomal hernia with small bowel obstruction.  EXAM: ABDOMEN - 2 VIEW  COMPARISON:  CT, 11/04/2013  FINDINGS: Mildly dilated of small bowel are noted, mostly in the central and lower abdomen. There are air-fluid levels on the decubitus view. No free air.  Nasogastric tube tip lies in the distal stomach versus the 1st portion of the duodenum. Clips are upper quadrant reflect a cholecystectomy. Partly contrast filled mildly dilated loops of small bowel are noted in the left lower quadrant consistent with the parastomal hernia.  Soft tissues are otherwise unremarkable.  IMPRESSION: 1. Persistent partial small bowel obstruction.  No free air.   Electronically Signed   By: Amie Portland M.D.   On: 2013-12-02 16:42    Anti-infectives: Anti-infectives   Start     Dose/Rate Route Frequency Ordered Stop   12/02/13 1030  piperacillin-tazobactam (ZOSYN) IVPB 3.375 g     3.375 g 12.5 mL/hr over 240 Minutes Intravenous 3 times per day December 02, 2013 1610         Assessment/Plan Parastomal hernia  Partial small bowel obstruction  Abdominal pain  Nausea/vomiting  Leukocytosis 5.0 yesterday Elevated creatinine - 1.77 yesterday Febrile - 101.9  yesterday, but been afebrile last 24 hours   Plan:  1. NPO except sips (59ml/hour) during NG clamp, NG tube put out  2. IVF, pain control, antiemetics, bowel rest  3. Does not seem fully obstructed as there is flatus and brownish liquid in bag; seems to be resolving; clamp NG for 24 hours, allow sips of clear liquids during trial.  If low output tomorrow would d/c ng tube and start clears 4. Ambulate, IS, SCD's and lovenox  5. No planned surgical interventions as this is a very hostile abdomen, would need transfer to tertiary care if she wants her ostomy revised or taken down due to complexity of surgery needed and extent of adhesions  6. Hopefully pain,  nausea will resolve and she can be discharged to follow up as an outpatient at tertiary center  7. ARI per primary  8. Primary team ordered blood cultures which are NGTD, KUB 2 days ago with partial obstruction pattern, zosyn (Day 3)     LOS: 3 days    DORT, [name] 11/07/2013, 8:26 AM Pager: [phone]

## 2013-11-07 NOTE — Progress Notes (Signed)
FMTS Attending  Note: Regina Sudol,MD I  have seen and examined this patient, reviewed their chart. I have discussed this patient with the resident. I agree with the resident's findings, assessment and care plan.  

## 2013-11-08 ENCOUNTER — Inpatient Hospital Stay (HOSPITAL_COMMUNITY): Payer: Medicaid Other

## 2013-11-08 ENCOUNTER — Encounter (HOSPITAL_COMMUNITY): Payer: Self-pay | Admitting: *Deleted

## 2013-11-08 ENCOUNTER — Encounter (HOSPITAL_COMMUNITY)
Admission: EM | Disposition: A | Discharge status: Home / Self Care | Payer: Self-pay | Source: Home / Self Care | Attending: Family Medicine

## 2013-11-08 DIAGNOSIS — D62 Acute posthemorrhagic anemia: Secondary | ICD-10-CM

## 2013-11-08 DIAGNOSIS — K922 Gastrointestinal hemorrhage, unspecified: Secondary | ICD-10-CM

## 2013-11-08 HISTORY — PX: ESOPHAGOGASTRODUODENOSCOPY: SHX5428

## 2013-11-08 LAB — BASIC METABOLIC PANEL
BUN: 34 mg/dL — ABNORMAL HIGH (ref 6–23)
CO2: 18 mEq/L — ABNORMAL LOW (ref 19–32)
CO2: 19 mEq/L (ref 19–32)
Calcium: 6.5 mg/dL — ABNORMAL LOW (ref 8.4–10.5)
Calcium: 6.6 mg/dL — ABNORMAL LOW (ref 8.4–10.5)
Creatinine, Ser: 1 mg/dL (ref 0.50–1.10)
Creatinine, Ser: 1.07 mg/dL (ref 0.50–1.10)
GFR calc Af Amer: 65 mL/min — ABNORMAL LOW (ref >90)
GFR calc non Af Amer: 56 mL/min — ABNORMAL LOW (ref >90)
GFR calc non Af Amer: 51 mL/min — ABNORMAL LOW (ref >90)
Glucose, Bld: 131 mg/dL — ABNORMAL HIGH (ref 70–99)
Glucose, Bld: 121 mg/dL — ABNORMAL HIGH (ref 70–99)
Sodium: 142 mEq/L (ref 137–147)

## 2013-11-08 LAB — CBC
HCT: 16.5 % — ABNORMAL LOW (ref 36.0–46.0)
MCH: 27.7 pg (ref 26.0–34.0)
MCHC: 32.1 g/dL (ref 30.0–36.0)
MCV: 86.4 fL (ref 78.0–100.0)
Platelets: 188 10*3/uL (ref 150–400)
RDW: 13.8 % (ref 11.5–15.5)
WBC: 10.2 10*3/uL (ref 4.0–10.5)

## 2013-11-08 LAB — PREPARE RBC (CROSSMATCH)

## 2013-11-08 LAB — PROTIME-INR: Prothrombin Time: 18.5 seconds — ABNORMAL HIGH (ref 11.6–15.2)

## 2013-11-08 LAB — HEMOGLOBIN AND HEMATOCRIT, BLOOD: Hemoglobin: 10.8 g/dL — ABNORMAL LOW (ref 12.0–15.0)

## 2013-11-08 SURGERY — EGD (ESOPHAGOGASTRODUODENOSCOPY)
Anesthesia: Moderate Sedation

## 2013-11-08 MED ORDER — FENTANYL CITRATE 0.05 MG/ML IJ SOLN
INTRAMUSCULAR | Status: AC
  Filled 2013-11-08: qty 2

## 2013-11-08 MED ORDER — MIDAZOLAM HCL 10 MG/2ML IJ SOLN
INTRAMUSCULAR | Status: DC | PRN
  Administered 2013-11-08: 1 mg via INTRAVENOUS
  Administered 2013-11-08 (×4): .5 mg via INTRAVENOUS
  Administered 2013-11-08: 1 mg via INTRAVENOUS

## 2013-11-08 MED ORDER — FENTANYL CITRATE 0.05 MG/ML IJ SOLN
INTRAMUSCULAR | Status: DC | PRN
  Administered 2013-11-08: 25 ug via INTRAVENOUS
  Administered 2013-11-08 (×4): 12.5 ug via INTRAVENOUS

## 2013-11-08 MED ORDER — SODIUM CHLORIDE 0.9 % IV BOLUS (SEPSIS)
1000.0000 mL | Freq: Once | INTRAVENOUS | Status: AC
  Administered 2013-11-08: 1000 mL via INTRAVENOUS

## 2013-11-08 MED ORDER — DIPHENHYDRAMINE HCL 50 MG/ML IJ SOLN
INTRAMUSCULAR | Status: AC
  Filled 2013-11-08: qty 1

## 2013-11-08 MED ORDER — SODIUM CHLORIDE 0.9 % IV SOLN: INTRAVENOUS | Status: DC

## 2013-11-08 MED ORDER — MIDAZOLAM HCL 5 MG/ML IJ SOLN
INTRAMUSCULAR | Status: AC
  Filled 2013-11-08: qty 2

## 2013-11-08 MED ORDER — BUTAMBEN-TETRACAINE-BENZOCAINE 2-2-14 % EX AERO
INHALATION_SPRAY | CUTANEOUS | Status: DC | PRN
  Administered 2013-11-08: 2 via TOPICAL

## 2013-11-08 MED ORDER — PANTOPRAZOLE SODIUM 40 MG IV SOLR
40.0000 mg | Freq: Two times a day (BID) | INTRAVENOUS | Status: DC
  Administered 2013-11-08 – 2013-11-10 (×5): 40 mg via INTRAVENOUS
  Filled 2013-11-08 (×8): qty 40

## 2013-11-08 NOTE — Progress Notes (Signed)
Family Medicine Teaching Service Daily Progress Note Intern Pager: [phone]  Patient name: [name] Medical record number: [id number] Date of birth: 02-07-1941 Age: 72 y.o. Gender: female  Primary Care Provider: Viann Shove, MD Consultants: general surgery Code Status: full  Pt Overview and Major Events to Date:  12/27 - admitted with partial SBO 12/28 - new fever to 101.9, Zosyn (Day 1), remain NPO, if surgery req will need transferred to tertiary center 12/29 - improved abd pain / nausea. NGT remains, continue NPO 12/30 - NG clamped, advance to small sips, o/n episode hematemesis --> NGT return to suction 12/31 - Hgb 11.6-->5.4 (pending confirmation repeat H/H), consult GI --> EGD 12/31 - transfer to SDU, ordered 2u PRBC  Assessment and Plan: [name] is a 72 y.o. female presenting with abdominal pain, nausea, vomiting and decreased ostomy output, found to have a partial SBO. PMH is significant for partial colectomy with ileostomy in 2011, CAD, and chronic kidney disease.   # Partial Small Bowel Obstruction with Abdominal Pain, N/V - Improved Initially presented with decreased ostomy output likely due to partial SBO, demonstrated on CT Abd 12/27. - Note decline in ostomy output within 24 hrs, remained afebrile > 72 hrs (last fever 101.9 on 12/28) - Previously improved with NGT clamped yesterday, tolerated frequent sips Persistent abdominal pain, however improved from previous days, still with intermittent nausea. Significant episode of hematemesis last night. - Continue conservative measures of bowel rest, remain NPO, NGT for decompression  - IV zofran PRN  - continue morphine IV for pain to 4 mg q 4 PRN - continue Zosyn (Day 2) for empiric GNR / anaerobic abdominal coverage - f/u blood cultures (drawn 12/28 - NGTD x 24 hrs), Urine culture (negative final) - c/s General Surgery, appreciate all recommendations:   - continue to follow closely for improvement  in partial SBO   - concern if pt does require surgery, will need to be transferred to tertiary center d/t complexity of surgery   - resume NGT clamp currently, if recurrent vomiting will resume wall suction   - STAT CXR abd/chest - eval for free air and f/u SBO  # Acute Blood Loss Anemia, secondary to UGI Bleed Significant episode hematemesis last night, 200-300cc gross blood + clots, resumed NGT suction with improvement. Otherwise clinically stable, with some persistently low systolic BPs (low 88), given 1L NS bolus with good response, improved BP to systolic 112. Concern for NGT irritation (question is suction trigger bleed?) vs gastritis, concern for possible ulcer. Transfer to SDU given active UGI - decreasing Hgb trend 11.6--> reported 5.4 (re-check to confirm pending, stat H/H) - per Surgery, ordered 2u PRBC (Type O), 1L NS bolus, pending Type & Screen - DC'd Plavix, Lovenox - continue Protonix IV q 12 hr - c/s GI (Dr. Elnoria Howard), greatly appreciate all assistance   - keep NPO   - arranged EGD today to eval UGI  # CAD s/p NSTEMI in 2011 cardiac cath with multiple vessel disease; Trop neg x1 and EKG without evidence of ischemia on admission; trop neg x 3 - Cont statin, beta blocker ACE-I - hold plavix given UGI bleed  # Asthma - reported per patient  - Cont home dulera, add albuterol PRN   # AKI, in setting of CKD III - Improved Baseline Cr around 1.00, GFR <50 - improving Cr trend 1.77-->1.26-->1.00  # Hyponatremia - Resolved Na 131 on admission; likely hypovolemic due to vomiting and decreased PO intake - improving Na trend -->  133-->139-->144-->145 - gentle rehydration  FEN/GI: NPO, NS @ 75 ml/hr  Prophylaxis: HOLD Lovenox  Disposition: remain inpatient until obstruction resolves, transferred to SDU d/t active UGI bleed hypotension and acute blood loss anemia. Pending 2u PRBC transfusion, EGD procedure. Continue to monitor for clinical improvement / status change. Expect  continued multiple day hospital stay.  Subjective:  Overnight significant event of hematemesis (see prior interval progress note for details), since resolved with resuming NGT to wall suction. With assistance of Granddaughters for translation, patient reported improved abdominal tenderness (still persistent, but better today), intermittent nausea, notes thirsty but minimal appetite. Feels tired. Denies chest pain, dyspnea, HA, lightheadedness.  Objective: Temp:  [97.3 F (36.3 C)-99.2 F (37.3 C)] 98.2 F (36.8 C) (12/31 0932) Pulse Rate:  [69-79] 77 (12/31 0932) Resp:  [16-20] 16 (12/31 0932) BP: (88-115)/(48-58) 104/48 mmHg (12/31 0932) SpO2:  [91 %-99 %] 97 % (12/31 0932) Physical Exam: Gen - awake, granddaughters at bedside, NAD  HEENT - PERRL, EOMI, NGT in place (light wall suction, continued minimal output since 2:00am 25cc dark blood) Heart - RRR, persistent 2/6 systolic murmur  Lungs - CTAB  Abd - L-colostomy bag with small amt dark black-brown liquid with minimal semi-solid stool, pink mucosa at stoma, noted generalized tenderness, non-distended, +active BS  Ext - WWP, non-tender, moves all ext, peripheral pulses intact  Neuro - on awakening, alert, oriented, grossly non-focal, intact muscle strength   Laboratory:  Recent Labs Lab 11/04/13 0845 11/05/13 0655 11/06/13 0835  WBC 14.1* 14.2* 5.0  HGB 14.0 13.0 11.6*  HCT 41.0 38.7 35.4*  PLT 272 244 220    Recent Labs Lab 11/04/13 0845  11/06/13 0835 11/07/13 0650 11/08/13 0730  NA 131*  < > 139 144 142  K 4.8  < > 4.5 4.3 4.5  CL 100  < > 107 113* 115*  CO2 19  < > 20 19 18*  BUN 21  < > 46* 35* 32*  CREATININE 1.16*  < > 1.77* 1.26* 1.00  CALCIUM 9.6  < > 7.6* 7.7* 6.5*  PROT 9.0*  --   --   --   --   BILITOT 1.2  --   --   --   --   ALKPHOS 180*  --   --   --   --   ALT 20  --   --   --   --   AST 33  --   --   --   --   GLUCOSE 136*  < > 113* 72 131*  < > = values in this interval not  displayed.    Imaging/Diagnostic Tests: Ct Abdomen Pelvis W Contrast  11/04/2013 IMPRESSION: 1. The patient has apparently undergone previous right colectomy. There has been creation of an ileostomy on the left. The small bowel exhibits mild distention of its lumen in its proximal 2/3 but there is a relative transition zone to non-opacified small bowel in the left mid active abdomen shortly proximal to the ileostomy. There is no evidence of an internal hernia. There is some herniation of normal-appearing distal ileum into the ostomy site. No intra-abdominal abscess is demonstrated. Contrast will likely be entering the ostomy bag shortly. 2. The colon is collapsed and exhibits no acute abnormality. 3. There is no evidence of acute hepatobiliary abnormality nor acute urinary tract abnormality. Electronically Signed By: David Swaziland On: 11/04/2013 13:35   12/28 KUB IMPRESSION:  1. Persistent partial small bowel obstruction. No free air.  12/31 Ordered  STAT Xray chest / abdomen   Saralyn Pilar, DO 11/08/2013, 9:36 AM PGY-1, [facility] Family Medicine FPTS Intern pager: [phone], text pages welcome

## 2013-11-08 NOTE — Op Note (Signed)
[name] [address] [place], [address]   OPERATIVE PROCEDURE REPORT  PATIENT: [name], [name]  MR#: #[id number] BIRTHDATE: 09-Jun-1941  GENDER: Female ENDOSCOPIST: Jeani Hawking, MD ASSISTANT:   Nilsa Nutting, Endo Technician, Dorisann Frames, technician, and Claudie Revering, RN CGRN PROCEDURE DATE: 11/08/2013 PROCEDURE:   EGD ASA CLASS:   Class III INDICATIONS: Hematemesis and severe anemia MEDICATIONS: Versed 7 mg IV and Fentanyl 75 mcg IV TOPICAL ANESTHETIC:   none  DESCRIPTION OF PROCEDURE:   After the risks benefits and alternatives of the procedure were thoroughly explained, informed consent was obtained.  The PENTAX GASTOROSCOPE [id number]  endoscope was introduced through the mouth  and advanced to the second portion of the duodenum Without limitations.      The instrument was slowly withdrawn as the mucosa was fully examined.      FINDINGS: The esophagus was normal.  No evidence of any esophagitis. In the gastric lumen a large clot was identified in the gastric body.  The antrum was normal and the duodenum was normal.  Multiple attempts were made to break the clot with a snare and suction, but this was not possible.  Maneuvering the patient did allow for visualization of a nonbleeding linear ulcer in the lesser curvature of the stomach.  There were not lesions in the fundic region.  The patient was rotated to the right lateral decubitus position to move the clot and the mucosa appeared to be normal.          The scope was then withdrawn from the patient and the procedure terminated.  COMPLICATIONS: There were no complications. IMPRESSION: 1) Lesser curvature ulcer - nonbleding. 2) Large clot.  RECOMMENDATIONS: 1) Follow HGB. 2) Transfuse as necessary. 3) PPI.  _______________________________ Rosalie DoctorJeani Hawking, MD 11/08/2013 5:52 PM

## 2013-11-08 NOTE — Progress Notes (Signed)
I have seen and examined the pt and agree with PA-Dort's progress note. GI to see No large incarcerated parastomal hernia Supportive care for now

## 2013-11-08 NOTE — Progress Notes (Signed)
Patient began vomiting blood (250cc) at 0200.  Emesis had clots in it.  NG tube placement verified and then reconnected to LIWS.  Ostomy bag putting out minimal dark black liquid stool.  Vital signs taken and MD notified of bloody emesis, ostomy output, and BP: 88/58 (manual).  Patient briefly became non-responsive and rapid response nurse, Marylene Land, and Dr.Karamalegos both came to floor.   Orders received to give NS bolus, Protonix, and draw CBC this morning.  Patient quickly began responding again with no complaints of pain.  Nausea resolved after NG tube reconnected to suction.  Will continue to monitor.

## 2013-11-08 NOTE — Progress Notes (Signed)
Subjective: Pt had some vomiting over night with hematemesis.  Yesterday morning her NG output was redish brown however it looked to be more from the red dye in the chloraseptic spray than blood.  Today it continues to be redish and her Hgb dropped to 5.1, recheck pending.  She became hypotensive SBP in the 70's.  She was given 1L bolus and emergent release of 2 units of O neg blood was administered.    Objective: Vital signs in last 24 hours: Temp:  [97.3 F (36.3 C)-99.2 F (37.3 C)] 97.3 F (36.3 C) (12/31 0538) Pulse Rate:  [69-79] 78 (12/31 0538) Resp:  [18-20] 18 (12/31 0538) BP: (88-115)/(50-58) 91/50 mmHg (12/31 0538) SpO2:  [91 %-99 %] 93 % (12/31 0538) Last BM Date: 11/07/13  Intake/Output from previous day: 12/30 0701 - 12/31 0700 In: 1747.5 [P.O.:240; I.V.:487.5; NG/GT:20] Out: 385 [Emesis/NG output:360; Stool:25] Intake/Output this shift:    PE: Gen:  Alert, NAD, pleasant Card:  RRR, no M/G/R heard Pulm:  CTA, no W/R/R Abd: soft, moderate tenderness mild distension, few BS, large healed midline scar, 1cm ileostomy pink and with flatus in bag, no output, parastomal hernia looks smaller/less bulge than at admission    Lab Results:   Recent Labs  11/06/13 0835  WBC 5.0  HGB 11.6*  HCT 35.4*  PLT 220   BMET  Recent Labs  11/07/13 0650 11/08/13 0730  NA 144 142  K 4.3 4.5  CL 113* 115*  CO2 19 18*  GLUCOSE 72 131*  BUN 35* 32*  CREATININE 1.26* 1.00  CALCIUM 7.7* 6.5*   PT/INR No results found for this basename: LABPROT, INR,  in the last 72 hours CMP     Component Value Date/Time   NA 142 11/08/2013 0730   K 4.5 11/08/2013 0730   CL 115* 11/08/2013 0730   CO2 18* 11/08/2013 0730   GLUCOSE 131* 11/08/2013 0730   BUN 32* 11/08/2013 0730   CREATININE 1.00 11/08/2013 0730   CALCIUM 6.5* 11/08/2013 0730   PROT 9.0* 11/04/2013 0845   ALBUMIN 4.2 11/04/2013 0845   AST 33 11/04/2013 0845   ALT 20 11/04/2013 0845   ALKPHOS 180* 11/04/2013  0845   BILITOT 1.2 11/04/2013 0845   GFRNONAA 56* 11/08/2013 0730   GFRAA 65* 11/08/2013 0730   Lipase     Component Value Date/Time   LIPASE 76* 01/06/2013 1638       Studies/Results: No results found.  Anti-infectives: Anti-infectives   Start     Dose/Rate Route Frequency Ordered Stop   11/05/13 1030  piperacillin-tazobactam (ZOSYN) IVPB 3.375 g  Status:  Discontinued     3.375 g 12.5 mL/hr over 240 Minutes Intravenous 3 times per day 11/05/13 6578 11/07/13 1323       Assessment/Plan Parastomal hernia  Partial small bowel obstruction  Abdominal pain  Nausea/vomiting  Leukocytosis - normalized on 12/29 AKI - normal Cr. today Febrile - afebrile ABL Anemia from hematemesis - drop in Hgb from 11 to 5, recheck H/H pending, hemetemasis, increased abdominal pain  Plan:  1. NPO, clamp tube if she can tolerate it in case it was causing some gastritis, order Abd/chest to look for free air and check SBO 2. IVF, pain control, antiemetics, on protonix BID may need to be on drip 3. Less output in bag the last 2 days, increase in pain today 4. SCD's and discontinue lovenox 5. Recommend GI consult for EGD given hematemesis.  [facility] has seen the patient in the  past they will be doing an EGD in SDU when transferred.  The NG output is frankly red, but unsure if it has a component dye from chloraseptic spray, but given drop in Hgb very likely from GI bleed.   6.  No planned surgical interventions as this is a very hostile abdomen, would need transfer to tertiary care if she wants her ostomy revised or taken down due to complexity of surgery needed and extent of adhesions  6. Hopefully pain, nausea will resolve and she can be discharged to follow up as an outpatient at tertiary center  7. Pending KUB/chest xray, given 1L NS bolus and ordered rapid release of 2 units O neg blood for administration given SBP hypotension into the 70's.  Zosyn given for 3 days then discontinued per Dr.  Perley Jain.  Attempted to call Son in law and daughter in law (parents of granddaughters in the room), but were unsuccessful in contacting them.      LOS: 4 days    Aris Georgia 11/08/2013, 8:41 AM Pager: [phone]

## 2013-11-08 NOTE — Progress Notes (Signed)
Patient transferred to 3S16 in stable condition.

## 2013-11-08 NOTE — Progress Notes (Signed)
Pt admitted to 3300 from 70 north. Oriented to unit and room. Called interpreter to translate- safety discussed and call bell with in reach. Will continue to monitor.  Elijah Birk, RN

## 2013-11-08 NOTE — Progress Notes (Addendum)
Family Practice Teaching Service Interval Progress Note  S: Paged at [phone] regarding episode of significant hematemesis (200-300cc) with clots while NGT remained in place and clamped. Pt had been allowed to take sips, inc to 60cc fluid q 1 hr. Per nurse, NGT was reconnected to light wall suction, and overall patient improved and resumed sleeping. Requested vital recheck, notified of decreased BP to 88/58 with HR 75 (previously systolic BP 100-115), rapid response called as pt had decreased responsiveness momentarily, which had resolved. On arrival, granddaughters at bedside to provide translation, patient easily wakes up to voice stimulation, minimal distress, resolved vomiting.  O: BP 88/58  Pulse 75  Temp(Src) 98.4 F (36.9 C) (Oral)  Resp 18  Ht 4\' 5"  (1.346 m)  Wt 94 lb (42.638 kg)  BMI 23.53 kg/m2  SpO2 99% Gen - sleeping, easily wakes with vocal stimulation, NAD HEENT - PERRL, EOMI, NGT in place (wall suction, note 300cc dark brown blood tinged liquid) Heart - RRR, persistent 2/6 systolic murmur Lungs - CTAB Abd - L-colostomy bag with small amt dark black-brown liquid with minimal semi-solid stool, pink mucosa at stoma, no significant tenderness, non-distended, +active BS  Ext - WWP, non-tender, moves all ext, peripheral pulses intact Neuro - on awakening, alert, oriented, grossly non-focal, intact muscle strength  A&P: Briefly, ADAMARI FREDE is a 72 y.o. female presenting with partial SBO with persistent abd pain, n/v, currently with conservative management, bowel rest, and NGT (clamped today). PMH is significant for partial colectomy with ileostomy in 2011, CAD, and CKD.  # Hematemesis Estimated 200-300cc dark-bloody emesis, since resolved. No further emesis on reconnection of NGT to wall suction. Likely irritation from NGT, however concern for possible gastric ulcer, no prior h/o cirrhosis or esophageal varices. Initial drop in systolic BP and episode of decreased responsiveness  likely related to vagal response, noted significant improvement shortly after. - remain NPO, resume NGT suction - 1L NS bolus - CBC to check H/H with next lab draw at 0500, last Hgb 11.6 (12/29) - Protonix IV 40mg  q 12 hr - consider consulting GI in AM  # Partial Small Bowel Obstruction with Abdominal Pain, N/V - Improved Continue conservative management with bowel rest at this time. Likely NGT to remain on suction today, despite previous plans for gradually advancing diet. - continue symptomatic management, anti-emetics, morphine for pain - remains afebrile, no need to resume abx  Saralyn Pilar, DO Family Medicine PGY-1 Service Pager [phone]

## 2013-11-08 NOTE — Progress Notes (Signed)
CRITICAL VALUE ALERT  Critical value received:  hemoglobin 5.3  Date of notification:  11/08/2013  Time of notification: 1017  Critical value read back:[place]  Nurse who received alert: Zuleica Seith  MD notified (1st page):  Aris Georgia, physician assistant  Time of first page:  notified face to face  MD notified (2nd page):  Time of second page:  Responding MD:  Aris Georgia, pa  Time MD responded:  notified face to face

## 2013-11-08 NOTE — Progress Notes (Signed)
FMTS Attending  Note: Effrey Davidow,MD I  have seen and examined this patient, reviewed their chart. I have discussed this patient with the resident. I agree with the resident's findings, assessment and care plan.  

## 2013-11-08 NOTE — Progress Notes (Signed)
Family Medicine Teaching Service Interim Progress Note  S: Pt feels "fine" and denies CP/SOB/nausea/vomiting/continued hematemesis. Pt's brother provided translation.   O: BP 134/78  Pulse 94  Temp(Src) 98.4 F (36.9 C) (Oral)  Resp 17  Ht 4\' 5"  (1.346 m)  Wt 94 lb (42.638 kg)  BMI 23.53 kg/m2  SpO2 100% Gen: tired-appearing 72 y.o.female in NAD HEENT: MMM, oropharynx clear Pulm: Non-labored; CTAB CV: RRR, no murmur GI: +BS; soft, non-distended, mild diffuse tenderness. Colostomy with scant dark brown output. No signs of infection.   A/P:  Pt with partial SBO undergoing conservative management with high volume hematemesis (hgb 11.6 > 5.3) found to be due to a linear ulceration of the lesser curvature of the stomach. No active bleeding on EGD, though large clot was noted and not able to be broken up. Symptoms improving s/p bolus and transfusion of 2u PRBCs. VSS.  - Recheck H & H pending - Continue NPO - Continue monitoring in SDU    Danah Reinecke B. Jarvis Newcomer, MD, PGY-1 11/08/2013 5:56 PM

## 2013-11-08 NOTE — Consult Note (Signed)
Unassigned Patient  Reason for Consult: Severe anemia Referring Physician: Teaching Service  Genelle Bal HPI: This is a 72 year old female admitted for a parastomal hernia with resultant SBO who had an episode of hematemesis last evening.  Blood work this AM revealed that her HGB dropped down to 5.3 g/dL.  She has a significant history of GI issues that resulted from a perforation of the duodenum with an attempted sphincterotomy for CBD stones.  Repair of the duodenal perforation resulted in multiple complications, namely, necrotic right colon with subsequent colostomy.  Last year she had an episode of abdominal pain and it resolved spontaneously.  During this admission her pain continued and she has been managed by the Primary team and Surgery.  No recent history of hematemesis, but in the past, an EGD was performed for hematemesis with negative findings.  Past Medical History  Diagnosis Date  . Asthma   . NSTEMI (non-ST elevated myocardial infarction) 2011    medically managed  . Paroxysmal atrial fibrillation     Hx of amiodarone use    Past Surgical History  Procedure Laterality Date  . Colostomy Left 2011    Dr. Carolynne Edouard  . Partial colectomy  2011    necrotic bowel after lap chole  . Laparoscopic cholecystectomy  2011  . Ercp  2011  . Cardiac catheterization  2011    100% occlusion RCA, 80% stenosis circumflex, 60% stenosis LAD    History reviewed. No pertinent family history.  Social History:  reports that she has never smoked. She does not have any smokeless tobacco history on file. She reports that she does not drink alcohol or use illicit drugs.  Allergies: No Known Allergies  Medications:  Scheduled: . amLODipine  5 mg Oral Daily  . enalapril  5 mg Oral BID  . metoprolol tartrate  25 mg Oral BID  . mometasone-formoterol  2 puff Inhalation BID  . pantoprazole (PROTONIX) IV  40 mg Intravenous Q12H  . simvastatin  20 mg Oral q morning - 10a   Continuous: . sodium  chloride 100 mL/hr at 11/08/13 1230    Results for orders placed during the hospital encounter of 11/04/13 (from the past 24 hour(s))  BASIC METABOLIC PANEL     Status: Abnormal   Collection Time    11/08/13  7:30 AM      Result Value Range   Sodium 142  137 - 147 mEq/L   Potassium 4.5  3.7 - 5.3 mEq/L   Chloride 115 (*) 96 - 112 mEq/L   CO2 18 (*) 19 - 32 mEq/L   Glucose, Bld 131 (*) 70 - 99 mg/dL   BUN 32 (*) 6 - 23 mg/dL   Creatinine, Ser 1.61  0.50 - 1.10 mg/dL   Calcium 6.5 (*) 8.4 - 10.5 mg/dL   GFR calc non Af Amer 56 (*) >90 mL/min   GFR calc Af Amer 65 (*) >90 mL/min  BASIC METABOLIC PANEL     Status: Abnormal   Collection Time    11/08/13  9:15 AM      Result Value Range   Sodium 144  137 - 147 mEq/L   Potassium 4.4  3.7 - 5.3 mEq/L   Chloride 116 (*) 96 - 112 mEq/L   CO2 19  19 - 32 mEq/L   Glucose, Bld 121 (*) 70 - 99 mg/dL   BUN 34 (*) 6 - 23 mg/dL   Creatinine, Ser 0.96  0.50 - 1.10 mg/dL   Calcium  6.6 (*) 8.4 - 10.5 mg/dL   GFR calc non Af Amer 51 (*) >90 mL/min   GFR calc Af Amer 60 (*) >90 mL/min  CBC     Status: Abnormal   Collection Time    11/08/13  9:15 AM      Result Value Range   WBC 10.2  4.0 - 10.5 K/uL   RBC 1.91 (*) 3.87 - 5.11 MIL/uL   Hemoglobin 5.3 (*) 12.0 - 15.0 g/dL   HCT 40.9 (*) 81.1 - 91.4 %   MCV 86.4  78.0 - 100.0 fL   MCH 27.7  26.0 - 34.0 pg   MCHC 32.1  30.0 - 36.0 g/dL   RDW 78.2  95.6 - 21.3 %   Platelets 188  150 - 400 K/uL  TYPE AND SCREEN     Status: None   Collection Time    11/08/13  9:35 AM      Result Value Range   ABO/RH(D) O POS     Antibody Screen NEG     Sample Expiration 11/11/2013     Unit Number [id number]     Blood Component Type RED CELLS,LR     Unit division 00     Status of Unit ISSUED     Unit tag comment VERBAL ORDERS PER DR MEGAN DORT     Transfusion Status OK TO TRANSFUSE     Crossmatch Result COMPATIBLE     Unit Number [id number]     Blood Component Type RED CELLS,LR     Unit division  00     Status of Unit REL FROM [facility]     Unit tag comment VERBAL ORDERS PER DR MEGAN DORT     Transfusion Status OK TO TRANSFUSE     Crossmatch Result COMPATIBLE     Unit Number [id number]     Blood Component Type RBC LR PHER1     Unit division 00     Status of Unit ISSUED     Transfusion Status OK TO TRANSFUSE     Crossmatch Result Compatible    PROTIME-INR     Status: Abnormal   Collection Time    11/08/13  9:35 AM      Result Value Range   Prothrombin Time 18.5 (*) 11.6 - 15.2 seconds   INR 1.59 (*) 0.00 - 1.49  PREPARE RBC (CROSSMATCH)     Status: None   Collection Time    11/08/13  9:35 AM      Result Value Range   Order Confirmation ORDER PROCESSED BY BLOOD BANK       Dg Chest Port 1 View  11/08/2013   CLINICAL DATA:  Pain and hematemesis  EXAM: PORTABLE CHEST - 1 VIEW  COMPARISON:  11/04/2013  FINDINGS: A nasogastric catheter is now seen within the stomach. The cardiac shadow is stable. The lungs are well-aerated without focal infiltrate. No acute bony abnormality is seen.  IMPRESSION: No change from the prior exam.   Electronically Signed   By: Alcide Clever M.D.   On: 11/08/2013 09:39   Dg Abd Portable 2v  11/08/2013   CLINICAL DATA:  Increased abdominal pain, hematemesis.  EXAM: PORTABLE ABDOMEN - 2 VIEW  COMPARISON:  11/05/2013  FINDINGS: NG tube is in the stomach. Prior cholecystectomy. Persistent small bowel distention with air-fluid levels compatible with small bowel obstruction, not significantly changed since prior study. No free air. Lung bases are clear.  IMPRESSION: Persistent small bowel obstruction pattern without real change.   Electronically  Signed   By: Charlett Nose M.D.   On: 11/08/2013 09:40    ROS:  As stated above in the HPI otherwise negative.  Blood pressure 149/45, pulse 95, temperature 98.2 F (36.8 C), temperature source Oral, resp. rate 38, height 4\' 5"  (1.346 m), weight 94 lb (42.638 kg), SpO2 99.00%.    PE: Gen: NAD, Alert and  Oriented HEENT:  [place]/AT, EOMI Neck: Supple, no LAD Lungs: CTA Bilaterally CV: RRR without M/G/R ABM: Soft, NTND, +BS Ext: No C/C/E  Assessment/Plan: 1) Hematemesis. 2) Severe anemia.  Plan: 1) EGD now.  Chrystie Hagwood D 11/08/2013, 3:17 PM

## 2013-11-08 NOTE — Progress Notes (Signed)
CRITICAL VALUE ALERT  Critical value received:  Hemoglobin 5.4  Date of notification: 10/29/13  Time of notification:  0750  Critical value read back:[place]  Nurse who received alert:  Tynisa Vohs  MD notified (1st page):  Aundra Millet dort, pa  Time of first page:  Notified face to face around 0800  MD notified (2nd page):  Time of second page:  Responding MD:  Notified megan dort, pa face to face  Time MD responded:  0800 notified face to face

## 2013-11-09 ENCOUNTER — Encounter (HOSPITAL_COMMUNITY): Payer: Self-pay | Admitting: Internal Medicine

## 2013-11-09 DIAGNOSIS — K56609 Unspecified intestinal obstruction, unspecified as to partial versus complete obstruction: Secondary | ICD-10-CM

## 2013-11-09 DIAGNOSIS — D649 Anemia, unspecified: Secondary | ICD-10-CM

## 2013-11-09 DIAGNOSIS — D62 Acute posthemorrhagic anemia: Secondary | ICD-10-CM

## 2013-11-09 DIAGNOSIS — K250 Acute gastric ulcer with hemorrhage: Secondary | ICD-10-CM

## 2013-11-09 DIAGNOSIS — K922 Gastrointestinal hemorrhage, unspecified: Secondary | ICD-10-CM

## 2013-11-09 HISTORY — DX: Acute gastric ulcer with hemorrhage: K25.0

## 2013-11-09 LAB — TYPE AND SCREEN
ABO/RH(D): O POS
Antibody Screen: NEGATIVE
Unit division: 0
Unit division: 0
Unit division: 0

## 2013-11-09 LAB — CBC
HCT: 29.9 % — ABNORMAL LOW (ref 36.0–46.0)
Hemoglobin: 10.3 g/dL — ABNORMAL LOW (ref 12.0–15.0)
MCH: 28.3 pg (ref 26.0–34.0)
MCHC: 34.4 g/dL (ref 30.0–36.0)
MCV: 82.1 fL (ref 78.0–100.0)
Platelets: 161 10*3/uL (ref 150–400)
RBC: 3.64 MIL/uL — ABNORMAL LOW (ref 3.87–5.11)
RDW: 15.3 % (ref 11.5–15.5)
WBC: 15.7 10*3/uL — ABNORMAL HIGH (ref 4.0–10.5)

## 2013-11-09 LAB — COMPREHENSIVE METABOLIC PANEL
ALT: 13 U/L (ref 0–35)
AST: 17 U/L (ref 0–37)
Albumin: 2.3 g/dL — ABNORMAL LOW (ref 3.5–5.2)
Alkaline Phosphatase: 74 U/L (ref 39–117)
BUN: 31 mg/dL — ABNORMAL HIGH (ref 6–23)
CO2: 18 mEq/L — ABNORMAL LOW (ref 19–32)
Calcium: 7.4 mg/dL — ABNORMAL LOW (ref 8.4–10.5)
Chloride: 118 mEq/L — ABNORMAL HIGH (ref 96–112)
Creatinine, Ser: 0.9 mg/dL (ref 0.50–1.10)
GFR calc Af Amer: 73 mL/min — ABNORMAL LOW (ref >90)
GFR calc non Af Amer: 63 mL/min — ABNORMAL LOW (ref >90)
Glucose, Bld: 94 mg/dL (ref 70–99)
Potassium: 4 mEq/L (ref 3.7–5.3)
Sodium: 147 mEq/L (ref 137–147)
Total Bilirubin: 0.6 mg/dL (ref 0.3–1.2)
Total Protein: 5 g/dL — ABNORMAL LOW (ref 6.0–8.3)

## 2013-11-09 NOTE — Discharge Summary (Signed)
Family Medicine Teaching [facility] Discharge Summary  Patient name: [name] Medical record number: [id number] Date of birth: [birth year] Age: 73 y.o. Gender: female Date of Admission: 11/04/2013  Date of Discharge: 11/12/13 Admitting Physician: Leighton Roach McDiarmid, MD  Primary Care Provider: Viann Shove, MD Consultants: General Surgery, GI  Indication for Hospitalization: Abdominal Pain (h/o complex abdominal surgery, ostomy), N/V, decreased ostomy output with concern for bowel obstruction  Discharge Diagnoses/Problem List:  Partial Small Bowel Obstruction, in setting of complex prior bowel resection with ostomy, resolved Acute Blood Loss Anemia, secondary to UGI bleed from gastric ulcer, resolved AKI, in setting of CKD III, resolved CAD s/p NSTEMI in 2011 HTN H/o Asthma Hyponatremia, resolved  Disposition: discharge to home  Discharge Condition: Stable  Brief Hospital Course: [name] is a 73 y.o. female presenting with abdominal pain, nausea, vomiting and decreased ostomy output, found to have a partial SBO. Course significant for acute blood loss anemia secondary to UGI bleed from gastric ulcer, since stabilized with resolution of bleeding and s/p 2u PRBC transfusion. PMH is significant for partial colectomy with ileostomy in 2011, CAD, HTN, and chronic kidney disease.  # Partial Small Bowel Obstruction, in setting of complex prior bowel resection with ostomy - Resolved  Initially presented abdominal pain, n/v, and decreased ostomy output likely due to partial SBO, demonstrated on CT Abd 12/27, received empiric coverage for potential GI source with Zosyn (after febrile to 101.9, since afebrile and BCx negative, DC'd Zosyn). Consulted General Surgery who coordinated conservative management of PSBO, especially given concerns of surgical intervention in a pt with complex abdomen, noted if surgery required pt would need to be transferred to tertiary center for  surgery. Subsequent Abd Xray 12/31 with improved but persistent PSBO, no evidence of free air. Continued with conservative PSBO management improvement with abdominal pain (transitioned off Morphine IV to Percocet PO for pain), significant increase in ostomy flatus and semi-solid stool from ostomy. Tolerated diet advancements well to soft diet prior to discharge.  # Acute Blood Loss Anemia, secondary to UGI bleed from gastric ulcer - Resolved  Significant episode hematemesis on 12/30, 200-300cc gross blood + clots, resumed NGT suction with improvement, and noted low systolic BPs (70-80s), given 1L NS bolus with good response, placed on Protonix IV (held Plavix, Lovenox), repeat Hgb showed drop from 11.6 to 5.3 and patient transferred to SDU and received transfusion 2u PRBC with post-transfusion CBC at 10.8. Consulted GI for EGD, showed a linear non-bleeding gastric ulcer with large clot unable to be removed. Suspected to be stress induced ulcer (H pylori serology negative). No further episodes hematemesis or active bleeding, trended Hgb stabilized at 8.3. Plan for GI follow-up, will hold plavix on discharge.  # CAD s/p NSTEMI in 2011  H/o cardiac cath with multiple vessel disease; no chest pain, and trop neg x3 and EKG w/o evidence of ischemia on admission. Continued home Simvastatin, Enalapril, Metoprolol, Plavix (held d/t GI bleed). As above, discontinued plavix in the setting of GI bleed on discharge.  # HTN  BP has remained well-controlled, and occasionally low, excluding period of hypotension related to GI Bleed. Continued home Amlodipine 5mg , Enalapril 5mg  BID, and Metoprolol 25mg  BID. Required to temporarily hold BP meds at times during hospitalization. Recommend re-assessing requirement for BP meds in future.   # H/o Asthma  Continued home dulera, and albuterol PRN.  # AKI, in setting of CKD III - Resolved  Baseline Cr around 1.00, GFR <50. Initially elevated Cr to  peak 1.77, with IVF  rehydration continued to improve back to baseline prior to discharge.  # Hyponatremia - Resolved  Na 131 on admission; likely hypovolemic due to vomiting and decreased PO intake. Returned to normal range.  Issues for Follow Up:   1. Recommend to Discontinue Plavix - Presented on Plavix for remote NSTEMI (2011), our recommendation that she no longer needs to take this, as risk of GI bleed is high. 2. GI Follow-up (Gastric Ulcer) - Continue PPI management, PUD appropriate diet, monitor H/H to make sure no further bleeding. Followed by Dr. Elnoria Howard and Dr. Leone Payor 3. Future Surgery Follow-up - During hospitalization, d/t complexity of previous abdominal surgeries, general surgery team strongly advised that patient would need to be at a tertiary care center if need were to arise for ostomy revision due to complexity of surgery needed and extent of intraabdominal adhesions. 4. Hypertension - amlodipine discontinued and metoprolol decreased to 12.5mg  in hospital with stable BPs in 110s/50-60s. Continue to monitor as she recovers.  Significant Procedures:  12/31 - EGD - (Dr. Elnoria Howard) 12/31 - 2u PRBC transfusion  Significant Labs and Imaging:   Recent Labs Lab 11/10/13 0320 11/11/13 0606 11/12/13 0609  WBC 14.2* 12.5* 7.2  HGB 9.6* 8.8* 8.3*  HCT 27.5* 25.4* 24.2*  PLT 146* 148* 162    Recent Labs Lab 11/08/13 0915 11/09/13 0500 11/10/13 0320 11/11/13 0606 11/12/13 0609  NA 144 147 137 137 137  K 4.4 4.0 3.6* 3.1* 3.7  CL 116* 118* 104 103 102  CO2 19 18* 21 24 25   GLUCOSE 121* 94 112* 116* 106*  BUN 34* 31* 24* 11 7  CREATININE 1.07 0.90 0.83 0.93 0.97  CALCIUM 6.6* 7.4* 7.6* 7.4* 7.5*  ALKPHOS  --  74  --   --   --   AST  --  17  --   --   --   ALT  --  13  --   --   --   ALBUMIN  --  2.3*  --   --   --     Troponin - negative x3 PT 18.5 / INR 1.59 UA (negative for infection) Urine Culture (negative, final) Blood Culture (collected 11/05/13 @ 1205) - no growth, final  12/27  CT Abdomen/Pelvis IMPRESSION:  1. The patient has apparently undergone previous right colectomy.  There has been creation of an ileostomy on the left. The small bowel  exhibits mild distention of its lumen in its proximal 2/3 but there  is a relative transition zone to non-opacified small bowel in the  left mid active abdomen shortly proximal to the ileostomy. There is  no evidence of an internal hernia. There is some herniation of  normal-appearing distal ileum into the ostomy site. No  intra-abdominal abscess is demonstrated. Contrast will likely be  entering the ostomy bag shortly.  2. The colon is collapsed and exhibits no acute abnormality.  3. There is no evidence of acute hepatobiliary abnormality nor acute  urinary tract abnormality.  12/28 KUB  IMPRESSION:  1. Persistent partial small bowel obstruction. No free air.  12/31 STAT Xray chest / abdomen  IMPRESSION:  Persistent small bowel obstruction pattern without real change  FINDINGS:  A nasogastric catheter is now seen within the stomach. The cardiac  shadow is stable. The lungs are well-aerated without focal  infiltrate. No acute bony abnormality is seen.  No change from the prior exam.  12/31 EGD  IMPRESSION:  1) Lesser curvature ulcer - nonbleeding.  2) Large clot.  Results/Tests Pending at Time of Discharge: none  Discharge Medications:    Medication List    STOP taking these medications       amLODipine 5 MG tablet  Commonly known as:  NORVASC     clopidogrel 75 MG tablet  Commonly known as:  PLAVIX      TAKE these medications       enalapril 5 MG tablet  Commonly known as:  VASOTEC  Take 5 mg by mouth 2 (two) times daily.     Fluticasone-Salmeterol 250-50 MCG/DOSE Aepb  Commonly known as:  ADVAIR  Inhale 1 puff into the lungs 2 (two) times daily.     metoCLOPramide 10 MG tablet  Commonly known as:  REGLAN  Take 10 mg by mouth at bedtime.     metoprolol tartrate 12.5 mg Tabs tablet   Commonly known as:  LOPRESSOR  Take 0.5 tablets (12.5 mg total) by mouth 2 (two) times daily.     nitroGLYCERIN 0.4 MG SL tablet  Commonly known as:  NITROSTAT  Place 0.4 mg under the tongue every 5 (five) minutes as needed for chest pain.     pantoprazole 40 MG tablet  Commonly known as:  PROTONIX  Take 1 tablet (40 mg total) by mouth daily.     simvastatin 20 MG tablet  Commonly known as:  ZOCOR  Take 20 mg by mouth every morning.        Discharge Instructions: Please refer to Patient Instructions section of EMR for full details.  Patient was counseled important signs and symptoms that should prompt return to medical care, changes in medications, dietary instructions, activity restrictions, and follow up appointments.   Follow-Up Appointments: Follow-up Information   Schedule an appointment as soon as possible for a visit with Lonia BloodGARBA,LAWAL, MD.   Specialty:  Internal Medicine   Contact information:   [address]  [place] [address] [phone]       Call CCS,MD, MD. (As needed if symptoms worsen)    Specialty:  General Surgery   Contact information:   [address] [place] [address] [phone]       Schedule an appointment as soon as possible for a visit with Siy-Hian, Whitman HeroBienvenido Chan, MD. (Call tomorrow and schedule an appointment to be seen within the next week)    Specialty:  Internal Medicine   Contact information:   [address] [place] [phone]       Tawni CarnesAndrew Layne Dilauro, MD 11/12/2013, 5:46 PM PGY-1, [facility]

## 2013-11-09 NOTE — Progress Notes (Signed)
Family Medicine Teaching Service Daily Progress Note Intern Pager: [phone]  Patient name: [name] Medical record number: [id number] Date of birth: 1941-07-29 Age: 73 y.o. Gender: female  Primary Care Provider: Viann ShoveSiy-Hian, Bienvenido Chan, MD Consultants: general surgery, GI Code Status: full  Pt Overview and Major Events to Date:  12/27 - admitted with partial SBO 12/28 - new fever to 101.9, Zosyn (Day 1), remain NPO, if surgery req will need transferred to tertiary center 12/29 - improved abd pain / nausea. NGT remains, continue NPO 12/30 - NG clamped, advance to small sips, o/n episode hematemesis --> NGT return to suction 12/31 - Hgb 11.6-->5.4 (pending confirmation repeat H/H), consult GI --> EGD 12/31 - transfer to SDU, ordered 2u PRBC --> Hgb 5.3-->10.8, NGT removed 1/1 - Hgb 10.3, stable w/o NGT, advanced diet to clear liquids, transfer SDU --> telemetry floor  Assessment and Plan: [name] is a 73 y.o. female presenting with abdominal pain, nausea, vomiting and decreased ostomy output, found to have a partial SBO. PMH is significant for partial colectomy with ileostomy in 2011, CAD, and chronic kidney disease.   # Partial Small Bowel Obstruction with Abdominal Pain, N/V - Improved Initially presented with decreased ostomy output likely due to partial SBO, demonstrated on CT Abd 12/27. Recent Abd Xray 12/31 with improved but persistent PSBO, no evidence of free air. DC'd Zosyn for empiric abx - Increased ostomy flatus with some dark black minimal output, remains afebrile (last fever 101.9 on 12/28) - NGT clamped and removed yesterday, tolerating well w/o abd pain / nausea or further vomiting Significantly improved abdominal pain, still some persistent tenderness but overall improved - Continue conservative management of PSBO - IV zofran PRN  - continue morphine IV for pain to 4 mg q 4 PRN (24 hrs req x 1 dose), today req x 2 doses - f/u blood cultures (drawn 12/28 -  NGTD > 48 hrs), Urine culture (negative final) - c/s General Surgery, appreciate all recommendations:   - continue to follow closely, improving PSBO   - concern if pt does require surgery, will need to be transferred to tertiary center d/t complexity of surgery   - discontinued NGT yesterday, tolerated well remains without NGT   - advance to clear liquid diet  # Acute Blood Loss Anemia, secondary to UGI Bleed - Resolved Significant episode hematemesis on 12/30, 200-300cc gross blood + clots, resumed NGT suction with improvement, and noted low systolic BPs (low 88), given 1L NS bolus with good response. Continued improvement of systolic BP to normal range. Currently remains in SDU, clinically stable, VSS, no further bleeding. - improved Hgb trend 11.6-->5.3--> (2u PRBC) --> 10.8-->10.3 - continue to HOLD Plavix, Lovenox - c/s GI (Dr. Elnoria HowardHung), greatly appreciate all assistance   - performed EGD (12/31) showed linear lesser curvature gastric ulcer (non-bleeding), also noticed large clot in stomach, with otherwise normal mucosa   - continue Protonix IV q 12 hr  # CAD s/p NSTEMI in 2011 cardiac cath with multiple vessel disease; Trop neg x1 and EKG w/o evidence of ischemia on admission; trop neg x 3 - Cont statin, beta blocker ACE-I - hold plavix given UGI bleed  # Asthma - reported per patient  - Cont home dulera, add albuterol PRN   # AKI, in setting of CKD III - Improved Baseline Cr around 1.00, GFR <50 - improving Cr trend 1.77-->1.26-->1.00-->1.07-->0.90  # Hyponatremia - Resolved Na 131 on admission; likely hypovolemic due to vomiting and decreased PO intake -  improving Na trend --> 133-->139-->144-->145-->147 - KVO NS IVF, inc PO intake with clear liquids  FEN/GI: clear liquid diet, KVO NS  Prophylaxis: HOLD Lovenox  Disposition: remain inpatient until partial small bowel obstruction resolves, currently SDU, transfer patient to telemetry floor today,  s/p active UGI bleed since  resolved, improved Hgb after 2u PRBC transfusion, clinical improved, advanced diet, expect 1-3 more days prior to discharge - PT/OT eval to assess if any HH needs  Subjective:  Overnight no significant events. Tolerated 24 hrs without NGT without further nausea / vomiting. Brother at bedside assisted with translation. Patient reports feeling better, improved abdominal pain, denies nausea. Increased appetite, ready to try clear liquid diet today.  Objective: Temp:  [97.8 F (36.6 C)-98.6 F (37 C)] 97.9 F (36.6 C) (01/01 0834) Pulse Rate:  [72-101] 76 (01/01 0834) Resp:  [13-38] 17 (01/01 0834) BP: (115-165)/(45-116) 157/79 mmHg (01/01 0834) SpO2:  [87 %-100 %] 95 % (01/01 0834) Physical Exam: Gen - awake, sitting up in bed, brother at bedside, NAD  HEENT - no longer has NGT, MMM Heart - RRR, persistent 2/6 systolic murmur Lungs - CTAB  Abd - improved generalized tenderness (R>L), no guarding or rebound, non-distended, +active BS, L-colostomy bag with small amt dark black-semi solid stool, pink mucosa at stoma Ext - WWP, non-tender, moves all ext, peripheral pulses intact  Neuro - on awakening, alert, oriented, grossly non-focal, intact muscle strength   Laboratory:  Recent Labs Lab 11/06/13 0835 11/08/13 0915 11/08/13 2034 11/09/13 0500  WBC 5.0 10.2  --  15.7*  HGB 11.6* 5.3* 10.8* 10.3*  HCT 35.4* 16.5* 31.3* 29.9*  PLT 220 188  --  161    Recent Labs Lab 11/04/13 0845  11/08/13 0730 11/08/13 0915 11/09/13 0500  NA 131*  < > 142 144 147  K 4.8  < > 4.5 4.4 4.0  CL 100  < > 115* 116* 118*  CO2 19  < > 18* 19 18*  BUN 21  < > 32* 34* 31*  CREATININE 1.16*  < > 1.00 1.07 0.90  CALCIUM 9.6  < > 6.5* 6.6* 7.4*  PROT 9.0*  --   --   --  5.0*  BILITOT 1.2  --   --   --  0.6  ALKPHOS 180*  --   --   --  74  ALT 20  --   --   --  13  AST 33  --   --   --  17  GLUCOSE 136*  < > 131* 121* 94  < > = values in this interval not displayed.    Imaging/Diagnostic  Tests: Ct Abdomen Pelvis W Contrast  11/04/2013 IMPRESSION: 1. The patient has apparently undergone previous right colectomy. There has been creation of an ileostomy on the left. The small bowel exhibits mild distention of its lumen in its proximal 2/3 but there is a relative transition zone to non-opacified small bowel in the left mid active abdomen shortly proximal to the ileostomy. There is no evidence of an internal hernia. There is some herniation of normal-appearing distal ileum into the ostomy site. No intra-abdominal abscess is demonstrated. Contrast will likely be entering the ostomy bag shortly. 2. The colon is collapsed and exhibits no acute abnormality. 3. There is no evidence of acute hepatobiliary abnormality nor acute urinary tract abnormality. Electronically Signed By: David Swaziland On: 11/04/2013 13:35   12/28 KUB IMPRESSION:  1. Persistent partial small bowel obstruction. No free air.  12/31  STAT Xray chest / abdomen IMPRESSION:  Persistent small bowel obstruction pattern without real change FINDINGS:  A nasogastric catheter is now seen within the stomach. The cardiac  shadow is stable. The lungs are well-aerated without focal  infiltrate. No acute bony abnormality is seen. No change from the prior exam.  12/31 EGD IMPRESSION:  1) Lesser curvature ulcer - nonbleeding.  2) Large clot.   Saralyn Pilar, DO 11/09/2013, 10:24 AM PGY-1, [facility] FPTS Intern pager: [phone], text pages welcome

## 2013-11-09 NOTE — Progress Notes (Signed)
I have seen and examined the pt and agree with PA-Osbone's progress note. No further UGIB Hgb stable OK for adv diet as tol as ostomy working well. No surgical plans at this time.

## 2013-11-09 NOTE — Progress Notes (Signed)
  [facility] Progress Note - on call for Dr, Elnoria HowardHung  Patient Name: [name] Date of Encounter: 11/09/2013, 12:49 PM    Subjective  No signs of bleeding reported, Hgb stable   Objective    Physical Exam: Filed Vitals:   11/09/13 1240  BP: 126/75  Pulse: 70  Temp: 98.3 F (36.8 C)  Resp: 14   General: NAD    Intake/Output Summary (Last 24 hours) at 11/09/13 1249 Last data filed at 11/09/13 1200  Gross per 24 hour  Intake   2480 ml  Output   1400 ml  Net   1080 ml    Labs:  Recent Labs  11/08/13 0915 11/09/13 0500  NA 144 147  K 4.4 4.0  CL 116* 118*  CO2 19 18*  GLUCOSE 121* 94  BUN 34* 31*  CREATININE 1.07 0.90  CALCIUM 6.6* 7.4*    Recent Labs  11/09/13 0500  AST 17  ALT 13  ALKPHOS 74  BILITOT 0.6  PROT 5.0*  ALBUMIN 2.3*     Recent Labs  11/08/13 0915 11/08/13 2034 11/09/13 0500  WBC 10.2  --  15.7*  HGB 5.3* 10.8* 10.3*  HCT 16.5* 31.3* 29.9*  MCV 86.4  --  82.1  PLT 188  --  161       Assessment and Plan  1) Gastric ulcer with hemorrhage 2) Acute blood loss anemia  Improved  1) Continue current Rx 2) check H. Pylori serologies though likely stress ulceration if + should tx unlessdone in past 3) Dr. Elnoria HowardHung back tomorrow   Iva Booparl E. Gessner, MD, [facility] [phone] (pager) 11/09/2013 12:52 PM

## 2013-11-09 NOTE — Progress Notes (Signed)
Patient ID: [name], female   DOB: January 31, 1941, 73 y.o.   MRN: [id number] 1 Day Post-Op  Subjective: Pt without c/o this am.  No pain, no nausea  Objective: Vital signs in last 24 hours: Temp:  [97.8 F (36.6 C)-98.6 F (37 C)] 98.5 F (36.9 C) (01/01 0504) Pulse Rate:  [72-101] 77 (01/01 0504) Resp:  [13-38] 18 (01/01 0504) BP: (104-165)/(45-116) 150/95 mmHg (01/01 0504) SpO2:  [87 %-100 %] 92 % (01/01 0504) Last BM Date: 11/07/13  Intake/Output from previous day: 12/31 0701 - 01/01 0700 In: 1870 [I.V.:1450; Blood:420] Out: 900 [Urine:875; Stool:25] Intake/Output this shift:    PE: Abd: soft, seems NT, ND, ostomy with plenty of flatus, some old dark, black trace output. Heart: regular Lungs: CTAB  Lab Results:   Recent Labs  11/08/13 0915 11/08/13 2034 11/09/13 0500  WBC 10.2  --  15.7*  HGB 5.3* 10.8* 10.3*  HCT 16.5* 31.3* 29.9*  PLT 188  --  161   BMET  Recent Labs  11/08/13 0730 11/08/13 0915  NA 142 144  K 4.5 4.4  CL 115* 116*  CO2 18* 19  GLUCOSE 131* 121*  BUN 32* 34*  CREATININE 1.00 1.07  CALCIUM 6.5* 6.6*   PT/INR  Recent Labs  11/08/13 0935  LABPROT 18.5*  INR 1.59*   CMP     Component Value Date/Time   NA 144 11/08/2013 0915   K 4.4 11/08/2013 0915   CL 116* 11/08/2013 0915   CO2 19 11/08/2013 0915   GLUCOSE 121* 11/08/2013 0915   BUN 34* 11/08/2013 0915   CREATININE 1.07 11/08/2013 0915   CALCIUM 6.6* 11/08/2013 0915   PROT 9.0* 11/04/2013 0845   ALBUMIN 4.2 11/04/2013 0845   AST 33 11/04/2013 0845   ALT 20 11/04/2013 0845   ALKPHOS 180* 11/04/2013 0845   BILITOT 1.2 11/04/2013 0845   GFRNONAA 51* 11/08/2013 0915   GFRAA 60* 11/08/2013 0915   Lipase     Component Value Date/Time   LIPASE 76* 01/06/2013 1638       Studies/Results: Dg Chest Port 1 View  11/08/2013   CLINICAL DATA:  Pain and hematemesis  EXAM: PORTABLE CHEST - 1 VIEW  COMPARISON:  11/04/2013  FINDINGS: A nasogastric catheter is now seen  within the stomach. The cardiac shadow is stable. The lungs are well-aerated without focal infiltrate. No acute bony abnormality is seen.  IMPRESSION: No change from the prior exam.   Electronically Signed   By: Alcide Clever M.D.   On: 11/08/2013 09:39   Dg Abd Portable 2v  11/08/2013   CLINICAL DATA:  Increased abdominal pain, hematemesis.  EXAM: PORTABLE ABDOMEN - 2 VIEW  COMPARISON:  11/05/2013  FINDINGS: NG tube is in the stomach. Prior cholecystectomy. Persistent small bowel distention with air-fluid levels compatible with small bowel obstruction, not significantly changed since prior study. No free air. Lung bases are clear.  IMPRESSION: Persistent small bowel obstruction pattern without real change.   Electronically Signed   By: Charlett Nose M.D.   On: 11/08/2013 09:40    Anti-infectives: Anti-infectives   Start     Dose/Rate Route Frequency Ordered Stop   11/05/13 1030  piperacillin-tazobactam (ZOSYN) IVPB 3.375 g  Status:  Discontinued     3.375 g 12.5 mL/hr over 240 Minutes Intravenous 3 times per day 11/05/13 0927 11/07/13 1323       Assessment/Plan  1. PSBO, improving 2. UGI bleed secondary to gastric ulcer 3. ABL anemia  Plan: 1.  Patient seems stable today so far.  She is ok from our standpoint to start clear liquids if ok with all other teams. 2. Cont to follow.   LOS: 5 days    Noal Abshier E 11/09/2013, 8:12 AM Pager: [phone]

## 2013-11-09 NOTE — Progress Notes (Addendum)
Pt to TX to 6N04, VSS, called report

## 2013-11-09 NOTE — Progress Notes (Signed)
Seen and examined.  Discussed with Dr. Kirtland BouchardK.  Agree with his documentation and management.  Briefly. Language barrier limits nuanced history.  As best I can tell, she is tolerating clear liquids and in less discomfort.  She seems to have quit bleeding from her EGD proven gastric ulcer.  Partial SBO appears to be resolving.  BP and pulse fine. Agree with management and transfer to tele.

## 2013-11-10 ENCOUNTER — Encounter (HOSPITAL_COMMUNITY): Payer: Self-pay | Admitting: Gastroenterology

## 2013-11-10 DIAGNOSIS — D62 Acute posthemorrhagic anemia: Secondary | ICD-10-CM

## 2013-11-10 DIAGNOSIS — Z932 Ileostomy status: Secondary | ICD-10-CM

## 2013-11-10 DIAGNOSIS — K458 Other specified abdominal hernia without obstruction or gangrene: Secondary | ICD-10-CM

## 2013-11-10 LAB — CBC
HEMATOCRIT: 27.5 % — AB (ref 36.0–46.0)
HEMOGLOBIN: 9.6 g/dL — AB (ref 12.0–15.0)
MCH: 28.7 pg (ref 26.0–34.0)
MCHC: 34.9 g/dL (ref 30.0–36.0)
MCV: 82.1 fL (ref 78.0–100.0)
Platelets: 146 10*3/uL — ABNORMAL LOW (ref 150–400)
RBC: 3.35 MIL/uL — ABNORMAL LOW (ref 3.87–5.11)
RDW: 14.8 % (ref 11.5–15.5)
WBC: 14.2 10*3/uL — ABNORMAL HIGH (ref 4.0–10.5)

## 2013-11-10 LAB — BASIC METABOLIC PANEL
BUN: 24 mg/dL — AB (ref 6–23)
CO2: 21 mEq/L (ref 19–32)
Calcium: 7.6 mg/dL — ABNORMAL LOW (ref 8.4–10.5)
Chloride: 104 mEq/L (ref 96–112)
Creatinine, Ser: 0.83 mg/dL (ref 0.50–1.10)
GFR calc Af Amer: 81 mL/min — ABNORMAL LOW (ref >90)
GFR, EST NON AFRICAN AMERICAN: 70 mL/min — AB (ref >90)
GLUCOSE: 112 mg/dL — AB (ref 70–99)
Potassium: 3.6 mEq/L — ABNORMAL LOW (ref 3.7–5.3)
Sodium: 137 mEq/L (ref 137–147)

## 2013-11-10 LAB — H. PYLORI ANTIBODY, IGG: H Pylori IgG: 0.66 {ISR}

## 2013-11-10 MED ORDER — METOPROLOL TARTRATE 25 MG PO TABS
25.0000 mg | ORAL_TABLET | Freq: Two times a day (BID) | ORAL | Status: DC
  Administered 2013-11-11: 25 mg via ORAL
  Filled 2013-11-10 (×2): qty 1

## 2013-11-10 MED ORDER — ENALAPRIL MALEATE 5 MG PO TABS
5.0000 mg | ORAL_TABLET | Freq: Two times a day (BID) | ORAL | Status: DC
  Administered 2013-11-12: 5 mg via ORAL
  Filled 2013-11-10 (×4): qty 1

## 2013-11-10 MED ORDER — DOCUSATE SODIUM 100 MG PO CAPS
100.0000 mg | ORAL_CAPSULE | Freq: Every day | ORAL | Status: DC
  Administered 2013-11-10 – 2013-11-12 (×3): 100 mg via ORAL
  Filled 2013-11-10 (×3): qty 1

## 2013-11-10 MED ORDER — MORPHINE SULFATE 2 MG/ML IJ SOLN: 1.0000 mg | INTRAMUSCULAR | Status: DC | PRN

## 2013-11-10 MED ORDER — OXYCODONE-ACETAMINOPHEN 5-325 MG PO TABS
1.0000 | ORAL_TABLET | ORAL | Status: DC | PRN
  Administered 2013-11-10 (×2): 1 via ORAL
  Filled 2013-11-10 (×2): qty 1

## 2013-11-10 MED ORDER — PANTOPRAZOLE SODIUM 40 MG PO TBEC
40.0000 mg | DELAYED_RELEASE_TABLET | Freq: Every day | ORAL | Status: DC
  Administered 2013-11-10 – 2013-11-12 (×3): 40 mg via ORAL
  Filled 2013-11-10 (×4): qty 1

## 2013-11-10 NOTE — Progress Notes (Signed)
Seen and examined.  Agree with Dr. Kirtland BouchardK.  It is gratifying that she is making nice progress at this point.

## 2013-11-10 NOTE — Evaluation (Signed)
Physical Therapy Evaluation Patient Details Name: Genelle BalGanga M Jump MRN: [id number] DOB: 1941/10/19 Today's Date: 11/10/2013 Time: [id number] PT Time Calculation (min): 26 min  PT Assessment / Plan / Recommendation History of Present Illness  Pt with partial SBO undergoing conservative management with high volume hematemesis (hgb 11.6 > 5.3) found to be due to a linear ulceration of the lesser curvature of the stomach.  Clinical Impression  Patient is moving well, received getting OOB and using restroom, attempted to obtain home setup from patient and son. Patient did report 3 steps. Able to negotiate stairs without difficulty. Patient ambulated well and performed various activities at counter and EOB without difficulty. Feel patient will be safe for dc home. No further acute PT services required. Will sign off.    PT Assessment  Patent does not need any further PT services    Follow Up Recommendations  No PT follow up    Does the patient have the potential to tolerate intense rehabilitation      Barriers to Discharge        Equipment Recommendations  None recommended by PT    Recommendations for Other Services     Frequency      Precautions / Restrictions Restrictions Weight Bearing Restrictions: No   Pertinent Vitals/Pain Pain reported, but unable to quantify secondary to language barrier. SpO2 >96% at all times      Mobility  Bed Mobility Bed Mobility: Supine to Sit;Sitting - Scoot to Edge of Bed;Sit to Supine Supine to Sit: 5: Supervision Sitting - Scoot to Edge of Bed: 5: Supervision Sit to Supine: 5: Supervision Transfers Transfers: Sit to Stand;Stand to Sit Sit to Stand: 6: Modified independent (Device/Increase time) Stand to Sit: 6: Modified independent (Device/Increase time) Details for Transfer Assistance: Increased time to perform Ambulation/Gait Ambulation/Gait Assistance: 5: Supervision Ambulation Distance (Feet): 410 Feet Assistive device: None Gait  Pattern: Within Functional Limits Gait velocity: decreased Stairs: Yes Stairs Assistance: 5: Supervision Stair Management Technique: One rail Right;Alternating pattern;Forwards Number of Stairs: 5       PT Goals(Current goals can be found in the care plan section) Acute Rehab PT Goals PT Goal Formulation: No goals set, d/c therapy  Visit Information  Last PT Received On: 11/10/13 Assistance Needed: +1 History of Present Illness: Pt with partial SBO undergoing conservative management with high volume hematemesis (hgb 11.6 > 5.3) found to be due to a linear ulceration of the lesser curvature of the stomach.       Prior Functioning  Home Living Family/patient expects to be discharged to:: Private residence Living Arrangements: Children Available Help at Discharge: Family Home Access: Stairs to enter Secretary/administratorntrance Stairs-Number of Steps: 3 Additional Comments: home setup difficult to obtain secondary to languarge barrier pt and son Prior Function Level of Independence: Independent Communication Communication: Prefers language other than English Dominant Hand: Right    Cognition  Cognition Arousal/Alertness: Awake/alert Behavior During Therapy: WFL for tasks assessed/performed    Extremity/Trunk Assessment Upper Extremity Assessment Upper Extremity Assessment: Overall WFL for tasks assessed Lower Extremity Assessment Lower Extremity Assessment: Overall WFL for tasks assessed   Balance High Level Balance High Level Balance Activites: Side stepping;Backward walking;Direction changes;Turns High Level Balance Comments: no assist needed  End of Session PT - End of Session Equipment Utilized During Treatment: Gait belt Activity Tolerance: Patient tolerated treatment well Patient left: in bed;with call bell/phone within reach;with family/visitor present Nurse Communication: Mobility status  GP     Fabio AsaWerner, Omran Keelin J 11/10/2013, 10:28 AM  Charlotte Crumbevon Jash Wahlen,  PT DPT  [phone]

## 2013-11-10 NOTE — Care Management Note (Signed)
  Page 1 of 1   11/10/2013     11:47:13 AM   CARE MANAGEMENT NOTE 11/10/2013  Patient:  [name],[name]   Account Number:  [id number]  Date Initiated:  11/10/2013  Documentation initiated by:  Ronny FlurryWILE,Zaccheaus Storlie  Subjective/Objective Assessment:     Action/Plan:   Anticipated DC Date:  11/10/2013   Anticipated DC Plan:  HOME/SELF CARE         Choice offered to / List presented to:             Status of service:  Completed, signed off Medicare Important Message given?   (If response is "NO", the following Medicare IM given date fields will be blank) Date Medicare IM given:   Date Additional Medicare IM given:    Discharge Disposition:    Per UR Regulation:    If discussed at Long Length of Stay Meetings, dates discussed:    Comments:  11-10-13 PT recommendations No PT follow up no DME Ronny FlurryHeather Carlissa Pesola RN BSN [phone]

## 2013-11-10 NOTE — Progress Notes (Signed)
RD consult for diet education regarding GERD/PUD diet recommendations.  Pt does not speak [place]. Interpreter not available at this time and per RN family speaks English but, they just left.  RD left " Gastroesophageal Reflux Disease Nutrition Therapy" handout from the [employer] at bedside. RD name and contact information provided. Please re-consult or page RD when family available in patient room.  Ian Malkineanne Barnett RD, LDN Inpatient Clinical Dietitian Pager: [phone] After Hours Pager: [phone]

## 2013-11-10 NOTE — Progress Notes (Signed)
Family Medicine Teaching Service Daily Progress Note Intern Pager: [phone]  Patient name: [name] Medical record number: [id number] Date of birth: [birth year] Age: 73 y.o. Gender: female  Primary Care Provider: Viann Shove, MD Consultants: general surgery, GI Code Status: full  Pt Overview and Major Events to Date:  12/27 - admitted with partial SBO 12/28 - new fever to 101.9, Zosyn (Day 1), remain NPO, if surgery req will need transferred to tertiary center 12/29 - improved abd pain / nausea. NGT remains, continue NPO 12/30 - NG clamped, advance to small sips, o/n episode hematemesis --> NGT return to suction 12/31 - Hgb 11.6-->5.4 (pending confirmation repeat H/H), consult GI --> EGD 12/31 - transfer to SDU, ordered 2u PRBC --> Hgb 5.3-->10.8, NGT removed 1/1 - Hgb 10.3, stable w/o NGT, advanced diet to clear liquids, transfer SDU --> telemetry floor 1/2 - decreased Hgb to 9.6, tolerating clear liquid diet, VSS  Assessment and Plan: [name] is a 73 y.o. female presenting with abdominal pain, nausea, vomiting and decreased ostomy output, found to have a partial SBO. Course significant for acute blood loss anemia secondary to UGI bleed from gastric ulcer, since stabilized with resolution of bleeding and s/p 2u PRBC transfusion. PMH is significant for partial colectomy with ileostomy in 2011, CAD, and chronic kidney disease.   # Partial Small Bowel Obstruction, in setting of complex prior bowel resection with ostomy - Improved Initially presented abdominal pain, n/v, and decreased ostomy output likely due to partial SBO, demonstrated on CT Abd 12/27, received empiric coverage for potential GI source with Zosyn (after febrile to 101.9, since afebrile and DC'd Zosyn). Subsequent Abd Xray 12/31 with improved but persistent PSBO, no evidence of free air. Significantly improved abdominal pain, still some persistent tenderness but overall improved. Continued increased  ostomy flatus with significant increase in semi-solid dark black stool in ostomy, remains afebrile x 5 days. Tolerated clear liquid diet well (x 48 hr removal of NGT) - Continue conservative management of PSBO - IV zofran PRN - Pain management:     - transition to from IV to PO pain medicine today, (24 hr req Morphine IV 12mg  = Oxycodone 24 mg)     - Percocet 5/325 q 4 PRN pain     - Morphine IV 1-2mg  q 2 hr PRN breakthrough pain     - Colace daily - f/u blood cultures (drawn 12/28 - NGTD x 5 days), Urine culture (negative final) - c/s General Surgery, appreciate all recommendations:   - continue to follow closely, improving PSBO with functioning ostomy   - advance diet to full liquids, plan for dysphagia 3 soft diet in AM   - No current plans for surgery. Unlikely, but if required, need transfer to tertiary center d/t complexity of surgery  # Acute Blood Loss Anemia, secondary to UGI bleed from gastric ulcer - Resolved Significant episode hematemesis on 12/30, 200-300cc gross blood + clots, resumed NGT suction with improvement, and noted low systolic BPs (low 88), given 1L NS bolus with good response, transferred to SDU x 24 hrs, since returned to telemetry floor. Continued clinical improvement, improved Hgb s/p 2u PRBC, stable BP, suspect no further bleeding, believe that decrease in Hgb attributed to equilibration, and except to remain stable. - Note slight decrease in Hgb trend 11.6-->5.3--> (2u PRBC) --> 10.8-->10.3-->9.6 - continue to HOLD Plavix, Lovenox - transition Protonix IV to PO today (will need to continue on discharge) - f/u  - c/s GI, greatly appreciate all  assistance   - performed EGD (12/31) showed linear lesser curvature gastric ulcer (non-bleeding), also noticed large clot in stomach, with otherwise normal mucosa   - suspect stress induced gastric ulcer, however check H. Pylori serology (pending)   - continue Protonix   - concern for possibility of continued bleeding, IF  hgb continues to decrease or active bleeding signs, plan to repeat EGD  # CAD s/p NSTEMI in 2011 H/o cardiac cath with multiple vessel disease; Trop neg x3 and EKG w/o evidence of ischemia on admission - Cont statin, beta blocker ACE-I - hold plavix given UGI bleed  # H/o Asthma - Cont home dulera, add albuterol PRN   # AKI, in setting of CKD III - Resolved Baseline Cr around 1.00, GFR <50 - improving Cr trend 1.77-->1.26-->1.00-->1.07-->0.90-->0.83 - received IVF hydration, now decreased with inc PO intake  # Hyponatremia - Resolved Na 131 on admission; likely hypovolemic due to vomiting and decreased PO intake - improving Na trend --> 133-->139-->144-->145-->147-->137 - KVO NS IVF, inc PO intake with clear liquids  FEN/GI: clear liquid diet, KVO NS  Prophylaxis: SCDs, HOLD Lovenox  Disposition: remain inpatient until partial small bowel obstruction resolves, currently on telemetry floor, hospital course complicated by active UGI bleed from gastric ulcer, since resolved, improved Hgb after 2u PRBC transfusion, clinical improved, advanced diet, potential to be discharged to home tomorrow (1-2 days). - PT/OT eval to assess functional status and if any HH needs  Subjective:  Overnight no significant events. Tolerated 48 hrs without NGT and clear liquid diet well w/o further nausea / vomiting. Family at bedside assisted with translation. Patient reports continues to feel better today vs yesterday, minimal abdominal pain. Increased appetite, ready to try advance to full liquid diet. Significant dark semi solid ostomy output. Ambulate to bathroom without concern.  Objective: Temp:  [98.3 F (36.8 C)-99 F (37.2 C)] 98.4 F (36.9 C) (01/02 0624) Pulse Rate:  [70-89] 77 (01/02 0624) Resp:  [14-20] 20 (01/02 0624) BP: (117-134)/(65-80) 134/79 mmHg (01/02 0624) SpO2:  [94 %-98 %] 96 % (01/02 1026) Weight:  [94 lb 12.8 oz (43 kg)] 94 lb 12.8 oz (43 kg) (01/01 1701) Physical Exam: Gen -  awake, pleasant, and cooperative, resting in bed, brother at bedside, NAD  Heart - RRR, persistent 2/6 systolic murmur Lungs - CTAB  Abd - significantly improved generalized tenderness (minimal) w/o guarding or rebound, non-distended, +active BS, L-colostomy bag with significant amt dark black-semi solid stool, pink mucosa at stoma Ext - WWP, non-tender, moves all ext, peripheral pulses intact  Neuro - awake, alert, oriented, grossly non-focal, intact muscle strength   Laboratory:  Recent Labs Lab 11/08/13 0915 11/08/13 2034 11/09/13 0500 11/10/13 0320  WBC 10.2  --  15.7* 14.2*  HGB 5.3* 10.8* 10.3* 9.6*  HCT 16.5* 31.3* 29.9* 27.5*  PLT 188  --  161 146*    Recent Labs Lab 11/04/13 0845  11/08/13 0915 11/09/13 0500 11/10/13 0320  NA 131*  < > 144 147 137  K 4.8  < > 4.4 4.0 3.6*  CL 100  < > 116* 118* 104  CO2 19  < > 19 18* 21  BUN 21  < > 34* 31* 24*  CREATININE 1.16*  < > 1.07 0.90 0.83  CALCIUM 9.6  < > 6.6* 7.4* 7.6*  PROT 9.0*  --   --  5.0*  --   BILITOT 1.2  --   --  0.6  --   ALKPHOS 180*  --   --  74  --   ALT 20  --   --  13  --   AST 33  --   --  17  --   GLUCOSE 136*  < > 121* 94 112*  < > = values in this interval not displayed.    Imaging/Diagnostic Tests: Ct Abdomen Pelvis W Contrast  11/04/2013 IMPRESSION: 1. The patient has apparently undergone previous right colectomy. There has been creation of an ileostomy on the left. The small bowel exhibits mild distention of its lumen in its proximal 2/3 but there is a relative transition zone to non-opacified small bowel in the left mid active abdomen shortly proximal to the ileostomy. There is no evidence of an internal hernia. There is some herniation of normal-appearing distal ileum into the ostomy site. No intra-abdominal abscess is demonstrated. Contrast will likely be entering the ostomy bag shortly. 2. The colon is collapsed and exhibits no acute abnormality. 3. There is no evidence of acute  hepatobiliary abnormality nor acute urinary tract abnormality. Electronically Signed By: David SwazilandJordan On: 11/04/2013 13:35   12/28 KUB IMPRESSION:  1. Persistent partial small bowel obstruction. No free air.  12/31 STAT Xray chest / abdomen IMPRESSION:  Persistent small bowel obstruction pattern without real change FINDINGS:  A nasogastric catheter is now seen within the stomach. The cardiac  shadow is stable. The lungs are well-aerated without focal  infiltrate. No acute bony abnormality is seen. No change from the prior exam.  12/31 EGD IMPRESSION:  1) Lesser curvature ulcer - nonbleeding.  2) Large clot.   Saralyn PilarAlexander Karamalegos, DO 11/10/2013, 11:46 AM PGY-1, [facility] FPTS Intern pager: [phone], text pages welcome

## 2013-11-10 NOTE — Progress Notes (Signed)
2 Days Post-Op  Subjective: Pt feels much better, no longer nauseated and vomiting.  NG taken out on 31st.  Tolerating clears.  Having BM's and flatus in bag, but is very dark black.    Objective: Vital signs in last 24 hours: Temp:  [97.9 F (36.6 C)-99 F (37.2 C)] 98.4 F (36.9 C) (01/02 0624) Pulse Rate:  [70-89] 77 (01/02 0624) Resp:  [14-20] 20 (01/02 0624) BP: (117-157)/(65-80) 134/79 mmHg (01/02 0624) SpO2:  [93 %-98 %] 98 % (01/02 0624) Weight:  [94 lb 12.8 oz (43 kg)] 94 lb 12.8 oz (43 kg) (01/01 1701) Last BM Date: 11/07/13  Intake/Output from previous day: 01/01 0701 - 01/02 0700 In: 1340 [P.O.:840; I.V.:500] Out: 501 [Urine:501] Intake/Output this shift:    PE: Gen:  Alert, NAD, pleasant Abd: soft, NT/ND, +BS, large healed midline scar, 1cm ileostomy pink and with flatus in bag, dark black foamy output in ostomy, parastomal hernia appears much smaller   Lab Results:   Recent Labs  11/09/13 0500 11/10/13 0320  WBC 15.7* 14.2*  HGB 10.3* 9.6*  HCT 29.9* 27.5*  PLT 161 146*   BMET  Recent Labs  11/09/13 0500 11/10/13 0320  NA 147 137  K 4.0 3.6*  CL 118* 104  CO2 18* 21  GLUCOSE 94 112*  BUN 31* 24*  CREATININE 0.90 0.83  CALCIUM 7.4* 7.6*   PT/INR  Recent Labs  11/08/13 0935  LABPROT 18.5*  INR 1.59*   CMP     Component Value Date/Time   NA 137 11/10/2013 0320   K 3.6* 11/10/2013 0320   CL 104 11/10/2013 0320   CO2 21 11/10/2013 0320   GLUCOSE 112* 11/10/2013 0320   BUN 24* 11/10/2013 0320   CREATININE 0.83 11/10/2013 0320   CALCIUM 7.6* 11/10/2013 0320   PROT 5.0* 11/09/2013 0500   ALBUMIN 2.3* 11/09/2013 0500   AST 17 11/09/2013 0500   ALT 13 11/09/2013 0500   ALKPHOS 74 11/09/2013 0500   BILITOT 0.6 11/09/2013 0500   GFRNONAA 70* 11/10/2013 0320   GFRAA 81* 11/10/2013 0320   Lipase     Component Value Date/Time   LIPASE 76* 01/06/2013 1638       Studies/Results: Dg Chest Port 1 View  11/08/2013   CLINICAL DATA:  Pain and hematemesis  EXAM:  PORTABLE CHEST - 1 VIEW  COMPARISON:  11/04/2013  FINDINGS: A nasogastric catheter is now seen within the stomach. The cardiac shadow is stable. The lungs are well-aerated without focal infiltrate. No acute bony abnormality is seen.  IMPRESSION: No change from the prior exam.   Electronically Signed   By: Alcide CleverMark  Lukens M.D.   On: 11/08/2013 09:39   Dg Abd Portable 2v  11/08/2013   CLINICAL DATA:  Increased abdominal pain, hematemesis.  EXAM: PORTABLE ABDOMEN - 2 VIEW  COMPARISON:  11/05/2013  FINDINGS: NG tube is in the stomach. Prior cholecystectomy. Persistent small bowel distention with air-fluid levels compatible with small bowel obstruction, not significantly changed since prior study. No free air. Lung bases are clear.  IMPRESSION: Persistent small bowel obstruction pattern without real change.   Electronically Signed   By: Charlett NoseKevin  Dover M.D.   On: 11/08/2013 09:40    Anti-infectives: Anti-infectives   Start     Dose/Rate Route Frequency Ordered Stop   11/05/13 1030  piperacillin-tazobactam (ZOSYN) IVPB 3.375 g  Status:  Discontinued     3.375 g 12.5 mL/hr over 240 Minutes Intravenous 3 times per day 11/05/13 [id number] 11/07/13 1323  Assessment/Plan Parastomal hernia  Partial small bowel obstruction - resolved Abdominal pain  Nausea/vomiting  Leukocytosis - 14.2 today AKI - normal Cr. today  Febrile - afebrile  ABL Anemia from Gastric ulcer/hematemesis - Hgb was 5.1 no up to 9.6 after 2units PRBC  1. Advance to fulls, soft diet tomorrow am if tolerating 2. IVF, pain control, antiemetics, bowel rest  3. Hgb dropped some, would re-eval tomorrow 4. Ambulate, IS, SCD's and lovenox  5. No planned surgical interventions as this is a very hostile abdomen, would need transfer to tertiary care if she wants her ostomy revised or taken down due to complexity of surgery needed and extent of adhesions  6. Pain and nausea resolving.  Hopefully she can be discharged tomorrow if Hgb is stable and  pain resolved to follow up as an outpatient at tertiary center  7.  Consulted dietitian today to give nutrition recommendations 8.  Switch to PO protonix? 9.  PT/OT haven't worked with patient yet, hopefully eval today    LOS: 6 days    [name], [name] 11/10/2013, 8:22 AM Pager: [phone]

## 2013-11-10 NOTE — Progress Notes (Signed)
Subjective: No acute events.  Family member reports that she does not have any complaints.  Objective: Vital signs in last 24 hours: Temp:  [97.9 F (36.6 C)-99 F (37.2 C)] 98.4 F (36.9 C) (01/02 0624) Pulse Rate:  [70-89] 77 (01/02 0624) Resp:  [14-20] 20 (01/02 0624) BP: (117-157)/(65-80) 134/79 mmHg (01/02 0624) SpO2:  [93 %-98 %] 98 % (01/02 0624) Weight:  [94 lb 12.8 oz (43 kg)] 94 lb 12.8 oz (43 kg) (01/01 1701) Last BM Date: 11/07/13  Intake/Output from previous day: 01/01 0701 - 01/02 0700 In: 1340 [P.O.:840; I.V.:500] Out: 501 [Urine:501] Intake/Output this shift:    General appearance: alert and no distress GI: soft, non-tender; bowel sounds normal; no masses,  no organomegaly  Lab Results:  Recent Labs  11/08/13 0915 11/08/13 2034 11/09/13 0500 11/10/13 0320  WBC 10.2  --  15.7* 14.2*  HGB 5.3* 10.8* 10.3* 9.6*  HCT 16.5* 31.3* 29.9* 27.5*  PLT 188  --  161 146*   BMET  Recent Labs  11/08/13 0915 11/09/13 0500 11/10/13 0320  NA 144 147 137  K 4.4 4.0 3.6*  CL 116* 118* 104  CO2 19 18* 21  GLUCOSE 121* 94 112*  BUN 34* 31* 24*  CREATININE 1.07 0.90 0.83  CALCIUM 6.6* 7.4* 7.6*   LFT  Recent Labs  11/09/13 0500  PROT 5.0*  ALBUMIN 2.3*  AST 17  ALT 13  ALKPHOS 74  BILITOT 0.6   PT/INR  Recent Labs  11/08/13 0935  LABPROT 18.5*  INR 1.59*   Hepatitis Panel No results found for this basename: HEPBSAG, HCVAB, HEPAIGM, HEPBIGM,  in the last 72 hours C-Diff No results found for this basename: CDIFFTOX,  in the last 72 hours Fecal Lactopherrin No results found for this basename: FECLLACTOFRN,  in the last 72 hours  Studies/Results: Dg Chest Port 1 View  11/08/2013   CLINICAL DATA:  Pain and hematemesis  EXAM: PORTABLE CHEST - 1 VIEW  COMPARISON:  11/04/2013  FINDINGS: A nasogastric catheter is now seen within the stomach. The cardiac shadow is stable. The lungs are well-aerated without focal infiltrate. No acute bony  abnormality is seen.  IMPRESSION: No change from the prior exam.   Electronically Signed   By: Alcide CleverMark  Lukens M.D.   On: 11/08/2013 09:39   Dg Abd Portable 2v  11/08/2013   CLINICAL DATA:  Increased abdominal pain, hematemesis.  EXAM: PORTABLE ABDOMEN - 2 VIEW  COMPARISON:  11/05/2013  FINDINGS: NG tube is in the stomach. Prior cholecystectomy. Persistent small bowel distention with air-fluid levels compatible with small bowel obstruction, not significantly changed since prior study. No free air. Lung bases are clear.  IMPRESSION: Persistent small bowel obstruction pattern without real change.   Electronically Signed   By: Charlett NoseKevin  Dover M.D.   On: 11/08/2013 09:40    Medications:  Scheduled: . amLODipine  5 mg Oral Daily  . docusate sodium  100 mg Oral Daily  . enalapril  5 mg Oral BID  . metoprolol tartrate  25 mg Oral BID  . mometasone-formoterol  2 puff Inhalation BID  . pantoprazole (PROTONIX) IV  40 mg Intravenous Q12H  . simvastatin  20 mg Oral q morning - 10a   Continuous: . sodium chloride 10 mL/hr at 11/09/13 1100    Assessment/Plan: 1) Gastric ulcer. 2) Anemia. 3) Parastomal hernia with SBO   Her HGB has dropped down, but not significantly.  I am uncertain if this represents continued bleeding.  Her her HGB  continues to trend downwards and/or overt signs of bleeding a repeat EGD will be required.  She did have a large clot and another bleeding site could be missed despite having adequate views of the mucosa.  Plan: 1) Continue with Protonix. 2) Follow HGB and transfuse as necessary.   LOS: 6 days   Cherye Gaertner D 11/10/2013, 7:48 AM

## 2013-11-10 NOTE — Progress Notes (Signed)
Slowly advance diet. Mobilize with PT/OT  Wilmon ArmsMatthew K. Corliss Skainssuei, MD, [facility]  General/ Trauma Surgery  11/10/2013 12:01 PM

## 2013-11-10 NOTE — Evaluation (Signed)
Occupational Therapy Evaluation Patient Details Name: [name] MRN: [id number] DOB: Aug 09, 1941 Today's Date: 11/10/2013 Time: 9629-5284 OT Time Calculation (min): 17 min  OT Assessment / Plan / Recommendation History of present illness Pt with partial SBO undergoing conservative management with high volume hematemesis (hgb 11.6 > 5.3) found to be due to a linear ulceration of the lesser curvature of the stomach.   Clinical Impression   Patient evaluated by Occupational Therapy with no further acute OT needs identified. All education has been completed and the patient has no further questions. Son and family to assist at home if needed.  Recommended that family be with her when she is on her feet when she first goes home.  No follow-up Occupational Therapy or equipment recommended. OT is signing off. Thank you for this referral.    OT Assessment  Patient does not need any further OT services    Follow Up Recommendations  No OT follow up    Equipment Recommendations  None recommended by OT    Precautions / Restrictions Restrictions Weight Bearing Restrictions: No   Pertinent Vitals/Pain Denies pain    ADL  Grooming: Performed Where Assessed - Grooming: Unsupported standing Lower Body Bathing: Simulated;Set up Where Assessed - Lower Body Bathing: Unsupported standing;Unsupported sitting Lower Body Dressing: Simulated;Set up Where Assessed - Lower Body Dressing: Unsupported standing;Unsupported sitting Toilet Transfer: Dentist: Comfort height toilet Toileting - Architect and Hygiene: Performed;Supervision/safety Where Assessed - Engineer, mining and Hygiene: Standing;Sit on 3-in-1 or toilet Tub/Shower Transfer Method: Not assessed Transfers/Ambulation Related to ADLs: One small LOB ambulating without AD yet was able to recover without assistance.  Son providing assist with his mother even when she does  not need it and reports he and other family will help at home     Visit Information  Last OT Received On: 11/10/13 Assistance Needed: +1 History of Present Illness: Pt with partial SBO undergoing conservative management with high volume hematemesis (hgb 11.6 > 5.3) found to be due to a linear ulceration of the lesser curvature of the stomach.       Prior Functioning     Home Living Family/patient expects to be discharged to:: Private residence Living Arrangements: Children Available Help at Discharge: Family Home Access: Stairs to enter Entrance Stairs-Number of Steps: 3 Additional Comments: home setup difficult to obtain secondary to languarge barrier pt and son.  Son indicates that patient sponge bathes and occasionally sits in bottom of tub.   Prior Function Level of Independence: Independent Communication Communication: Prefers language other than English    Cognition  Cognition Arousal/Alertness: Awake/alert Behavior During Therapy: WFL for tasks assessed/performed Overall Cognitive Status: Difficult to assess Difficult to assess due to: Non-English speaking    Extremity/Trunk Assessment Upper Extremity Assessment Upper Extremity Assessment: Overall WFL for tasks assessed Lower Extremity Assessment Lower Extremity Assessment: Defer to PT evaluation     Mobility Bed Mobility Bed Mobility: Supine to Sit;Sitting - Scoot to Edge of Bed;Sit to Supine Supine to Sit: 6: Modified independent (Device/Increase time) Sitting - Scoot to Edge of Bed: 6: Modified independent (Device/Increase time) Sit to Supine: 6: Modified independent (Device/Increase time) Transfers Sit to Stand: 6: Modified independent (Device/Increase time) Stand to Sit: 6: Modified independent (Device/Increase time) Details for Transfer Assistance: Increased time to perform     End of Session OT - End of Session Activity Tolerance: Patient tolerated treatment well Patient left: in bed;with call  bell/phone within reach;with family/visitor present  GO  Elisandro Jarrett 11/10/2013, 4:14 PM

## 2013-11-11 DIAGNOSIS — I4891 Unspecified atrial fibrillation: Secondary | ICD-10-CM

## 2013-11-11 DIAGNOSIS — E785 Hyperlipidemia, unspecified: Secondary | ICD-10-CM

## 2013-11-11 LAB — CBC
HCT: 25.4 % — ABNORMAL LOW (ref 36.0–46.0)
Hemoglobin: 8.8 g/dL — ABNORMAL LOW (ref 12.0–15.0)
MCH: 28.4 pg (ref 26.0–34.0)
MCHC: 34.6 g/dL (ref 30.0–36.0)
MCV: 81.9 fL (ref 78.0–100.0)
Platelets: 148 10*3/uL — ABNORMAL LOW (ref 150–400)
RBC: 3.1 MIL/uL — ABNORMAL LOW (ref 3.87–5.11)
RDW: 14.7 % (ref 11.5–15.5)
WBC: 12.5 10*3/uL — ABNORMAL HIGH (ref 4.0–10.5)

## 2013-11-11 LAB — BASIC METABOLIC PANEL
BUN: 11 mg/dL (ref 6–23)
CALCIUM: 7.4 mg/dL — AB (ref 8.4–10.5)
CO2: 24 mEq/L (ref 19–32)
Chloride: 103 mEq/L (ref 96–112)
Creatinine, Ser: 0.93 mg/dL (ref 0.50–1.10)
GFR calc Af Amer: 71 mL/min — ABNORMAL LOW (ref >90)
GFR, EST NON AFRICAN AMERICAN: 61 mL/min — AB (ref >90)
GLUCOSE: 116 mg/dL — AB (ref 70–99)
Potassium: 3.1 mEq/L — ABNORMAL LOW (ref 3.7–5.3)
SODIUM: 137 meq/L (ref 137–147)

## 2013-11-11 LAB — CULTURE, BLOOD (ROUTINE X 2)
Culture: NO GROWTH
Culture: NO GROWTH

## 2013-11-11 MED ORDER — POTASSIUM CHLORIDE CRYS ER 10 MEQ PO TBCR
40.0000 meq | EXTENDED_RELEASE_TABLET | Freq: Two times a day (BID) | ORAL | Status: AC
  Administered 2013-11-11 (×2): 40 meq via ORAL
  Filled 2013-11-11 (×2): qty 4

## 2013-11-11 MED ORDER — METOPROLOL TARTRATE 12.5 MG HALF TABLET
12.5000 mg | ORAL_TABLET | Freq: Two times a day (BID) | ORAL | Status: DC
  Administered 2013-11-12: 12.5 mg via ORAL
  Filled 2013-11-11 (×2): qty 1

## 2013-11-11 NOTE — Progress Notes (Signed)
3 Days Post-Op  Subjective: No new problems or complaints.No nausea or vomiting. Tolerating diet.Good ileostomy output.Looks more breakdown. Non-melanotic.  Hemoglobin 8.8. Was 9.6  24 hours ago. Objective: Vital signs in last 24 hours: Temp:  [98.3 F (36.8 C)-99.8 F (37.7 C)] 99.8 F (37.7 C) (01/03 0524) Pulse Rate:  [73-95] 95 (01/03 1023) Resp:  [19-20] 20 (01/03 0524) BP: (103-119)/(52-75) 107/58 mmHg (01/03 1023) SpO2:  [61 %-96 %] 95 % (01/03 1023) Last BM Date: 11/10/13  Intake/Output from previous day:   Intake/Output this shift:    General appearance: alert. No distress. Pleasant and cooperative. GI: abdomen soft. Nondistended. Nontender. Active bowel sounds. Midline scar. Ileostomy healthy with bounds of liquid stool in bag.No significant palpable hernia.  Lab Results:   Recent Labs  11/10/13 0320 11/11/13 0606  WBC 14.2* 12.5*  HGB 9.6* 8.8*  HCT 27.5* 25.4*  PLT 146* 148*   BMET  Recent Labs  11/10/13 0320 11/11/13 0606  NA 137 137  K 3.6* 3.1*  CL 104 103  CO2 21 24  GLUCOSE 112* 116*  BUN 24* 11  CREATININE 0.83 0.93  CALCIUM 7.6* 7.4*   PT/INR No results found for this basename: LABPROT, INR,  in the last 72 hours ABG No results found for this basename: PHART, PCO2, PO2, HCO3,  in the last 72 hours  Studies/Results: No results found.  Anti-infectives: Anti-infectives   Start     Dose/Rate Route Frequency Ordered Stop   11/05/13 1030  piperacillin-tazobactam (ZOSYN) IVPB 3.375 g  Status:  Discontinued     3.375 g 12.5 mL/hr over 240 Minutes Intravenous 3 times per day 11/05/13 [id number] 11/07/13 1323      Assessment/Plan: s/p Procedure(s): ESOPHAGOGASTRODUODENOSCOPY (EGD)   Parastomal hernia - Essentially reduced, this is not causing a bowel obstruction at this time. Partial small bowel obstruction - resolved  There is no acute surgical need. We will follow from a distance.   ABL Anemia from Gastric ulcer/hematemesis -Currently  I do not think she is actively bleeding. Management per gastroenterology.   1.DYS 3 diet 2. IVF, pain control, antiemetics,  3. Hgb dropped a little. Suspect reequilibration. 4. Ambulate, IS, SCD's and lovenox  5. No planned surgical interventions as this is a very hostile abdomen, would need transfer to tertiary care if she wants her ostomy revised or taken down due to complexity of surgery needed and extent of adhesions  6. Pain and nausea resolving. Hopefully she can be discharged tomorrow if Hgb is stable and pain resolved to follow up as an outpatient at tertiary center  7. Consulted dietitian today to give nutrition recommendations  8.  PO protonix  9. PT/OT involved- signed off     LOS: 7 days    Va Broadwell M 11/11/2013

## 2013-11-11 NOTE — Progress Notes (Signed)
FMTS Attending Daily Note:  Regina DonJeff Regina Beitz MD  [phone] pager  Family Practice pager:  [phone] I have seen and examined this patient and have reviewed their chart. I have discussed this patient with the resident. I agree with the resident's findings, assessment and care plan.  Additionally:  - Continues to slowly improve.  Dysphagia diet challenge today.  If able to tolerate and continue on course, hopeful for DC home tomorrow.   Regina GrimJeffrey H Vern Prestia, MD 11/11/2013 2:08 PM

## 2013-11-11 NOTE — Progress Notes (Signed)
Family Medicine Teaching Service Daily Progress Note Intern Pager: [phone]  Patient name: [name] Medical record number: [id number] Date of birth: 07-May-1941 Age: 73 y.o. Gender: female  Primary Care Provider: Viann Shove, MD Consultants: General Surgery, GI Code Status: full  Pt Overview and Major Events to Date:  12/27 - admitted with partial SBO 12/28 - new fever to 101.9, Zosyn (Day 1), remain NPO, if surgery req will need transferred to tertiary center 12/29 - improved abd pain / nausea. NGT remains, continue NPO 12/30 - NG clamped, advance to small sips, o/n episode hematemesis --> NGT return to suction 12/31 - Hgb 11.6-->5.4 (pending confirmation repeat H/H), consult GI --> EGD 12/31 - transfer to SDU, ordered 2u PRBC --> Hgb 5.3-->10.8, NGT removed 1/1 - Hgb 10.3, stable w/o NGT, advanced diet to clear liquids, transfer SDU --> telemetry floor 1/2 - decreased Hgb to 9.6, advanced to full liquid diet, VSS, PT/OT - no follow-up recs 1/3 - slow decreasing Hgb trend 10.3-->9.6-->8.8, advanced to dysphagia 3 diet  Assessment and Plan: [name] is a 73 y.o. female presenting with abdominal pain, nausea, vomiting and decreased ostomy output, found to have a partial SBO. Course significant for acute blood loss anemia secondary to UGI bleed from gastric ulcer, since stabilized with resolution of bleeding and s/p 2u PRBC transfusion. PMH is significant for partial colectomy with ileostomy in 2011, CAD, HTN, and chronic kidney disease.   # Partial Small Bowel Obstruction, in setting of complex prior bowel resection with ostomy - Improved Initially presented abdominal pain, n/v, and decreased ostomy output likely due to partial SBO, demonstrated on CT Abd 12/27, received empiric coverage for potential GI source with Zosyn (after febrile to 101.9, since afebrile and DC'd Zosyn). Subsequent Abd Xray 12/31 with improved but persistent PSBO, no evidence of free  air. Significantly improved abdominal pain, minimal on exam. Continued good ostomy flatus with present small amount of brown semi-solid stool in ostomy bag, remains afebrile x 6 days. Tolerated full liquid diet well, eager to advance today. - Continue conservative management of resolving PSBO - IV zofran PRN - Pain management:     - Percocet 5/325 q 4 PRN pain - (required x 2 doses in 24 hrs)     - Morphine IV 1-2mg  q 2 hr PRN breakthrough pain - (did not require in 24 hrs)     - Colace daily - f/u blood cultures (drawn 12/28 - NGTD x 5 days), Urine culture (negative final) - c/s General Surgery, appreciate all recommendations:   - continue to follow closely, improving PSBO with functioning ostomy   - advance diet to dysphagia 3 soft diet today   - continue ambulation with PT/OT   - No current plans for surgery. Unlikely, but if required, need transfer to tertiary center d/t complexity of surgery  # Acute Blood Loss Anemia, secondary to UGI bleed from gastric ulcer - Resolved Significant episode hematemesis on 12/30, 200-300cc gross blood + clots, resumed NGT suction with improvement, and noted low systolic BPs (low 88), given 1L NS bolus with good response, transferred to SDU x 24 hrs, since returned to telemetry floor. Continued daily clinical improvement, improved Hgb s/p 2u PRBC, stable BP, no more hematemesis or overt bleeding episodes, despite gradual decline Hgb. - Continued gradual decrease in Hgb trend 11.6-->5.3--> (2u PRBC) --> 10.8-->10.3-->9.6-->8.8 - continue to HOLD Lovenox. Discontinue Plavix - Protonix PO 40mg  daily - c/s GI, greatly appreciate all assistance   - performed EGD (12/31)  showed linear lesser curvature gastric ulcer (non-bleeding), also noticed large clot in stomach, with otherwise normal mucosa   - suspect stress induced gastric ulcer, supported by (negative) H. Pylori serology   - continue Protonix   - concern for possibility of continued bleeding, IF hgb  continues to decrease or active bleeding signs, plan to repeat EGD  # CAD s/p NSTEMI in 2011 H/o cardiac cath with multiple vessel disease; Trop neg x3 and EKG w/o evidence of ischemia on admission - Cont statin, beta blocker ACE-I - Keep Plavix discontinued, do not resume on discharge as recommendation given remote h/o NSTEMI  # HTN BP has remained well-controlled, and occasionally low, excluding period of hypotension related to GI Bleed. - Continued home Enalapril 5mg  BID, and Metoprolol 25mg  BID - Held PM doses of Enalapril / Metoprolol last night given low BP, consider re-evaluation of BP med needs on discharge - Today discontinue Amlodipine 5mg  daily, decrease Metoprolol 25--> 12.5mg  BID, and hold Enalapril AM dose  # H/o Asthma - Cont home dulera, add albuterol PRN   # AKI, in setting of CKD III - Resolved Baseline Cr around 1.00, GFR <50 - improving Cr trend 1.77-->1.26-->1.00-->1.07-->0.90-->0.83-->0.93 - IVF hydration, with inc PO intake  # Hyponatremia - Resolved Na 131 on admission; likely hypovolemic due to vomiting and decreased PO intake - stable Na trend 137 - KVO NS IVF, inc PO intake with clear liquids  FEN/GI: clear liquid diet, KVO NS  Prophylaxis: SCDs, HOLD Lovenox  Disposition: remain inpatient until partial small bowel obstruction resolves, currently on telemetry floor, hospital course complicated by active UGI bleed from gastric ulcer, since resolved, improved Hgb after 2u PRBC transfusion, clinical improved, advanced diet, potential to be discharged to home tomorrow (1-2 days). - PT/OT eval with recommendations - No follow-up, equipment, or HH needed  Subjective:  Overnight no significant events. Tolerated advancement in diet to full liquids yesterday without any nausea / vomiting. Family at bedside assisted with translation. Patient reports she continues to feel better daily and is eager to go home. Endorses no abdominal pain today, admits to increased  ostomy output, tolerating ambulation and worked with PT yesterday. Able to ambulate without concerns, did well with PT/OT yesterday.  Objective: Temp:  [98.3 F (36.8 C)-99.8 F (37.7 C)] 99.8 F (37.7 C) (01/03 0524) Pulse Rate:  [73-95] 95 (01/03 1023) Resp:  [19-20] 20 (01/03 0524) BP: (103-119)/(52-75) 107/58 mmHg (01/03 1023) SpO2:  [61 %-96 %] 95 % (01/03 1023) Physical Exam: Gen - awake, pleasant, and cooperative, resting in bed, family member at bedside, NAD  Heart - RRR, persistent 2/6 systolic murmur Lungs - CTAB  Abd - mostly resolved abdominal tenderness, soft, non-distended, +active BS, L-colostomy bag with slight amount of brown semi-solid stool, pink mucosa at stoma Ext - WWP, non-tender, moves all ext, peripheral pulses intact  Neuro - awake, alert, oriented, grossly non-focal, intact muscle strength  Laboratory:  Recent Labs Lab 11/09/13 0500 11/10/13 0320 11/11/13 0606  WBC 15.7* 14.2* 12.5*  HGB 10.3* 9.6* 8.8*  HCT 29.9* 27.5* 25.4*  PLT 161 146* 148*    Recent Labs Lab 11/09/13 0500 11/10/13 0320 11/11/13 0606  NA 147 137 137  K 4.0 3.6* 3.1*  CL 118* 104 103  CO2 18* 21 24  BUN 31* 24* 11  CREATININE 0.90 0.83 0.93  CALCIUM 7.4* 7.6* 7.4*  PROT 5.0*  --   --   BILITOT 0.6  --   --   ALKPHOS 74  --   --  ALT 13  --   --   AST 17  --   --   GLUCOSE 94 112* 116*    Troponin - negative x3 PT 18.5 / INR 1.59 UA (negative for infection) Urine Culture (negative, final) Blood Culture (collected 11/05/13 @ 1205) - NGTD x 5 days  Imaging/Diagnostic Tests:  12/27 CT Abdomen/Pelvis IMPRESSION:  1. The patient has apparently undergone previous right colectomy.  There has been creation of an ileostomy on the left. The small bowel  exhibits mild distention of its lumen in its proximal 2/3 but there  is a relative transition zone to non-opacified small bowel in the  left mid active abdomen shortly proximal to the ileostomy. There is  no  evidence of an internal hernia. There is some herniation of  normal-appearing distal ileum into the ostomy site. No  intra-abdominal abscess is demonstrated. Contrast will likely be  entering the ostomy bag shortly.  2. The colon is collapsed and exhibits no acute abnormality.  3. There is no evidence of acute hepatobiliary abnormality nor acute  urinary tract abnormality.  12/28 KUB IMPRESSION:  1. Persistent partial small bowel obstruction. No free air.  12/31 STAT Xray chest / abdomen IMPRESSION:  Persistent small bowel obstruction pattern without real change FINDINGS:  A nasogastric catheter is now seen within the stomach. The cardiac  shadow is stable. The lungs are well-aerated without focal  infiltrate. No acute bony abnormality is seen. No change from the prior exam.  12/31 EGD IMPRESSION:  1) Lesser curvature ulcer - nonbleeding.  2) Large clot.   Saralyn PilarAlexander Emelly Wurtz, DO 11/11/2013, 11:18 AM PGY-1, [facility] FPTS Intern pager: [phone], text pages welcome

## 2013-11-11 NOTE — Progress Notes (Signed)
Cosign student nurse progress note.

## 2013-11-11 NOTE — Progress Notes (Signed)
Patient refused assessment. SN able to assess IV sites. Primary nurse and clinical instructor aware.

## 2013-11-12 DIAGNOSIS — J45909 Unspecified asthma, uncomplicated: Secondary | ICD-10-CM

## 2013-11-12 DIAGNOSIS — I2589 Other forms of chronic ischemic heart disease: Secondary | ICD-10-CM

## 2013-11-12 LAB — BASIC METABOLIC PANEL
BUN: 7 mg/dL (ref 6–23)
CALCIUM: 7.5 mg/dL — AB (ref 8.4–10.5)
CO2: 25 meq/L (ref 19–32)
Chloride: 102 mEq/L (ref 96–112)
Creatinine, Ser: 0.97 mg/dL (ref 0.50–1.10)
GFR calc non Af Amer: 58 mL/min — ABNORMAL LOW (ref >90)
GFR, EST AFRICAN AMERICAN: 67 mL/min — AB (ref >90)
Glucose, Bld: 106 mg/dL — ABNORMAL HIGH (ref 70–99)
Potassium: 3.7 mEq/L (ref 3.7–5.3)
SODIUM: 137 meq/L (ref 137–147)

## 2013-11-12 LAB — CBC
HCT: 24.2 % — ABNORMAL LOW (ref 36.0–46.0)
Hemoglobin: 8.3 g/dL — ABNORMAL LOW (ref 12.0–15.0)
MCH: 28.4 pg (ref 26.0–34.0)
MCHC: 34.3 g/dL (ref 30.0–36.0)
MCV: 82.9 fL (ref 78.0–100.0)
PLATELETS: 162 10*3/uL (ref 150–400)
RBC: 2.92 MIL/uL — AB (ref 3.87–5.11)
RDW: 14.6 % (ref 11.5–15.5)
WBC: 7.2 10*3/uL (ref 4.0–10.5)

## 2013-11-12 MED ORDER — PANTOPRAZOLE SODIUM 40 MG PO TBEC: 40.0000 mg | DELAYED_RELEASE_TABLET | Freq: Every day | ORAL | Status: DC

## 2013-11-12 MED ORDER — METOPROLOL TARTRATE 12.5 MG HALF TABLET: 12.5000 mg | ORAL_TABLET | Freq: Two times a day (BID) | ORAL | Status: DC

## 2013-11-12 NOTE — Progress Notes (Signed)
Unable to complete full head to toe assessment as patient did not want student nurse to assess. Student nurse observed physician when he was in the patient's room completing his assessment utilizing an interpretor.

## 2013-11-12 NOTE — Progress Notes (Signed)
FMTS Attending Daily Note:  Regina DonJeff Chelsey Redondo MD  [phone] pager  Family Practice pager:  [phone] I have seen and examined this patient and have reviewed their chart. I have discussed this patient with the resident. I agree with the resident's findings, assessment and care plan.  Additionally:  Greatly improved.  Ok for DC today   Tobey GrimJeffrey H Haward Pope, MD 11/12/2013 12:41 PM

## 2013-11-12 NOTE — Progress Notes (Signed)
    Progress Note Covering for Dr. Elnoria HowardHung   Subjective  No complaints   Objective   Vital signs in last 24 hours: Temp:  [98.7 F (37.1 C)-99.6 F (37.6 C)] 98.7 F (37.1 C) (01/04 0617) Pulse Rate:  [84-90] 90 (01/04 0617) Resp:  [18-21] 21 (01/04 0617) BP: (112-115)/(64-67) 112/65 mmHg (01/04 0617) SpO2:  [95 %-97 %] 95 % (01/04 0617) Last BM Date: 11/11/13 General:    white female in NAD Psych:  Cooperative. Normal mood and affect.  Lab Results:  Recent Labs  11/10/13 0320 11/11/13 0606 11/12/13 0609  WBC 14.2* 12.5* 7.2  HGB 9.6* 8.8* 8.3*  HCT 27.5* 25.4* 24.2*  PLT 146* 148* 162   BMET  Recent Labs  11/10/13 0320 11/11/13 0606 11/12/13 0609  NA 137 137 137  K 3.6* 3.1* 3.7  CL 104 103 102  CO2 21 24 25   GLUCOSE 112* 116* 106*  BUN 24* 11 7  CREATININE 0.83 0.93 0.97  CALCIUM 7.6* 7.4* 7.5*     Assessment / Plan:   691. 73 year old female admitted for a parastomal hernia with resultant SBO.   2. Hematemesis, resolved. EGD 11/08/13 revealed non-bleeding gastric ulcer with large clot.  Hgb drifting slightly but not actively bleeding. Stable for discharge from GI standpoint. Should continue daily PPI at home.   3. Significant GI history.  Patient had a erforation of the duodenum with an attempted sphincterotomy for CBD stones. Repair of the duodenal perforation resulted in multiple complications, namely, necrotic right colon with subsequent colostomy.     LOS: 8 days   Willette Clusteraula Guenther  11/12/2013, 11:09 AM  [facility] Attending  I have also seen and assessed the patient and agree with the above note. Iva Booparl E. Gessner, MD, [facility] [phone]) 11/12/2013 11:44 AM

## 2013-11-12 NOTE — Discharge Instructions (Signed)
You were admitted for a Small Bowel Obstruction. You were also found to have a clot in your stomach from an area of bleeding. Because of the bleeding we recommend that you STOP taking CLOPIDOGREL, because this puts you at increased risk for more bleeding in your stomach.  Your medications are being changed as follows: Home medications before being admitted to hospital: Blood pressure: STOP taking amlodipine CONTINUE taking enalapril 5mg  twice a day DECREASE metoprolol to 12.5mg  twice a day (this was previously 25mg ) STOP taking clopidogrel  New medication: Protonix 40mg  daily. This is to help decrease stomach acid and help the clot in your stomach heal. Your Primary Care Doctor will decide if this should be continued long term.  Small Bowel Obstruction A small bowel obstruction is a blockage (obstruction) of the small intestine (small bowel). The small bowel is a long, slender tube that connects the stomach to the colon. Its job is to absorb nutrients from the fluids and foods you consume into the bloodstream.  CAUSES  There are many causes of intestinal blockage. The most common ones include:  Hernias. This is a more common cause in children than adults.  Inflammatory bowel disease (enteritis and colitis).  Twisting of the bowel (volvulus).  Tumors.  Scar tissue (adhesions) from previous surgery or radiation treatment.  Recent surgery. This may cause an acute small bowel obstruction called an ileus. SYMPTOMS   Abdominal pain. This may be dull cramps or sharp pain. It may occur in one area or may be present in the entire abdomen. Pain can range from mild to severe, depending on the degree of obstruction.  Nausea and vomiting. Vomit may be greenish or yellow bile color.  Distended or swollen stomach. Abdominal bloating is a common symptom.  Constipation.  Lack of passing gas.  Frequent belching.  Diarrhea. This may occur if runny stool is able to leak around the  obstruction. DIAGNOSIS  Your caregiver can usually diagnose small bowel obstruction by taking a history, doing a physical exam, and taking X-rays. If the cause is unclear, a CT scan (computerized tomography) of your abdomen and pelvis may be needed. TREATMENT  Treatment of the blockage depends on the cause and how bad the problem is.   Sometimes, the obstruction improves with bed rest and intravenous (IV) fluids.  Resting the bowel is very important. This means following a simple diet. Sometimes, a clear liquid diet may be required for several days.  Sometimes, a small tube (nasogastric tube) is placed into the stomach to decompress the bowel. When the bowel is blocked, it usually swells up like a balloon filled with air and fluids. Decompression means that the air and fluids are removed by suction through that tube. This can help with pain, discomfort, and nausea. It can also help the obstruction resolve faster.  Surgery may be required if other treatments do not work. Bowel obstruction from a hernia may require early surgery and can be an emergency procedure. Adhesions that cause frequent or severe obstructions may also require surgery. HOME CARE INSTRUCTIONS If your bowel obstruction is only partial or incomplete, you may be allowed to go home.  Get plenty of rest.  Follow your diet as directed by your caregiver.  Only consume clear liquids until your condition improves.  Avoid solid foods as instructed. SEEK IMMEDIATE MEDICAL CARE IF:  You have increased pain or cramping.  You vomit blood.  You have uncontrolled vomiting or nausea.  You cannot drink fluids due to vomiting or pain.  You have blood or very dark stools  You develop confusion.  You begin feeling very dry or thirsty (dehydrated).  You have severe bloating.  You have chills.  You have a fever.  You feel extremely weak or you faint. MAKE SURE YOU:  Understand these instructions.  Will watch your  condition.  Will get help right away if you are not doing well or get worse. Document Released: 01/12/2006 Document Revised: 01/18/2012 Document Reviewed: 01/09/2011 [facility] Patient Information 2014 [place], [place].  Diet for Gastroesophageal Reflux Disease, Adult Reflux (acid reflux) is when acid from your stomach flows up into the esophagus. When acid comes in contact with the esophagus, the acid causes irritation and soreness (inflammation) in the esophagus. When reflux happens often or so severely that it causes damage to the esophagus, it is called gastroesophageal reflux disease (GERD). Nutrition therapy can help ease the discomfort of GERD. FOODS OR DRINKS TO AVOID OR LIMIT  Smoking or chewing tobacco. Nicotine is one of the most potent stimulants to acid production in the gastrointestinal tract.  Caffeinated and decaffeinated coffee and black tea.  Regular or low-calorie carbonated beverages or energy drinks (caffeine-free carbonated beverages are allowed).   Strong spices, such as black pepper, white pepper, red pepper, cayenne, curry powder, and chili powder.  Peppermint or spearmint.  Chocolate.  High-fat foods, including meats and fried foods. Extra added fats including oils, butter, salad dressings, and nuts. Limit these to less than 8 tsp per day.  Fruits and vegetables if they are not tolerated, such as citrus fruits or tomatoes.  Alcohol.  Any food that seems to aggravate your condition. If you have questions regarding your diet, call your caregiver or a registered dietitian. OTHER THINGS THAT MAY HELP GERD INCLUDE:   Eating your meals slowly, in a relaxed setting.  Eating 5 to 6 small meals per day instead of 3 large meals.  Eliminating food for a period of time if it causes distress.  Not lying down until 3 hours after eating a meal.  Keeping the head of your bed raised 6 to 9 inches (15 to 23 cm) by using a foam wedge or blocks under the legs of the bed.  Lying flat may make symptoms worse.  Being physically active. Weight loss may be helpful in reducing reflux in overweight or obese adults.  Wear loose fitting clothing EXAMPLE MEAL PLAN This meal plan is approximately 2,000 calories based on https://www.[facility].org/ meal planning guidelines. Breakfast   cup cooked oatmeal.  1 cup strawberries.  1 cup low-fat milk.  1 oz almonds. Snack  1 cup cucumber slices.  6 oz yogurt (made from low-fat or fat-free milk). Lunch  2 slice whole-wheat bread.  2 oz sliced [place].  2 tsp mayonnaise.  1 cup blueberries.  1 cup snap peas. Snack  6 whole-wheat crackers.  1 oz string cheese. Dinner   cup brown rice.  1 cup mixed veggies.  1 tsp olive oil.  3 oz grilled fish. Document Released: 10/26/2005 Document Revised: 01/18/2012 Document Reviewed: 09/11/2011 [facility] Patient Information 2014 [place], [place].

## 2013-11-12 NOTE — Progress Notes (Signed)
4 Days Post-Op  Subjective: Alert and comfortable and pleasant. No complaints of pain or nausea. Tolerating diet. Good ileostomy output.  Family is present today for translation.  Hemoglobin 8.3,   8.8 yesterday,   9.6 January 2.  Objective: Vital signs in last 24 hours: Temp:  [98.7 F (37.1 C)-99.6 F (37.6 C)] 98.7 F (37.1 C) (01/04 0617) Pulse Rate:  [84-90] 90 (01/04 0617) Resp:  [18-21] 21 (01/04 0617) BP: (112-115)/(64-67) 112/65 mmHg (01/04 0617) SpO2:  [95 %-97 %] 95 % (01/04 0617) Last BM Date: 11/11/13  Intake/Output from previous day: 01/03 0701 - 01/04 0700 In: 480 [P.O.:480] Out: 1 [Stool:1] Intake/Output this shift:    General appearance: alert. Pleasant. No distress. Mental status normal. GI: abdomen soft and nondistended. Ileostomy pink and healthy. Ileostomy output breakdown. Small, soft parastomal hernia easily reducible.  Lab Results:   Recent Labs  11/11/13 0606 11/12/13 0609  WBC 12.5* 7.2  HGB 8.8* 8.3*  HCT 25.4* 24.2*  PLT 148* 162   BMET  Recent Labs  11/11/13 0606 11/12/13 0609  NA 137 137  K 3.1* 3.7  CL 103 102  CO2 24 25  GLUCOSE 116* 106*  BUN 11 7  CREATININE 0.93 0.97  CALCIUM 7.4* 7.5*   PT/INR No results found for this basename: LABPROT, INR,  in the last 72 hours ABG No results found for this basename: PHART, PCO2, PO2, HCO3,  in the last 72 hours  Studies/Results: No results found.  Anti-infectives: Anti-infectives   Start     Dose/Rate Route Frequency Ordered Stop   11/05/13 1030  piperacillin-tazobactam (ZOSYN) IVPB 3.375 g  Status:  Discontinued     3.375 g 12.5 mL/hr over 240 Minutes Intravenous 3 times per day 11/05/13 [id number] 11/07/13 1323      Assessment/Plan: s/p Procedure(s): ESOPHAGOGASTRODUODENOSCOPY (EGD)  Parastomal hernia - Essentially reduced, this is not causing a bowel obstruction at this time.   Partial small bowel obstruction - resolved  There is no acute surgical need. We will follow  from a distance.   UGIB - ABL Anemia from Gastric ulcer/hematemesis -Currently I do not think she is actively bleeding. Management per gastroenterology.   Plan: 1.DYS 3 diet  2. IVF, pain control, antiemetics,  3. Hgb dropped a little. Suspect reequilibration.  4. Ambulate, IS, SCD's and lovenox  5. No planned surgical interventions as this is a very hostile abdomen, would need transfer to tertiary care if she wants her ostomy revised or taken down due to complexity of surgery needed and extent of adhesions Followed by Dr. Carolynne Edouardoth as OP.  6. Pain and nausea resolving. Hopefully she can be discharged. 7. Consulted dietitian today to give nutrition recommendations  8. PO protonix  9. PT/OT involved- signed off  Yaden Seith M. Derrell LollingIngram, M.D., [facility] General and Minimally invasive Surgery Breast and Colorectal Surgery Office:   [phone]    LOS: 8 days    Kodie Kishi M 11/12/2013

## 2013-11-12 NOTE — Progress Notes (Signed)
Family Medicine Teaching Service Daily Progress Note Intern Pager: [phone]  Patient name: [name] Amador Medical record number: [id number] Date of birth: [birth year] Age: 73 y.o. Gender: female  Primary Care Provider: Viann ShoveSiy-Hian, Bienvenido Chan, MD Consultants: General Surgery, GI Code Status: full  Pt Overview and Major Events to Date:  12/27 - admitted with partial SBO 12/28 - new fever to 101.9, Zosyn (Day 1), remain NPO, if surgery req will need transferred to tertiary center 12/29 - improved abd pain / nausea. NGT remains, continue NPO 12/30 - NG clamped, advance to small sips, o/n episode hematemesis --> NGT return to suction 12/31 - Hgb 11.6-->5.4 (pending confirmation repeat H/H), consult GI --> EGD 12/31 - transfer to SDU, ordered 2u PRBC --> Hgb 5.3-->10.8, NGT removed 1/1 - Hgb 10.3, stable w/o NGT, advanced diet to clear liquids, transfer SDU --> telemetry floor 1/2 - decreased Hgb to 9.6, advanced to full liquid diet, VSS, PT/OT - no follow-up recs 1/3 - slow decreasing Hgb trend 10.3-->9.6-->8.8, advanced to dysphagia 3 diet  Assessment and Plan: [name] Masih is a 73 y.o. female presenting with abdominal pain, nausea, vomiting and decreased ostomy output, found to have a partial SBO. Course significant for acute blood loss anemia secondary to UGI bleed from gastric ulcer, since stabilized with resolution of bleeding and s/p 2u PRBC transfusion. PMH is significant for partial colectomy with ileostomy in 2011, CAD, HTN, and chronic kidney disease.   # Partial Small Bowel Obstruction, in setting of complex prior bowel resection with ostomy - Improved Initially presented abdominal pain, n/v, and decreased ostomy output likely due to partial SBO, demonstrated on CT Abd 12/27, received empiric coverage for potential GI source with Zosyn (after febrile to 101.9, since afebrile and DC'd Zosyn). Subsequent Abd Xray 12/31 with improved but persistent PSBO, no evidence of free air. -  Tolerating diet dys 3, denies nausea and abdominal pain this AM - Continue conservative management of resolving PSBO - IV zofran PRN - Pain management:     - Percocet 5/325 q 4 PRN pain - (required x 2 doses in 24 hrs)     - Morphine IV 1-2mg  q 2 hr PRN breakthrough pain - (did not require in 24 hrs)     - Colace daily - blood cultures (drawn 12/28 - no growth final), Urine culture (negative final) - c/s General Surgery, appreciate all recommendations:   - continue to follow closely, improving PSBO with functioning ostomy   - advance diet to dysphagia 3 soft diet   - continue ambulation with PT/OT   - No current plans for surgery. Unlikely, but if required, need transfer to tertiary center d/t complexity of surgery  # Acute Blood Loss Anemia, secondary to UGI bleed from gastric ulcer - Resolved Significant episode hematemesis on 12/30, 200-300cc gross blood + clots, resumed NGT suction with improvement, and noted low systolic BPs (low 88), given 1L NS bolus with good response, transferred to SDU x 24 hrs, since returned to telemetry floor. - Continued daily clinical improvement, improved Hgb s/p 2u PRBC, stable BP, no more hematemesis or overt bleeding episodes, despite gradual decline Hgb. - Continued gradual decrease in Hgb trend 11.6-->5.3--> (2u PRBC) --> 10.8-->10.3-->9.6-->8.8-->8.3 - continue to HOLD Lovenox. Discontinue Plavix - Protonix PO 40mg  daily - c/s GI, greatly appreciate all assistance   - performed EGD (12/31) showed linear lesser curvature gastric ulcer (non-bleeding), also noticed large clot in stomach, with otherwise normal mucosa   - suspect stress induced gastric ulcer, supported  by (negative) H. Pylori serology   - continue Protonix   - concern for possibility of continued bleeding, will discuss with GI for possible need of repeat EGD  # CAD s/p NSTEMI in 2011 H/o cardiac cath with multiple vessel disease; Trop neg x3 and EKG w/o evidence of ischemia on admission -  Cont statin, beta blocker ACE-I - Keep Plavix discontinued, do not resume on discharge as recommendation given remote h/o NSTEMI  # HTN BP has remained well-controlled, and occasionally low, excluding period of hypotension related to GI Bleed. - Continued home Enalapril 5mg  BID, and Metoprolol 25mg  BID - Held PM doses of Enalapril / Metoprolol last night given low BP, consider re-evaluation of BP med needs on discharge - Today discontinue Amlodipine 5mg  daily, decrease Metoprolol 25--> 12.5mg  BID, and hold Enalapril AM dose  # H/o Asthma - Cont home dulera, add albuterol PRN   # AKI, in setting of CKD III - Resolved Baseline Cr around 1.00, GFR <50 - improving Cr trend 1.77-->1.26-->1.00-->1.07-->0.90-->0.83-->0.93 - IVF hydration, with inc PO intake  # Hyponatremia - Resolved Na 131 on admission; likely hypovolemic due to vomiting and decreased PO intake - stable Na trend 137 - KVO NS IVF, inc PO intake with clear liquids  FEN/GI: clear liquid diet, KVO NS  Prophylaxis: SCDs, HOLD Lovenox  Disposition: clinically improved but still with decreasing Hgb, likely discharge pending surgery/GI recs - PT/OT eval with recommendations - No follow-up, equipment, or HH needed  Subjective:  Interpreter phone used. No acute events overnight. She appears much better and says she feels much improved. Has no abdominal pain today. Was able to eat breakfast this morning without any problems. Denies nausea/vomiting, fevers/chills, CP or SOB.  Objective: Temp:  [98.7 F (37.1 C)-99.6 F (37.6 C)] 98.7 F (37.1 C) (01/04 0617) Pulse Rate:  [84-95] 90 (01/04 0617) Resp:  [18-21] 21 (01/04 0617) BP: (107-115)/(58-67) 112/65 mmHg (01/04 0617) SpO2:  [95 %-97 %] 95 % (01/04 0617) Physical Exam: Gen - NAD, appears in good spirits  Heart - RRR, 2/6 systolic murmur Lungs - CTAB, normal effort  Abd - soft, non-distended, +active BS, L-colostomy bag green/brown semi-solid stool, pink mucosa at  stoma Ext - no edema, non-tender, moves all ext, 2+ radial and DP pulses Neuro - alert & oriented, spontaneous movement x 4 quadrants  Laboratory:  Recent Labs Lab 11/10/13 0320 11/11/13 0606 11/12/13 0609  WBC 14.2* 12.5* 7.2  HGB 9.6* 8.8* 8.3*  HCT 27.5* 25.4* 24.2*  PLT 146* 148* 162    Recent Labs Lab 11/09/13 0500 11/10/13 0320 11/11/13 0606  NA 147 137 137  K 4.0 3.6* 3.1*  CL 118* 104 103  CO2 18* 21 24  BUN 31* 24* 11  CREATININE 0.90 0.83 0.93  CALCIUM 7.4* 7.6* 7.4*  PROT 5.0*  --   --   BILITOT 0.6  --   --   ALKPHOS 74  --   --   ALT 13  --   --   AST 17  --   --   GLUCOSE 94 112* 116*    Troponin - negative x3 PT 18.5 / INR 1.59 UA (negative for infection) Urine Culture (negative, final) Blood Culture (collected 11/05/13 @ 1205) - NGTD x 5 days  Imaging/Diagnostic Tests:  12/27 CT Abdomen/Pelvis IMPRESSION:  1. The patient has apparently undergone previous right colectomy.  There has been creation of an ileostomy on the left. The small bowel  exhibits mild distention of its lumen in its  proximal 2/3 but there  is a relative transition zone to non-opacified small bowel in the  left mid active abdomen shortly proximal to the ileostomy. There is  no evidence of an internal hernia. There is some herniation of  normal-appearing distal ileum into the ostomy site. No  intra-abdominal abscess is demonstrated. Contrast will likely be  entering the ostomy bag shortly.  2. The colon is collapsed and exhibits no acute abnormality.  3. There is no evidence of acute hepatobiliary abnormality nor acute  urinary tract abnormality.  12/28 KUB IMPRESSION:  1. Persistent partial small bowel obstruction. No free air.  12/31 STAT Xray chest / abdomen IMPRESSION:  Persistent small bowel obstruction pattern without real change FINDINGS:  A nasogastric catheter is now seen within the stomach. The cardiac  shadow is stable. The lungs are well-aerated without  focal  infiltrate. No acute bony abnormality is seen. No change from the prior exam.  12/31 EGD IMPRESSION:  1) Lesser curvature ulcer - nonbleeding.  2) Large clot.   Tawni Carnes, MD 11/12/2013, 7:17 AM PGY-1, [facility] Family Medicine FPTS Intern pager: [phone], text pages welcome

## 2013-11-13 NOTE — Discharge Summary (Signed)
Family Medicine Teaching Service  Discharge Note : Attending Jeff Walden MD Pager [phone] Inpatient Team Pager:  [phone]  I have reviewed this patient and the patient's chart and have discussed discharge planning with the resident at the time of discharge. I agree with the discharge plan as above.    

## 2013-11-13 NOTE — H&P (Signed)
I have seen and examined this patient. I have discussed with Dr Clinton SawyerWilliamson.  I agree with their findings and plans as documented in their admission note.

## 2013-12-14 ENCOUNTER — Encounter (INDEPENDENT_AMBULATORY_CARE_PROVIDER_SITE_OTHER): Payer: Medicaid Other | Admitting: General Surgery

## 2013-12-14 ENCOUNTER — Ambulatory Visit (INDEPENDENT_AMBULATORY_CARE_PROVIDER_SITE_OTHER): Payer: Medicaid Other | Admitting: General Surgery

## 2013-12-14 ENCOUNTER — Encounter (INDEPENDENT_AMBULATORY_CARE_PROVIDER_SITE_OTHER): Payer: Self-pay

## 2013-12-14 ENCOUNTER — Encounter (INDEPENDENT_AMBULATORY_CARE_PROVIDER_SITE_OTHER): Payer: Self-pay | Admitting: General Surgery

## 2013-12-14 VITALS — BP 126/76 | HR 70 | Ht <= 58 in | Wt 93.8 lb

## 2013-12-14 DIAGNOSIS — Z932 Ileostomy status: Secondary | ICD-10-CM

## 2013-12-14 NOTE — Patient Instructions (Signed)
Call [name] in am about colostomy bags

## 2013-12-14 NOTE — Progress Notes (Signed)
Subjective:     Patient ID: [name], female   DOB: May 20, 1941, 73 y.o.   MRN: [id number]  HPI The patient is a 73 year old female from a fall who has a history of duodenal perforation and subsequent right colectomy. She has an ileostomy in place. She returns today because of a poorly fitting colostomy bag. Apparently they have been shipping the wrong colostomy bag to them.  Review of Systems     Objective:   Physical Exam On exam her abdomen is soft and nontender. her ileostomy is pink and productive    Assessment:     The patient apparently has an issue with the wrong colostomy bag been sent.     Plan:     I have called the company that supplies to her colostomy bags and spoken to Miltonhristina. She is aware of the problem and is in the process of fixing it in having the proper bags shipped to the family. She is shaping the [employer] 8532 bag. The patient may return to see us on a when necessary basis

## 2014-08-23 ENCOUNTER — Inpatient Hospital Stay (HOSPITAL_COMMUNITY): Payer: Medicaid Other

## 2014-08-23 ENCOUNTER — Emergency Department (HOSPITAL_COMMUNITY): Payer: Medicaid Other

## 2014-08-23 ENCOUNTER — Encounter (HOSPITAL_COMMUNITY): Payer: Self-pay | Admitting: Emergency Medicine

## 2014-08-23 ENCOUNTER — Inpatient Hospital Stay (HOSPITAL_COMMUNITY)
Admission: EM | Admit: 2014-08-23 | Discharge: 2014-08-30 | DRG: 389 | Disposition: A | Discharge status: Home / Self Care | Payer: Medicaid Other | Attending: Internal Medicine | Admitting: Internal Medicine

## 2014-08-23 DIAGNOSIS — I252 Old myocardial infarction: Secondary | ICD-10-CM

## 2014-08-23 DIAGNOSIS — I4891 Unspecified atrial fibrillation: Secondary | ICD-10-CM | POA: Diagnosis present

## 2014-08-23 DIAGNOSIS — I2510 Atherosclerotic heart disease of native coronary artery without angina pectoris: Secondary | ICD-10-CM | POA: Diagnosis present

## 2014-08-23 DIAGNOSIS — E876 Hypokalemia: Secondary | ICD-10-CM | POA: Diagnosis present

## 2014-08-23 DIAGNOSIS — Z932 Ileostomy status: Secondary | ICD-10-CM

## 2014-08-23 DIAGNOSIS — K259 Gastric ulcer, unspecified as acute or chronic, without hemorrhage or perforation: Secondary | ICD-10-CM | POA: Diagnosis present

## 2014-08-23 DIAGNOSIS — J45909 Unspecified asthma, uncomplicated: Secondary | ICD-10-CM | POA: Diagnosis present

## 2014-08-23 DIAGNOSIS — Z4659 Encounter for fitting and adjustment of other gastrointestinal appliance and device: Secondary | ICD-10-CM

## 2014-08-23 DIAGNOSIS — D62 Acute posthemorrhagic anemia: Secondary | ICD-10-CM | POA: Diagnosis present

## 2014-08-23 DIAGNOSIS — R9431 Abnormal electrocardiogram [ECG] [EKG]: Secondary | ICD-10-CM

## 2014-08-23 DIAGNOSIS — K565 Intestinal adhesions [bands] with obstruction (postprocedural) (postinfection): Principal | ICD-10-CM | POA: Diagnosis present

## 2014-08-23 DIAGNOSIS — D638 Anemia in other chronic diseases classified elsewhere: Secondary | ICD-10-CM | POA: Diagnosis present

## 2014-08-23 DIAGNOSIS — R112 Nausea with vomiting, unspecified: Secondary | ICD-10-CM | POA: Diagnosis present

## 2014-08-23 DIAGNOSIS — I480 Paroxysmal atrial fibrillation: Secondary | ICD-10-CM | POA: Diagnosis present

## 2014-08-23 DIAGNOSIS — K435 Parastomal hernia without obstruction or  gangrene: Secondary | ICD-10-CM | POA: Diagnosis present

## 2014-08-23 DIAGNOSIS — K56609 Unspecified intestinal obstruction, unspecified as to partial versus complete obstruction: Secondary | ICD-10-CM | POA: Diagnosis present

## 2014-08-23 DIAGNOSIS — I10 Essential (primary) hypertension: Secondary | ICD-10-CM | POA: Diagnosis present

## 2014-08-23 DIAGNOSIS — K56600 Partial intestinal obstruction, unspecified as to cause: Secondary | ICD-10-CM | POA: Diagnosis present

## 2014-08-23 DIAGNOSIS — K922 Gastrointestinal hemorrhage, unspecified: Secondary | ICD-10-CM | POA: Diagnosis present

## 2014-08-23 DIAGNOSIS — D72829 Elevated white blood cell count, unspecified: Secondary | ICD-10-CM

## 2014-08-23 LAB — LIPASE, BLOOD: LIPASE: 51 U/L (ref 11–59)

## 2014-08-23 LAB — URINALYSIS, ROUTINE W REFLEX MICROSCOPIC
Bilirubin Urine: NEGATIVE
GLUCOSE, UA: NEGATIVE mg/dL
Ketones, ur: NEGATIVE mg/dL
LEUKOCYTES UA: NEGATIVE
Nitrite: NEGATIVE
Protein, ur: NEGATIVE mg/dL
SPECIFIC GRAVITY, URINE: 1.014 (ref 1.005–1.030)
UROBILINOGEN UA: 0.2 mg/dL (ref 0.0–1.0)
pH: 5 (ref 5.0–8.0)

## 2014-08-23 LAB — CBC WITH DIFFERENTIAL/PLATELET
Basophils Absolute: 0 10*3/uL (ref 0.0–0.1)
Basophils Relative: 0 % (ref 0–1)
Eosinophils Absolute: 0 10*3/uL (ref 0.0–0.7)
Eosinophils Relative: 0 % (ref 0–5)
HCT: 41.2 % (ref 36.0–46.0)
Hemoglobin: 12.9 g/dL (ref 12.0–15.0)
LYMPHS ABS: 0.9 10*3/uL (ref 0.7–4.0)
Lymphocytes Relative: 6 % — ABNORMAL LOW (ref 12–46)
MCH: 27.2 pg (ref 26.0–34.0)
MCHC: 31.3 g/dL (ref 30.0–36.0)
MCV: 86.7 fL (ref 78.0–100.0)
Monocytes Absolute: 0.2 10*3/uL (ref 0.1–1.0)
Monocytes Relative: 1 % — ABNORMAL LOW (ref 3–12)
NEUTROS ABS: 15.5 10*3/uL — AB (ref 1.7–7.7)
NEUTROS PCT: 93 % — AB (ref 43–77)
Platelets: 262 10*3/uL (ref 150–400)
RBC: 4.75 MIL/uL (ref 3.87–5.11)
RDW: 13.9 % (ref 11.5–15.5)
WBC: 16.7 10*3/uL — AB (ref 4.0–10.5)

## 2014-08-23 LAB — COMPREHENSIVE METABOLIC PANEL
ALK PHOS: 133 U/L — AB (ref 39–117)
ALT: 32 U/L (ref 0–35)
AST: 32 U/L (ref 0–37)
Albumin: 3.8 g/dL (ref 3.5–5.2)
Anion gap: 16 — ABNORMAL HIGH (ref 5–15)
BUN: 21 mg/dL (ref 6–23)
CO2: 17 mEq/L — ABNORMAL LOW (ref 19–32)
Calcium: 9.2 mg/dL (ref 8.4–10.5)
Chloride: 105 mEq/L (ref 96–112)
Creatinine, Ser: 0.96 mg/dL (ref 0.50–1.10)
GFR, EST AFRICAN AMERICAN: 67 mL/min — AB (ref >90)
GFR, EST NON AFRICAN AMERICAN: 58 mL/min — AB (ref >90)
GLUCOSE: 130 mg/dL — AB (ref 70–99)
POTASSIUM: 4.9 meq/L (ref 3.7–5.3)
SODIUM: 138 meq/L (ref 137–147)
Total Bilirubin: 0.6 mg/dL (ref 0.3–1.2)
Total Protein: 8.3 g/dL (ref 6.0–8.3)

## 2014-08-23 LAB — MAGNESIUM: Magnesium: 1.8 mg/dL (ref 1.5–2.5)

## 2014-08-23 LAB — I-STAT TROPONIN, ED: Troponin i, poc: 0 ng/mL (ref 0.00–0.08)

## 2014-08-23 LAB — MRSA PCR SCREENING: MRSA by PCR: NEGATIVE

## 2014-08-23 LAB — I-STAT CG4 LACTIC ACID, ED: Lactic Acid, Venous: 1.45 mmol/L (ref 0.5–2.2)

## 2014-08-23 LAB — TROPONIN I

## 2014-08-23 LAB — URINE MICROSCOPIC-ADD ON

## 2014-08-23 MED ORDER — MORPHINE SULFATE 4 MG/ML IJ SOLN
4.0000 mg | Freq: Once | INTRAMUSCULAR | Status: AC
  Administered 2014-08-23: 4 mg via INTRAVENOUS
  Filled 2014-08-23: qty 1

## 2014-08-23 MED ORDER — SODIUM CHLORIDE 0.9 % IV SOLN
INTRAVENOUS | Status: DC
  Administered 2014-08-23: 1000 mL via INTRAVENOUS

## 2014-08-23 MED ORDER — IOHEXOL 300 MG/ML  SOLN
25.0000 mL | INTRAMUSCULAR | Status: AC
  Administered 2014-08-23: 25 mL via ORAL

## 2014-08-23 MED ORDER — ACETAMINOPHEN 325 MG PO TABS: 650.0000 mg | ORAL_TABLET | Freq: Four times a day (QID) | ORAL | Status: DC | PRN

## 2014-08-23 MED ORDER — HEPARIN SODIUM (PORCINE) 5000 UNIT/ML IJ SOLN
5000.0000 [IU] | Freq: Three times a day (TID) | INTRAMUSCULAR | Status: DC
  Administered 2014-08-23 – 2014-08-30 (×20): 5000 [IU] via SUBCUTANEOUS
  Filled 2014-08-23 (×24): qty 1

## 2014-08-23 MED ORDER — ACETAMINOPHEN 650 MG RE SUPP: 650.0000 mg | Freq: Four times a day (QID) | RECTAL | Status: DC | PRN

## 2014-08-23 MED ORDER — LABETALOL HCL 5 MG/ML IV SOLN: 10.0000 mg | INTRAVENOUS | Status: DC | PRN

## 2014-08-23 MED ORDER — ONDANSETRON HCL 4 MG/2ML IJ SOLN
4.0000 mg | Freq: Once | INTRAMUSCULAR | Status: AC
  Administered 2014-08-23: 4 mg via INTRAVENOUS
  Filled 2014-08-23: qty 2

## 2014-08-23 MED ORDER — SODIUM CHLORIDE 0.9 % IV BOLUS (SEPSIS)
1000.0000 mL | Freq: Once | INTRAVENOUS | Status: AC
  Administered 2014-08-23: 1000 mL via INTRAVENOUS

## 2014-08-23 MED ORDER — SODIUM CHLORIDE 0.9 % IV SOLN
INTRAVENOUS | Status: DC
Start: 2014-08-23 — End: 2014-08-26
  Administered 2014-08-24: via INTRAVENOUS
  Administered 2014-08-25: 1000 mL via INTRAVENOUS

## 2014-08-23 MED ORDER — MORPHINE SULFATE 4 MG/ML IJ SOLN
4.0000 mg | INTRAMUSCULAR | Status: DC | PRN
  Administered 2014-08-23 – 2014-08-28 (×10): 4 mg via INTRAVENOUS
  Filled 2014-08-23 (×10): qty 1

## 2014-08-23 MED ORDER — IOHEXOL 300 MG/ML  SOLN
80.0000 mL | Freq: Once | INTRAMUSCULAR | Status: AC | PRN
  Administered 2014-08-23: 80 mL via INTRAVENOUS

## 2014-08-23 NOTE — H&P (Signed)
Date: 08/23/2014               Jenkins Name:  [name] MRN: [id number]  DOB: 05/21/1941 Age / Sex: 73 y.o., female   PCP: Rometta EmeryMohammad L Garba, MD         Medical Service: Internal Medicine Teaching Service         Attending Physician: Dr. Earl LagosNischal Narendra, MD    First Contact: Dr. Loma NewtonNgo Pager: [phone]  Second Contact: Dr. Andrey CampanileWilson Pager: [phone]       After Hours (After 5p/  First Contact Pager: [phone]  weekends / holidays): Second Contact Pager: [phone]   Chief Complaint: abdominal pain, nausea, vomiting  History of Present Illness:   [name] is a 73 year old female with a history of asthma (CAD with NSTEMI on prior records but Jenkins's son denies on interview) and partial colectomy with ileostomy in 2011 (ERCP with duodenal perforation and subsequent right colon necrosis), who presented to [facility] ED with diffuse abdominal pain. History was obtained from Regina son. [name] rates Regina pain at a level of 8-9/10 that started this morning. Regina son reports that [name]'s ostomy had no output yesterday and but had output today that was similar to her normal level and consistency. Jenkins has reported of nausea but no vomiting. She was able to eat yesterday and keep food down before Regina onset of her symptoms. She had chills but denies fevers. According to her son, Regina current presentation is similar to her previous episode of partial SBO last year in December. She denies dyspnea, chest pain, dysuria, hematuria, or hematochezia.  In [facility] ED, Jenkins received 4 mg of morphine and had an NG tube inserted. Jenkins's symptoms were mildly improved.   Meds: Current Facility-Administered Medications  Medication Dose Route Frequency Provider Last Rate Last Dose  . 0.9 %  sodium chloride infusion   Intravenous Continuous Kristen N Ward, DO      . acetaminophen (TYLENOL) tablet 650 mg  650 mg Oral Q6H PRN Yolanda MangesAlex M Wilson, DO       Or  . acetaminophen (TYLENOL) suppository 650 mg  650 mg Rectal  Q6H PRN Yolanda MangesAlex M Wilson, DO      . heparin injection 5,000 Units  5,000 Units Subcutaneous 3 times per day Yolanda MangesAlex M Wilson, DO      . labetalol (NORMODYNE,TRANDATE) injection 10 mg  10 mg Intravenous Q2H PRN Harold BarbanLawrence Hodge Stachnik, MD      . morphine 4 MG/ML injection 4 mg  4 mg Intravenous Q4H PRN Yolanda MangesAlex M Wilson, DO   4 mg at 08/23/14 1807    Allergies: Allergies as of 08/23/2014  . (No Known Allergies)   Past Medical History  Diagnosis Date  . Asthma   . NSTEMI (non-ST elevated myocardial infarction) 2011    medically managed  . Paroxysmal atrial fibrillation     Hx of amiodarone use  . Acute gastric ulcer with bleeding 11/09/2013   Past Surgical History  Procedure Laterality Date  . Colostomy Left 2011    Dr. Carolynne Edouardoth  . Partial colectomy  2011    necrotic bowel after lap chole  . Laparoscopic cholecystectomy  2011  . Ercp  2011  . Cardiac catheterization  2011    100% occlusion RCA, 80% stenosis circumflex, 60% stenosis LAD  . Esophagogastroduodenoscopy N/A 11/08/2013    Procedure: ESOPHAGOGASTRODUODENOSCOPY (EGD);  Surgeon: Theda BelfastPatrick D Hung, MD;  [facility];  Service: Endoscopy;  Laterality: N/A;   History reviewed. No  pertinent family history. History   Social History  . Marital Status: Married    Spouse Name: N/A    Number of Children: N/A  . Years of Education: N/A   Occupational History  . Not on file.   Social History Main Topics  . Smoking status: Never Smoker   . Smokeless tobacco: Not on file  . Alcohol Use: No  . Drug Use: No  . Sexual Activity:    Other Topics Concern  . Not on file   Social History Narrative  . No narrative on file    Review of Systems: Pertinent items are noted in HPI.  Physical Exam: Blood pressure 155/73, pulse 77, temperature 98 F (36.7 C), temperature source Oral, resp. rate 14, height 4\' 4"  (1.321 m), weight 103 lb 12.8 oz (47.083 kg), SpO2 97.00%.  General: resting in bed, in moderate distress HEENT: PERRL, EOMI, no  scleral icterus Cardiac: RRR, no rubs, murmurs or gallops Pulm: Decreased lung sounds left greater than right Abd: Ostomy in RLQ. moist, no surrounding erythema. Brown liquid output in bag. Surgical scar abdomen. Distended. Hyperactive bowel sounds. Tender to palpation lower abdomen bilaterally. Ext: warm and well perfused, no pedal edema Neuro: alert and oriented X3, cranial nerves II-XII grossly intact  Lab results: Basic Metabolic Panel:  Recent Labs  16/08/9609/15/15 1240 08/23/14 1253  NA 138  --   K 4.9  --   CL 105  --   CO2 17*  --   GLUCOSE 130*  --   BUN 21  --   CREATININE 0.96  --   CALCIUM 9.2  --   MG  --  1.8   Liver Function Tests:  Recent Labs  08/23/14 1240  AST 32  ALT 32  ALKPHOS 133*  BILITOT 0.6  PROT 8.3  ALBUMIN 3.8    Recent Labs  08/23/14 1240  LIPASE 51   No results found for this basename: AMMONIA,  in [facility] last 72 hours CBC:  Recent Labs  08/23/14 1240  WBC 16.7*  NEUTROABS 15.5*  HGB 12.9  HCT 41.2  MCV 86.7  PLT 262   Cardiac Enzymes: No results found for this basename: CKTOTAL, CKMB, CKMBINDEX, TROPONINI,  in [facility] last 72 hours BNP: No results found for this basename: PROBNP,  in [facility] last 72 hours D-Dimer: No results found for this basename: DDIMER,  in [facility] last 72 hours CBG: No results found for this basename: GLUCAP,  in [facility] last 72 hours Hemoglobin A1C: No results found for this basename: HGBA1C,  in [facility] last 72 hours Fasting Lipid Panel: No results found for this basename: CHOL, HDL, LDLCALC, TRIG, CHOLHDL, LDLDIRECT,  in [facility] last 72 hours Thyroid Function Tests: No results found for this basename: TSH, T4TOTAL, FREET4, T3FREE, THYROIDAB,  in [facility] last 72 hours Anemia Panel: No results found for this basename: VITAMINB12, FOLATE, FERRITIN, TIBC, IRON, RETICCTPCT,  in [facility] last 72 hours Coagulation: No results found for this basename: LABPROT, INR,  in [facility] last 72 hours Urine Drug Screen: Drugs of Abuse  No results  found for this basename: labopia,  cocainscrnur,  labbenz,  amphetmu,  thcu,  labbarb    Alcohol Level: No results found for this basename: ETH,  in [facility] last 72 hours Urinalysis:  Recent Labs  08/23/14 1443  COLORURINE YELLOW  LABSPEC 1.014  PHURINE 5.0  GLUCOSEU NEGATIVE  HGBUR TRACE*  BILIRUBINUR NEGATIVE  KETONESUR NEGATIVE  PROTEINUR NEGATIVE  UROBILINOGEN 0.2  NITRITE NEGATIVE  LEUKOCYTESUR NEGATIVE  Imaging results:  Dg Abd Acute W/chest  08/23/2014   CLINICAL DATA:  Abdominal pain, nausea and vomiting beginning this morning.  EXAM: ACUTE ABDOMEN SERIES (ABDOMEN 2 VIEW & CHEST 1 VIEW)  COMPARISON:  Single view of [facility] chest and two views of [facility] abdomen 11/08/2013.  FINDINGS: Single view of [facility] chest demonstrates clear lungs and normal heart size. No pneumothorax or pleural effusion.  Two views of [facility]ina abdomen show no free intraperitoneal air. There are gas-filled and dilated loops of small bowel measuring up to 3.5 cm in diameter with air-fluid levels present.  IMPRESSION: Findings compatible with small-bowel obstruction.   Electronically Signed   By: Drusilla Kanner M.D.   On: 08/23/2014 13:46    Other results: EKG: sinus rhythm, ST elevation in inferior leads, seen on serial EKGs since at least December 2014  Assessment & Plan by Problem: Principal Problem:   Partial small bowel obstruction Active Problems:   Essential hypertension, benign   Atrial fibrillation   Asthma   Ileostomy status  [name] is a 73 year old female with a history of asthma (CAD with NSTEMI on prior records but Jenkins's son denies on interview) and partial colectomy with ileostomy in 2011 (ERCP with duodenal perforation and subsequent right colon necrosis) who presented to [facility] ED with diffuse abdominal pain.   Small bowel obstruction, likely partial: Jenkins coming in with abdominal pain, nausea, vomiting, abdominal distention. Jenkins has a leukocytosis 16.7. Since history of abdominal  surgeries concerning for adhesions. Abdominal X-ray concerning for findings compatible with small bowel obstruction. Surgery consulted and feel that she appears to have bowel ischemia and parastomal hernia but will defer surgical intervention given her comorbidities and complications from her past surgeries. --Surgery has ordered a CT abdomen and pelvis --Repeat abdominal plain film in AM --NPO --NG tube placed for decompression --IV zofran PRN nausea --Morphine for pain  --continue IV fluids  --appreciate surgery's input and recs  --continue to monitor  ST elevations on EKG: Jenkins denying any chest pain, troponins negative x1. Previous EKGs from at least December 2014 have shown that this may not be new.  - Cardiology consulted  Coronary artery disease (status post and stenting in 2011): Regina son denies that Jenkins is on any medications for any heart disease and also denies that his mother has a history of heart disease. However, upon chart review, Jenkins has had cardiac catheterization with multivessel disease in 2011. A discharge summary from 11/11/2013 indicates that [name] was discharged with enalapril 5 mg twice a day, metoprolol 12.5 mg twice a day, and simvastatin 20 mg daily. There are no primary care provider notes in [facility] system in [facility] interim. --Hold possible home medications pending current n.p.o. status and further clarification of home regimen. -- With further query with family if [name] has any other medications at home. -- Obtain records from Jenkins's PCP is Dr. Mikeal Hawthorne [phone]).   HTN: Hypertensive on admission at 189/72. As stated above, Jenkins may possibly be on at least enalapril and metoprolol for blood pressure control.  --continue to monitor vitals. --start IV labetalol 10 mg PRN for systolic BP > 170.   Asthma: --continue home Advair  VTE: Heparin   Dispo: Disposition is deferred at this time, awaiting improvement of current medical problems.  Anticipated discharge in approximately 3 day(s).   [name] does have a current PCP (Rometta Emery, MD) and does not need an [facility] hospital follow-up appointment after discharge.  [name] does not  have transportation limitations that hinder transportation to clinic appointments.  Signed: Harold Barban, MD 08/23/2014, 7:57 PM

## 2014-08-23 NOTE — ED Notes (Signed)
Internal Med at bedside.

## 2014-08-23 NOTE — Consult Note (Signed)
Reason for Consult: PSBO Referring Physician: Dr. 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  She reports ostomy output yesterday and last night, but stopped this morning.  Symptoms are associated with nausea, no vomiting.  She denies melena or hematochezia.  Denies diarrhea.  Denies fever or chills.  Her appetite has been adequate and no significant weight loss.  No aggravating or alleviating factors.  No modifying factors.  Last oral intake was last night.  She reports previous symptoms about 10 months ago which resolved with non operative management.  She reports shortness of breath which she attributes to her asthma.  Her work up shows a white count of 16.7K, normal renal function, normal h&h and negative troponin.   Abdominal XR shows a small a bowel obstruction.  We have therefore been asked to evaluate.    Past Medical History  Diagnosis Date  . Asthma   . NSTEMI (non-ST elevated myocardial infarction) 2011    medically managed  . Paroxysmal atrial fibrillation     Hx of amiodarone use  . Acute gastric ulcer with bleeding 11/09/2013    Past Surgical History  Procedure Laterality Date  . Colostomy Left 2011    Dr. Marlou Starks  . Partial colectomy  2011    necrotic bowel after lap chole  . Laparoscopic cholecystectomy  2011  . Ercp  2011  . Cardiac catheterization  2011    100% occlusion RCA, 80% stenosis circumflex, 60% stenosis LAD  . Esophagogastroduodenoscopy N/A 11/08/2013    Procedure: ESOPHAGOGASTRODUODENOSCOPY (EGD);  Surgeon: Beryle Beams, MD;  Location: [facility] ENDOSCOPY;  Service: Endoscopy;  Laterality: N/A;    History reviewed. No pertinent family  history.  Social History:  reports that she has never smoked. She does not have any smokeless tobacco history on file. She reports that she does not drink alcohol or use illicit drugs.  Allergies: No Known Allergies  Medications:  Prior to Admission medications   Not on File     Results for orders placed during the hospital encounter of 08/23/14 (from the past 48 hour(s))  CBC WITH DIFFERENTIAL     Status: Abnormal   Collection Time    08/23/14 12:40 PM      Result Value Ref Range   WBC 16.7 (*) 4.0 - 10.5 K/uL   RBC 4.75  3.87 - 5.11 MIL/uL   Hemoglobin 12.9  12.0 - 15.0 g/dL   HCT 41.2  36.0 - 46.0 %   MCV 86.7  78.0 - 100.0 fL   MCH 27.2  26.0 - 34.0 pg   MCHC 31.3  30.0 - 36.0 g/dL   RDW 13.9  11.5 - 15.5 %   Platelets 262  150 - 400 K/uL   Neutrophils Relative % 93 (*) 43 - 77 %   Neutro Abs 15.5 (*) 1.7 - 7.7 K/uL   Lymphocytes Relative 6 (*) 12 - 46 %   Lymphs Abs 0.9  0.7 - 4.0 K/uL   Monocytes Relative 1 (*) 3 - 12 %   Monocytes Absolute 0.2  0.1 - 1.0 K/uL   Eosinophils Relative 0  0 - 5 %   Eosinophils Absolute 0.0  0.0 - 0.7  K/uL   Basophils Relative 0  0 - 1 %   Basophils Absolute 0.0  0.0 - 0.1 K/uL  COMPREHENSIVE METABOLIC PANEL     Status: Abnormal   Collection Time    08/23/14 12:40 PM      Result Value Ref Range   Sodium 138  137 - 147 mEq/L   Potassium 4.9  3.7 - 5.3 mEq/L   Chloride 105  96 - 112 mEq/L   CO2 17 (*) 19 - 32 mEq/L   Glucose, Bld 130 (*) 70 - 99 mg/dL   BUN 21  6 - 23 mg/dL   Creatinine, Ser 0.96  0.50 - 1.10 mg/dL   Calcium 9.2  8.4 - 10.5 mg/dL   Total Protein 8.3  6.0 - 8.3 g/dL   Albumin 3.8  3.5 - 5.2 g/dL   AST 32  0 - 37 U/L   ALT 32  0 - 35 U/L   Alkaline Phosphatase 133 (*) 39 - 117 U/L   Total Bilirubin 0.6  0.3 - 1.2 mg/dL   GFR calc non Af Amer 58 (*) >90 mL/min   GFR calc Af Amer 67 (*) >90 mL/min   Comment: (NOTE)     The eGFR has been calculated using the CKD EPI equation.     This calculation has not been  validated in all clinical situations.     eGFR's persistently <90 mL/min signify possible Chronic Kidney     Disease.   Anion gap 16 (*) 5 - 15  LIPASE, BLOOD     Status: None   Collection Time    08/23/14 12:40 PM      Result Value Ref Range   Lipase 51  11 - 59 U/L  I-STAT TROPOININ, ED     Status: None   Collection Time    08/23/14  1:01 PM      Result Value Ref Range   Troponin i, poc 0.00  0.00 - 0.08 ng/mL   Comment 3            Comment: Due to the release kinetics of cTnI,     a negative result within the first hours     of the onset of symptoms does not rule out     myocardial infarction with certainty.     If myocardial infarction is still suspected,     repeat the test at appropriate intervals.    Dg Abd Acute W/chest  08/23/2014   CLINICAL DATA:  Abdominal pain, nausea and vomiting beginning this morning.  EXAM: ACUTE ABDOMEN SERIES (ABDOMEN 2 VIEW & CHEST 1 VIEW)  COMPARISON:  Single view of the chest and two views of the abdomen 11/08/2013.  FINDINGS: Single view of the chest demonstrates clear lungs and normal heart size. No pneumothorax or pleural effusion.  Two views of the abdomen show no free intraperitoneal air. There are gas-filled and dilated loops of small bowel measuring up to 3.5 cm in diameter with air-fluid levels present.  IMPRESSION: Findings compatible with small-bowel obstruction.   Electronically Signed   By: Inge Rise M.D.   On: 08/23/2014 13:46    Review of Systems  Constitutional: Negative for fever, chills, weight loss, malaise/fatigue and diaphoresis.  Eyes: Negative for blurred vision and double vision.  Respiratory: Positive for shortness of breath. Negative for cough, hemoptysis, sputum production and wheezing.   Cardiovascular: Negative for chest pain, palpitations, orthopnea, claudication, leg swelling and PND.  Gastrointestinal: Positive for nausea. Negative for heartburn, vomiting,  diarrhea, blood in stool and melena.  Genitourinary:  Negative for dysuria, urgency, frequency, hematuria and flank pain.  Neurological: Negative.  Negative for weakness and headaches.  Psychiatric/Behavioral: Negative.    Blood pressure 159/78, temperature 97.8 F (36.6 C), temperature source Oral, resp. rate 30, SpO2 97.00%. Physical Exam  Constitutional: She is oriented to person, place, and time. She appears well-developed and well-nourished. No distress.  Cardiovascular: Normal rate, regular rhythm, normal heart sounds and intact distal pulses.  Exam reveals no gallop and no friction rub.   No murmur heard. Respiratory: Effort normal. No respiratory distress. She has wheezes. She has no rales. She exhibits no tenderness.  GI: She exhibits no mass. There is no rebound and no guarding.  Hyperactive bowel sounds, abdomen is distended, exhibits pain around the ostomy and right side of the abdomen, no peritoneal signs.  LLQ stoma is pink and viables, small amount of liquid in the ostomy bag.  Parastomal hernia  Musculoskeletal: Normal range of motion. She exhibits no edema and no tenderness.  Neurological: She is alert and oriented to person, place, and time.  Skin: Skin is warm and dry. No rash noted. She is not diaphoretic. No erythema. No pallor.  Psychiatric: She has a normal mood and affect. Her behavior is normal. Judgment and thought content normal.    Assessment/Plan: Hx cholecystectomy with duodenal repair Right colectomy with ileostomy for necrotic bowel Hx UGI bleed/gastric ulcers CAD NSTEMI 2011 Asthma Hypertension  Parastomal hernia PSBO  -I suspect her obstruction is secondary to adhesions.  At present time, she does not appear to have bowel ischemia and parastomal hernia appears stable.  Nonetheless, we will proceed with a CT of abdomen and pelvis with contrast to further evaluate. -NGT to LWIS, bowel rest, IV hydration -repeat films in AM -monitor electrolytes -hopefully she will resolve with conservative management.  We  hope to avoid surgery given her complicated surgical surgery.  Will follow daily. -thank you for the consult.   Shakeel Disney ANP-BC 08/23/2014, 2:19 PM

## 2014-08-23 NOTE — ED Notes (Signed)
Dr. Ward at bedside.

## 2014-08-23 NOTE — ED Notes (Signed)
EMS states the patient starting complaining of abd pain 10/10 this morning. Patient only speaks Napolely so interpreter was used. Patient states, " I have had this before" I haven't had stool in my bag in a few days.  Patient has a colostomy bag.  IV placed by EMS and EMS states 4mg  of Zofran was given for nausea.

## 2014-08-23 NOTE — Progress Notes (Signed)
Utilization review completed. Tanzie Rothschild, RN, BSN. 

## 2014-08-23 NOTE — Progress Notes (Signed)
MD returned my call and stated to keep an eye on pt's BP.  No new order at this time.

## 2014-08-23 NOTE — Progress Notes (Signed)
MD on call was paged to made him aware of patient's BP.  Awaiting for him to call me back.

## 2014-08-23 NOTE — Consult Note (Addendum)
Patient interviewed (family interpreter) and examined. Agree with above.  She probably has partial SBO from adhesions or parastomal hernia. Doubt compromised bowel. Very high risk for laparotomy due to past surgical events. Also has asthma, atrial fib., h/o NSTEMI.  NG/NPO CT scan admit medicine. Will see daily.     Ernestene MentionINGRAM,Kimberle Stanfill M

## 2014-08-23 NOTE — Consult Note (Addendum)
Reason for Consult: Abnormal EKG Referring Physician: Teaching Service   HPI: The patient is a 73 y/o female originally from [place] admitted to [facility] for SBO. An EKG was obtained during w/u and slight inferior ST elevations were noted, for which we have been consulted. She denies chest pain and troponin at 13:00 was negative. She does have a history of CAD, s/p NSTEMI in August 2011. [facility] revealed a totally occluded RCA and moderate disease in the LAD and left circumflex. Medical therapy was elected. EF at that time was 45-50%. She also has a h/o PAF.  As mentioned above, she denies chest pain. She does note felling slightly SOB. No other symptoms at this time.   Review of prior EKGs reveal that she has had mild inferolateral ST elevations chronically for the last year.   Past Medical History  Diagnosis Date  . Asthma   . NSTEMI (non-ST elevated myocardial infarction) 2011    medically managed  . Paroxysmal atrial fibrillation     Hx of amiodarone use  . Acute gastric ulcer with bleeding 11/09/2013    Past Surgical History  Procedure Laterality Date  . Colostomy Left 2011    Dr. Marlou Starks  . Partial colectomy  2011    necrotic bowel after lap chole  . Laparoscopic cholecystectomy  2011  . Ercp  2011  . Cardiac catheterization  2011    100% occlusion RCA, 80% stenosis circumflex, 60% stenosis LAD  . Esophagogastroduodenoscopy N/A 11/08/2013    Procedure: ESOPHAGOGASTRODUODENOSCOPY (EGD);  Surgeon: Beryle Beams, MD;  Location: [facility] ENDOSCOPY;  Service: Endoscopy;  Laterality: N/A;    History reviewed. No pertinent family history.  Social History:  reports that she has never smoked. She does not have any smokeless tobacco history on file. She reports that she does not drink alcohol or use illicit drugs.  Allergies: No Known Allergies  Medications:  Prior to Admission medications   Medication Sig Start Date End Date Taking? Authorizing Provider  albuterol (PROVENTIL HFA;VENTOLIN HFA)  108 (90 BASE) MCG/ACT inhaler Inhale 1 puff into the lungs every 6 (six) hours as needed for wheezing or shortness of breath.   Yes Historical Provider, MD  Fluticasone-Salmeterol (ADVAIR) 250-50 MCG/DOSE AEPB Inhale 1 puff into the lungs 2 (two) times daily.   Yes Historical Provider, MD     Results for orders placed during the hospital encounter of 08/23/14 (from the past 48 hour(s))  CBC WITH DIFFERENTIAL     Status: Abnormal   Collection Time    08/23/14 12:40 PM      Result Value Ref Range   WBC 16.7 (*) 4.0 - 10.5 K/uL   RBC 4.75  3.87 - 5.11 MIL/uL   Hemoglobin 12.9  12.0 - 15.0 g/dL   HCT 41.2  36.0 - 46.0 %   MCV 86.7  78.0 - 100.0 fL   MCH 27.2  26.0 - 34.0 pg   MCHC 31.3  30.0 - 36.0 g/dL   RDW 13.9  11.5 - 15.5 %   Platelets 262  150 - 400 K/uL   Neutrophils Relative % 93 (*) 43 - 77 %   Neutro Abs 15.5 (*) 1.7 - 7.7 K/uL   Lymphocytes Relative 6 (*) 12 - 46 %   Lymphs Abs 0.9  0.7 - 4.0 K/uL   Monocytes Relative 1 (*) 3 - 12 %   Monocytes Absolute 0.2  0.1 - 1.0 K/uL   Eosinophils Relative 0  0 - 5 %   Eosinophils Absolute  0.0  0.0 - 0.7 K/uL   Basophils Relative 0  0 - 1 %   Basophils Absolute 0.0  0.0 - 0.1 K/uL  COMPREHENSIVE METABOLIC PANEL     Status: Abnormal   Collection Time    08/23/14 12:40 PM      Result Value Ref Range   Sodium 138  137 - 147 mEq/L   Potassium 4.9  3.7 - 5.3 mEq/L   Chloride 105  96 - 112 mEq/L   CO2 17 (*) 19 - 32 mEq/L   Glucose, Bld 130 (*) 70 - 99 mg/dL   BUN 21  6 - 23 mg/dL   Creatinine, Ser 0.96  0.50 - 1.10 mg/dL   Calcium 9.2  8.4 - 10.5 mg/dL   Total Protein 8.3  6.0 - 8.3 g/dL   Albumin 3.8  3.5 - 5.2 g/dL   AST 32  0 - 37 U/L   ALT 32  0 - 35 U/L   Alkaline Phosphatase 133 (*) 39 - 117 U/L   Total Bilirubin 0.6  0.3 - 1.2 mg/dL   GFR calc non Af Amer 58 (*) >90 mL/min   GFR calc Af Amer 67 (*) >90 mL/min   Comment: (NOTE)     The eGFR has been calculated using the CKD EPI equation.     This calculation has not  been validated in all clinical situations.     eGFR's persistently <90 mL/min signify possible Chronic Kidney     Disease.   Anion gap 16 (*) 5 - 15  LIPASE, BLOOD     Status: None   Collection Time    08/23/14 12:40 PM      Result Value Ref Range   Lipase 51  11 - 59 U/L  MAGNESIUM     Status: None   Collection Time    08/23/14 12:53 PM      Result Value Ref Range   Magnesium 1.8  1.5 - 2.5 mg/dL  I-STAT TROPOININ, ED     Status: None   Collection Time    08/23/14  1:01 PM      Result Value Ref Range   Troponin i, poc 0.00  0.00 - 0.08 ng/mL   Comment 3            Comment: Due to the release kinetics of cTnI,     a negative result within the first hours     of the onset of symptoms does not rule out     myocardial infarction with certainty.     If myocardial infarction is still suspected,     repeat the test at appropriate intervals.  URINALYSIS, ROUTINE W REFLEX MICROSCOPIC     Status: Abnormal   Collection Time    08/23/14  2:43 PM      Result Value Ref Range   Color, Urine YELLOW  YELLOW   APPearance CLEAR  CLEAR   Specific Gravity, Urine 1.014  1.005 - 1.030   pH 5.0  5.0 - 8.0   Glucose, UA NEGATIVE  NEGATIVE mg/dL   Hgb urine dipstick TRACE (*) NEGATIVE   Bilirubin Urine NEGATIVE  NEGATIVE   Ketones, ur NEGATIVE  NEGATIVE mg/dL   Protein, ur NEGATIVE  NEGATIVE mg/dL   Urobilinogen, UA 0.2  0.0 - 1.0 mg/dL   Nitrite NEGATIVE  NEGATIVE   Leukocytes, UA NEGATIVE  NEGATIVE  URINE MICROSCOPIC-ADD ON     Status: None   Collection Time    08/23/14  2:43  PM      Result Value Ref Range   RBC / HPF 0-2  <3 RBC/hpf  I-STAT CG4 LACTIC ACID, ED     Status: None   Collection Time    08/23/14  2:47 PM      Result Value Ref Range   Lactic Acid, Venous 1.45  0.5 - 2.2 mmol/L  MRSA PCR SCREENING     Status: None   Collection Time    08/23/14  3:54 PM      Result Value Ref Range   MRSA by PCR NEGATIVE  NEGATIVE   Comment:            The GeneXpert MRSA Assay (FDA      approved for NASAL specimens     only), is one component of a     comprehensive MRSA colonization     surveillance program. It is not     intended to diagnose MRSA     infection nor to guide or     monitor treatment for     MRSA infections.    Dg Abd Acute W/chest  08/23/2014   CLINICAL DATA:  Abdominal pain, nausea and vomiting beginning this morning.  EXAM: ACUTE ABDOMEN SERIES (ABDOMEN 2 VIEW & CHEST 1 VIEW)  COMPARISON:  Single view of the chest and two views of the abdomen 11/08/2013.  FINDINGS: Single view of the chest demonstrates clear lungs and normal heart size. No pneumothorax or pleural effusion.  Two views of the abdomen show no free intraperitoneal air. There are gas-filled and dilated loops of small bowel measuring up to 3.5 cm in diameter with air-fluid levels present.  IMPRESSION: Findings compatible with small-bowel obstruction.   Electronically Signed   By: Inge Rise M.D.   On: 08/23/2014 13:46    Review of Systems  Respiratory: Positive for shortness of breath and wheezing.   Cardiovascular: Negative for chest pain.  Neurological: Negative for loss of consciousness.  All other systems reviewed and are negative.  Blood pressure 155/73, pulse 77, temperature 98 F (36.7 C), temperature source Oral, resp. rate 14, height 4' 4"  (1.321 m), weight 103 lb 12.8 oz (47.083 kg), SpO2 97.00%. Physical Exam  Constitutional: She is oriented to person, place, and time. She appears well-developed and well-nourished. No distress.  Neck: No JVD present. Carotid bruit is not present.  Cardiovascular: Normal rate, regular rhythm, normal heart sounds and intact distal pulses.  Exam reveals no gallop and no friction rub.   No murmur heard. Pulses:      Radial pulses are 2+ on the right side, and 2+ on the left side.       Dorsalis pedis pulses are 2+ on the right side, and 2+ on the left side.  Respiratory: No respiratory distress. She has wheezes (mild expiratory ). She has no  rales.  Musculoskeletal: She exhibits no edema.  Neurological: She is alert and oriented to person, place, and time.  Skin: Skin is warm and dry. She is not diaphoretic.  Psychiatric: She has a normal mood and affect. Her behavior is normal.    Assessment/Plan: Principal Problem:   Partial small bowel obstruction Active Problems:   Essential hypertension, benign   Atrial fibrillation   Asthma   Ileostomy status  1. Abnormal EKG: review of prior EKGs reveal that ST elevations have been present for the last year. She denies chest pain but has mild dyspnea. Physical exam is notable for mild wheezing. VSS. Initial troponin at 13:00 was negative.  Will repeat a troponin now. Will follow along. MD to follow with further recommendations.   Lyda Jester 08/23/2014, 7:57 PM    The patient was seen, examined and discussed with Lyda Jester, PA-C and I agree with the above.   73 year old female from [place] with known CAD admitted with small bowel obstruction with rising leukocytosis. She was noted to have ST elevation in the inferior leads. When compared to the prior ECGs from 2014 ST elevations in the inferior leads were present. They are now slightly more pronounced in the lateral leads. The patient denies any chest pain and has some SOB that seems to be related to wheezing that she is being treated for. Troponin is negative x 3. Her blood pressure is uncontrolled and we would recommend to add metoprolol 25 mg po BID.   Dorothy Spark 08/23/2014

## 2014-08-23 NOTE — ED Provider Notes (Signed)
TIME SEEN: 12:40 PM  CHIEF COMPLAINT: Abdominal pain, vomiting  HPI: Patient is a 73 year old female with history of asthma, atrial fibrillation, NSTEMI medically managed who presents to the emergency department with complaints of diffuse abdominal pain, distention, nausea and vomiting. She reports pain started this morning at 7 AM. She has had subjective fevers and chills. Patient has a colostomy. This was placed in 2011 after partial colectomy secondary to necrotic bowel after a laparoscopic cholecystectomy. She states she's had similar symptoms before in December 2014 when she had a partial small bowel obstruction. She denies any sick contacts or recent travel. She has not had any ostomy output today. She's having crampy pain without radiation. No aggravating or relieving factors. No chest pain or shortness of breath.  PCP is Dr. Mikeal HawthorneGarba  ROS: See HPI Constitutional: no fever  Eyes: no drainage  ENT: no runny nose   Cardiovascular:  no chest pain  Resp: no SOB  GI:  vomiting GU: no dysuria Integumentary: no rash  Allergy: no hives  Musculoskeletal: no leg swelling  Neurological: no slurred speech ROS otherwise negative  PAST MEDICAL HISTORY/PAST SURGICAL HISTORY:  Past Medical History  Diagnosis Date  . Asthma   . NSTEMI (non-ST elevated myocardial infarction) 2011    medically managed  . Paroxysmal atrial fibrillation     Hx of amiodarone use  . Acute gastric ulcer with bleeding 11/09/2013    MEDICATIONS:  Prior to Admission medications   Not on File    ALLERGIES:  No Known Allergies  SOCIAL HISTORY:  History  Substance Use Topics  . Smoking status: Never Smoker   . Smokeless tobacco: Not on file  . Alcohol Use: No    FAMILY HISTORY: No family history on file.  EXAM: BP 189/72  Temp(Src) 97.8 F (36.6 C) (Oral)  Resp 19  SpO2 96% CONSTITUTIONAL: Alert and oriented and responds appropriately to questions. Well-appearing; well-nourished HEAD:  Normocephalic EYES: Conjunctivae clear, PERRL ENT: normal nose; no rhinorrhea; moist mucous membranes; pharynx without lesions noted NECK: Supple, no meningismus, no LAD  CARD: RRR; S1 and S2 appreciated; no murmurs, no clicks, no rubs, no gallops RESP: Normal chest excursion without splinting or tachypnea; breath sounds clear and equal bilaterally; no wheezes, no rhonchi, no rales, no hypoxia or respiratory distress ABD/GI: Normal bowel sounds; non-distended; soft, diffusely tender to palpation with voluntary guarding, no rebound, patient has an ostomy in the left lower quadrant with no output; ostomy site is patent and well-appearing BACK:  The back appears normal and is non-tender to palpation, there is no CVA tenderness EXT: Normal ROM in all joints; non-tender to palpation; no edema; normal capillary refill; no cyanosis    SKIN: Normal color for age and race; warm NEURO: Moves all extremities equally PSYCH: The patient's mood and manner are appropriate. Grooming and personal hygiene are appropriate.  MEDICAL DECISION MAKING: Patient here with likely small bowel obstruction. Will obtain labs, urine and acute abdominal series. We'll give pain and nausea medicine and IV fluids. We'll keep her n.p.o. at this time. If x-ray is nondiagnostic, patient may need a CT of her abdomen and pelvis. Patient did have some ST elevation seen on the cardiac monitor. I obtained an EKG which shows some ST elevation in the inferior and lateral leads but does appear similar to her prior EKG. She is adamant that she does not have any chest pain, chest discomfort or shortness of breath currently. Her abdominal pain is reproducible with palpation. I do not think  that she is having an acute MI but will obtain a troponin given her cardiac history.  ED PROGRESS: Patient has a leukocytosis with left shift. LFTs and lipase are within normal limits. Troponin negative. Acute abdominal series shows a small bowel obstruction.  We'll place nasogastric tube. We'll discuss with medicine for admission.  She has had a periosteal hernia. I do not appreciate a hernia at this time. Given this finding, will consult surgery but suspect patient can be admitted to medicine.   2:13 PM  Spoke with IM teaching service for admission to step down. Surgery is at bedside evaluating the patient. Patient and family updated with plan. Patient is comfortable, sleeping with no further vomiting.     EKG Interpretation  Date/Time:  Thursday August 23 2014 12:56:15 EDT Ventricular Rate:  68 PR Interval:  148 QRS Duration: 80 QT Interval:  433 QTC Calculation: 460 R Axis:   67 Text Interpretation:  Sinus rhythm St elevation in inferior leads No significant change since last tracing Confirmed by WARD,  DO, KRISTEN [phone]([id number]) on 08/23/2014 1:25:56 PM        Layla MawKristen N Ward, DO 08/23/14 1413

## 2014-08-24 ENCOUNTER — Inpatient Hospital Stay (HOSPITAL_COMMUNITY): Payer: Medicaid Other

## 2014-08-24 DIAGNOSIS — I480 Paroxysmal atrial fibrillation: Secondary | ICD-10-CM

## 2014-08-24 LAB — CBC
HCT: 41.8 % (ref 36.0–46.0)
Hemoglobin: 13.2 g/dL (ref 12.0–15.0)
MCH: 27.5 pg (ref 26.0–34.0)
MCHC: 31.6 g/dL (ref 30.0–36.0)
MCV: 87.1 fL (ref 78.0–100.0)
Platelets: 270 10*3/uL (ref 150–400)
RBC: 4.8 MIL/uL (ref 3.87–5.11)
RDW: 14 % (ref 11.5–15.5)
WBC: 21.1 10*3/uL — AB (ref 4.0–10.5)

## 2014-08-24 LAB — COMPREHENSIVE METABOLIC PANEL
ALT: 100 U/L — AB (ref 0–35)
AST: 117 U/L — ABNORMAL HIGH (ref 0–37)
Albumin: 2.9 g/dL — ABNORMAL LOW (ref 3.5–5.2)
Alkaline Phosphatase: 150 U/L — ABNORMAL HIGH (ref 39–117)
Anion gap: 15 (ref 5–15)
BUN: 26 mg/dL — ABNORMAL HIGH (ref 6–23)
CO2: 18 mEq/L — ABNORMAL LOW (ref 19–32)
Calcium: 8.3 mg/dL — ABNORMAL LOW (ref 8.4–10.5)
Chloride: 102 mEq/L (ref 96–112)
Creatinine, Ser: 1.35 mg/dL — ABNORMAL HIGH (ref 0.50–1.10)
GFR calc Af Amer: 45 mL/min — ABNORMAL LOW (ref >90)
GFR calc non Af Amer: 38 mL/min — ABNORMAL LOW (ref >90)
GLUCOSE: 107 mg/dL — AB (ref 70–99)
Potassium: 4.9 mEq/L (ref 3.7–5.3)
SODIUM: 135 meq/L — AB (ref 137–147)
Total Bilirubin: 1.7 mg/dL — ABNORMAL HIGH (ref 0.3–1.2)
Total Protein: 6.6 g/dL (ref 6.0–8.3)

## 2014-08-24 LAB — URINE MICROSCOPIC-ADD ON

## 2014-08-24 LAB — URINALYSIS, ROUTINE W REFLEX MICROSCOPIC
Glucose, UA: NEGATIVE mg/dL
HGB URINE DIPSTICK: NEGATIVE
Ketones, ur: 15 mg/dL — AB
NITRITE: NEGATIVE
PROTEIN: NEGATIVE mg/dL
Specific Gravity, Urine: 1.04 — ABNORMAL HIGH (ref 1.005–1.030)
UROBILINOGEN UA: 0.2 mg/dL (ref 0.0–1.0)
pH: 5 (ref 5.0–8.0)

## 2014-08-24 LAB — TROPONIN I: Troponin I: <0.3 ng/mL (ref <0.30)

## 2014-08-24 MED ORDER — INFLUENZA VAC SPLIT QUAD 0.5 ML IM SUSY
0.5000 mL | PREFILLED_SYRINGE | INTRAMUSCULAR | Status: AC
  Administered 2014-08-26: 0.5 mL via INTRAMUSCULAR
  Filled 2014-08-24: qty 0.5

## 2014-08-24 MED ORDER — ALBUTEROL SULFATE (2.5 MG/3ML) 0.083% IN NEBU
2.5000 mg | INHALATION_SOLUTION | RESPIRATORY_TRACT | Status: DC | PRN
  Administered 2014-08-24: 2.5 mg via RESPIRATORY_TRACT
  Filled 2014-08-24: qty 3

## 2014-08-24 MED ORDER — PNEUMOCOCCAL VAC POLYVALENT 25 MCG/0.5ML IJ INJ
0.5000 mL | INJECTION | INTRAMUSCULAR | Status: AC
  Administered 2014-08-26: 0.5 mL via INTRAMUSCULAR
  Filled 2014-08-24: qty 0.5

## 2014-08-24 NOTE — Consult Note (Addendum)
WOC ostomy follow up Requested by CCS team to irrigate stoma.  500cc warm water instilled, difficult to maintain flow R/T blockage not letting liquid pass easily.  Pt tolerated with mod amt discomfort and cramping.  Returned 400cc green liquid and mod amt thick chunks of brown stool and numerous round hard objects which are brown beans, according to daughter at bedside.  This might be the source of the blockage.  Irrigation sleeve left in place and will return at 3:00 pm to assess for further output. Cammie Mcgeeawn Kobey Sides MSN, RN, CWOCN, [place]AP, CNS [phone]

## 2014-08-24 NOTE — Progress Notes (Signed)
Pt seen and examined with Dr. Andrey CampanileWilson. Please refer to resident note for details  In brief, pt is a 73 y/o female with PMH of asthma, multiple abdominal surgeries including a partial colectomy with ileostomy who presents with abd pain * 1 day. Pain on presentation was 9/10 with decreased ostomy output and associated nausea but no vomiting. No CP, no sob, no palpitations, no diaphoresis, no blood in ostomy. Remaining ROS negative  Exam: Gen: AAO*3, NAD Cardio: RRR, normal heart sounds Lungs: CTA b/l Abd: soft, non tender, BS +, Ostomy with minimal stool Ext: no edema   Assessment and Plan:  73 y/o female with likely partial SBO  SBO: - Surgery following - no surgical intervention at this time - Ostomy irrigation yielded hard stool and brown beans which may be responsible for the obstruction - c/w NG tube for now. abd x ray with marked improvement - WBC increasing but no signs of active infection and pt improving. Will monitor - If worsening WBC would check blood cultures, chest x ray, u/a and send cultures from ostomy  HTN: - pt was hypertensive on admission but now normotensive - Will monitor off standing meds - Per PCP note- pt was supposed to be on plavix, norvasc, statin and metoprolol - Will clarify with PCP as family denies further meds

## 2014-08-24 NOTE — Progress Notes (Signed)
Pt is leaving to go to xray now. Received order for pt to go without RN. Pt leaving with transporter. Telemetry notified.

## 2014-08-24 NOTE — Progress Notes (Signed)
2nd attempt offered bath. Pt and family refused. Tech gave Pt. Bath supplies and changed electrodes.

## 2014-08-24 NOTE — Consult Note (Signed)
WOC follow-up: Replaced irrigation sleeve with one piece flexible pouch.  Pt had another 75cc brown liquid output with mod chunks of semi formed stool in the pouch.  Daughter translates that patient's abd is not hurting as much as it was prior to the irrigation. Supplies at bedside.  WOC not available for further irrigations during the weekend. Please re-consult if further assistance is needed.  Thank-you,  Cammie Mcgeeawn Eusevio Schriver MSN, RN, CWOCN, [place], CNS [phone]

## 2014-08-24 NOTE — Progress Notes (Signed)
Subjective: [name]. History obtained with help of phone interpreter as she speaks [place]. She says the diffuse abdominal pain has improved but is still there. She had some nausea overnight but it has since resolved. She said her mouth is dry and wanted to know if she can have sponges to moisten mouth. I received a faxed copy of the patient's last PCP note from [facility] [phone]. After rounds, general surgery saw her and wound care irrigated her ostomy which returned 400cc green liquid, moderate amount of thick chunks of brown stool and numerous brown beans.   Objective: Vital signs in last 24 hours: Filed Vitals:   08/24/14 1200 08/24/14 1220 08/24/14 1300 08/24/14 1400  BP: 116/59 112/64 94/60 102/49  Pulse: 94 97 97 97  Temp:  99 F (37.2 C)    TempSrc:  Oral    Resp: 19 26 21 23   Height:      Weight:      SpO2: 94% 96% 96% 96%   Weight change:   Intake/Output Summary (Last 24 hours) at 08/24/14 1450 Last data filed at 08/24/14 1220  Gross per 24 hour  Intake    600 ml  Output   2100 ml  Net  -1500 ml   Gen: A&O x 4, no acute distress HEENT: Atraumatic, PERRL, EOMI, sclerae anicteric Heart: Regular rate and rhythm, normal S1 S2, no murmurs, rubs, or gallops Lungs: Clear to auscultation bilaterally, respirations unlabored Abd: RLQ ostomy with small amount of liquid stool, no surrounding erythema. Surgical scars. Distended but no obvious tenderness, normoactivebowel sound Ext: No edema or cyanosis  Lab Results: Basic Metabolic Panel:  Recent Labs Lab 08/23/14 1240 08/23/14 1253 08/24/14 0730  NA 138  --  135*  K 4.9  --  4.9  CL 105  --  102  CO2 17*  --  18*  GLUCOSE 130*  --  107*  BUN 21  --  26*  CREATININE 0.96  --  1.35*  CALCIUM 9.2  --  8.3*  MG  --  1.8  --    Liver Function Tests:  Recent Labs Lab 08/23/14 1240 08/24/14 0730  AST 32 117*  ALT 32 100*  ALKPHOS 133* 150*  BILITOT 0.6 1.7*  PROT 8.3 6.6  ALBUMIN 3.8 2.9*     Recent Labs Lab 08/23/14 1240  LIPASE 51   CBC:  Recent Labs Lab 08/23/14 1240 08/24/14 0134  WBC 16.7* 21.1*  NEUTROABS 15.5*  --   HGB 12.9 13.2  HCT 41.2 41.8  MCV 86.7 87.1  PLT 262 270   Cardiac Enzymes:  Recent Labs Lab 08/23/14 2132 08/24/14 0134 08/24/14 0730  TROPONINI <0.30 <0.30 <0.30  Urinalysis:  Recent Labs Lab 08/23/14 1443  COLORURINE YELLOW  LABSPEC 1.014  PHURINE 5.0  GLUCOSEU NEGATIVE  HGBUR TRACE*  BILIRUBINUR NEGATIVE  KETONESUR NEGATIVE  PROTEINUR NEGATIVE  UROBILINOGEN 0.2  NITRITE NEGATIVE  LEUKOCYTESUR NEGATIVE   Misc. Labs: none  Micro Results: Recent Results (from the past 240 hour(s))  MRSA PCR SCREENING     Status: None   Collection Time    08/23/14  3:54 PM      Result Value Ref Range Status   MRSA by PCR NEGATIVE  NEGATIVE Final   Comment:            The GeneXpert MRSA Assay (FDA     approved for NASAL specimens     only), is one component of a     comprehensive MRSA  colonization     surveillance program. It is not     intended to diagnose MRSA     infection nor to guide or     monitor treatment for     MRSA infections.   Studies/Results: Ct Abdomen Pelvis W Contrast  08/23/2014   CLINICAL DATA:  Severe diffuse abdominal pain. History of partial colectomy. Initial encounter.  EXAM: CT ABDOMEN AND PELVIS WITH CONTRAST  TECHNIQUE: Multidetector CT imaging of the abdomen and pelvis was performed using the standard protocol following bolus administration of intravenous contrast.  CONTRAST:  80mL OMNIPAQUE IOHEXOL 300 MG/ML  SOLN  COMPARISON:  11/04/2013; 05/22/2011  FINDINGS: Normal hepatic contour. There is a minimal amount of focal fatty infiltration adjacent to the fissure for ligamentum teres. Post cholecystectomy. There is unchanged mild dilatation of the common bile duct measuring 1.4 cm in maximal coronal dimension (coronal image 69, series 206), presumably the sequela of post cholecystectomy state. No  intrahepatic biliary duct dilatation. No ascites.  There is symmetric enhancement and excretion of the bilateral kidneys. No definite renal stones on this postcontrast examination. Subcentimeter hypoattenuating lesion within the superior pole the left kidney is too small to adequately characterize of favored to represent a renal cyst. No urinary obstruction or perinephric stranding. Normal appearance of the bilateral adrenal glands, pancreas and spleen. Incidental note is made of a punctate splenule.  The patient has undergone right-sided hemicolectomy with placement of an end ileostomy within the left lower abdominal quadrant. Re- demonstrated is a short segment peristomal hernia containing the adjacent distal aspect of the small bowel, now with trace amount of free fluid within the caudal aspect of the peristomal hernia (coronal image 18, series 206). The distal small bowel is dilated to the level of the ostomy with potential transition point at its very distal aspect (coronal image 13, series 206). Multiple air-fluid levels are seen within distal loops of small bowel. No pneumoperitoneum, pneumatosis or portal venous  There is a minimal amount of free fluid within the right lower abdominal quadrant (image 56, series 201). No peripherally enhancing definable/drainable fluid collections. Enteric tube tip terminates within the gastric antrum.  Scattered mixed calcified and noncalcified atherosclerotic plaque within a normal caliber abdominal aorta. The major branch vessels of the abdominal aorta appear widely patent on this non CTA examination. No retroperitoneal, mesenteric, pelvic or inguinal lymphadenopathy.  Normal appearance of the pelvic organs for age. No discrete adnexal lesions. Normal appearance of the urinary bladder. No free fluid in the pelvic cul-de-sac.  Limited visualization of the lower thorax demonstrates an unchanged approximately 4 mm nodule within the subpleural aspect of the right middle lobe  (image 2, series 205), grossly unchanged since the 05/2011 examination. No pleural effusion. No focal airspace opacities.  No acute or aggressive osseous abnormalities. Unchanged mild (under 25%) compression deformity involving the superior endplate of the L4 vertebral body. Remaining vertebral body heights are preserved.  Note is made of an approximately 3.7 x 1.1 cm hypo attenuating (approximately 4 Hounsfield unit) fluid collection within the subcutaneous tissues to the right lateral aspect of the umbilicus (image 55, series 201).  IMPRESSION: 1. Post right-sided hemicolectomy with end ileostomy within the left lower abdominal quadrant. The distal small bowel is dilated to the level of the ostomy with a potential transition point at the level of the ostomy. As a definitive etiology of this apparent transition point is not depicted, it is presumably secondary to adhesions. 2. 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 3. Trace amount of free fluid within the right lower abdomen without peripheral enhancement to suggest definable/drainable fluid collection. 4. Approximately 4 mm nodule within the subpleural aspect the right middle lobe is unchanged since the 2012 examination and thus of benign etiology.   Electronically Signed   By: Simonne ComeJohn  Watts M.D.   On: 08/23/2014 23:59   Dg Abd 2 Views  08/24/2014   CLINICAL DATA:  Abdominal pain and vomiting.  History of colostomy  EXAM: ABDOMEN - 2 VIEW  COMPARISON:  08/23/2014  FINDINGS: Stable NG tube. Dilated small bowel loops have markedly improved. There is no free intraperitoneal gas.  IMPRESSION: Improved small bowel distension. NG tube is stable in the antrum of the stomach.   Electronically Signed   By: Maryclare BeanArt  Hoss M.D.   On: 08/24/2014 08:45   Dg Abd Acute W/chest  08/23/2014   CLINICAL DATA:  Abdominal pain, nausea and vomiting beginning this morning.  EXAM: ACUTE ABDOMEN SERIES (ABDOMEN 2 VIEW & CHEST 1 VIEW)  COMPARISON:   Single view of the chest and two views of the abdomen 11/08/2013.  FINDINGS: Single view of the chest demonstrates clear lungs and normal heart size. No pneumothorax or pleural effusion.  Two views of the abdomen show no free intraperitoneal air. There are gas-filled and dilated loops of small bowel measuring up to 3.5 cm in diameter with air-fluid levels present.  IMPRESSION: Findings compatible with small-bowel obstruction.   Electronically Signed   By: Drusilla Kannerhomas  Dalessio M.D.   On: 08/23/2014 13:46   Dg Abd Portable 1v  08/23/2014   CLINICAL DATA:  Status post gastric tube placement.  EXAM: PORTABLE ABDOMEN - 1 VIEW  COMPARISON:  Contemporaneously plain film of the abdomen 08/23/2014.  FINDINGS: Interval placement of gastric tube which projects over the lower mediastinum. The tube appears to terminate in the fundus of the stomach. Side port is beyond the gastroesophageal junction. The stomach appears relatively decompressed.  Persisting dilated loops of small bowel in the lower abdomen, partially obscured by inner luminal fluid on this supine view.  No evidence of free air on this single view.  Surgical changes of prior cholecystectomy.  Unremarkable appearance of the bony elements without displaced fracture.  IMPRESSION: Interval placement of gastric tube, which terminates along the greater curvature of stomach in the fundus. The side port is beyond the GE junction.  Persisting dilated loops of small bowel, compatible with small bowel obstruction.  Surgical changes of prior cholecystectomy.  Signed,  Yvone NeuJaime S. Loreta AveWagner, DO  [facility]  [facility]   Electronically Signed   By: Gilmer MorJaime  Wagner D.O.   On: 08/23/2014 21:36   Medications: I have reviewed the patient's current medications. Scheduled Meds: . heparin  5,000 Units Subcutaneous 3 times per day   Continuous Infusions: . sodium chloride     PRN Meds:.acetaminophen, acetaminophen, albuterol, morphine  injection Assessment/Plan: Principal Problem:   Partial small bowel obstruction Active Problems:   Essential hypertension, benign   Atrial fibrillation   Asthma   Ileostomy status  Small bowel obstruction, likely partial: CT suggestive of SBO with distal small bowel dilated to the level of the ostomy but no clear transition point. Likely 2/2 adhesions from extensive abdominal surgical history. Abdominal film improved. Surgery performed digital interrogation of wound site and wants to continue medical management. Wound care irrigation yielded stool and brown beans which may have been obstructing. Patient reports symptomatic improvement from yesterday. She has been afebrile but WBC  up from 16.7 yesterday to 21.1 today. --Surgery has ordered a CT abdomen and pelvis  --appreciate surgery, wound care --NPO  --cont NG tube for decompression  --IV zofran PRN nausea  --Morphine for pain  --continue IV fluids  --continue to monitor   ST elevations on EKG: Patient denying any chest pain, troponins negative x1. Previous EKGs from at least December 2014 have shown that this may not be new.  - Cardiology consulted and not concerned as this is her baseline  Coronary artery disease (status post and stenting in 2011): The son denies that patient is on any medications for any heart disease and also denies that his mother has a history of heart disease. However, upon chart review, patient has had cardiac catheterization with multivessel disease in 2011. A discharge summary from 11/11/2013 indicates that the patient was discharged with enalapril 5 mg twice a day, metoprolol 12.5 mg twice a day, and simvastatin 20 mg daily. Faxed PCP note from 9/25 states patient is prescribed amlodipine 5 mg po daily, clopidogrel 75 mg po daily, enalapril 5 mg po BID, nitrostat 0.4 mg sl q6hprn, metoprolol 12.5 po BID, and simvastatin 20 mg po daily.  --Hold possible home medications pending current n.p.o. status  -- With  further query with family if the patient has any other medications at home.   HTN: Hypertensive on admission at 189/72 but has since been low at 90s-110s/40s-70s. Home BP regimen as documented above -hold BP meds for now as she is low and npo --continue to monitor vitals.  --start IV labetalol 10 mg PRN for systolic BP > 170.   Asthma:  --continue home Advair   VTE: Heparin    Dispo: Disposition is deferred at this time, awaiting improvement of current medical problems.  Anticipated discharge in approximately 2 day(s).   The patient does have a current PCP (Rometta Emery, MD) and does need an [facility] hospital follow-up appointment after discharge.  The patient does not know have transportation limitations that hinder transportation to clinic appointments.  .Services Needed at time of discharge: Y = Yes, Blank = No PT:   OT:   RN:   Equipment:   Other:     LOS: 1 day   [name], MD 08/24/2014, 2:50 PM

## 2014-08-24 NOTE — Progress Notes (Deleted)
Patient Name: [name] M Ritson Date of Encounter: 08/24/2014  Principal Problem:   Partial small bowel obstruction Active Problems:   Essential hypertension, benign   Atrial fibrillation   Asthma   Ileostomy status    Patient Profile: 73 yo [place] female w/ hx NSTEMI 2011 (RCA 100%, med rx, EF 50%), PAF, GIB, asthma, was admitted 10/15 w/ SBO, cards following for abnl ECG  SUBJECTIVE: Pt denies chest pain, only has abd pain.  OBJECTIVE Filed Vitals:   08/24/14 0403 08/24/14 0458 08/24/14 0500 08/24/14 0745  BP: 141/90 141/90 112/75 112/64  Pulse: 101 114 111 106  Temp: 97.9 F (36.6 C)   99.5 F (37.5 C)  TempSrc: Oral   Oral  Resp: 21 24 26 25   Height: 4\' 4"  (1.321 m)     Weight: 108 lb 11 oz (49.3 kg)     SpO2: 90% 91% 91% 91%    Intake/Output Summary (Last 24 hours) at 08/24/14 0750 Last data filed at 08/24/14 0600  Gross per 24 hour  Intake    600 ml  Output   1750 ml  Net  -1150 ml   Filed Weights   08/23/14 1548 08/24/14 0403  Weight: 103 lb 12.8 oz (47.083 kg) 108 lb 11 oz (49.3 kg)    PHYSICAL EXAM General: Well developed, well nourished, female in no acute distress. Head: Normocephalic, atraumatic.  Neck: Supple without bruits, JVD not elevated. Lungs:  Resp regular and unlabored, few rales and rhonchi, slight wheeze. Heart: RRR, S1, S2, no S3, S4, or murmur; no rub. Abdomen: Soft, tender, mod distended, BS decreased. Hernia noted, ostomy noted Extremities: No clubbing, cyanosis, edema.  Neuro: Alert and oriented X 3. Moves all extremities spontaneously. Psych: Normal affect.  LABS: CBC: Recent Labs  08/23/14 1240 08/24/14 0134  WBC 16.7* 21.1*  NEUTROABS 15.5*  --   HGB 12.9 13.2  HCT 41.2 41.8  MCV 86.7 87.1  PLT 262 270   Basic Metabolic Panel: Recent Labs  08/23/14 1240 08/23/14 1253  NA 138  --   K 4.9  --   CL 105  --   CO2 17*  --   GLUCOSE 130*  --   BUN 21  --   CREATININE 0.96  --   CALCIUM 9.2  --   MG  --   1.8   Liver Function Tests: Recent Labs  08/23/14 1240  AST 32  ALT 32  ALKPHOS 133*  BILITOT 0.6  PROT 8.3  ALBUMIN 3.8   Cardiac Enzymes: Recent Labs  08/23/14 2132 08/24/14 0134  TROPONINI <0.30 <0.30    Recent Labs  08/23/14 1301  TROPIPOC 0.00   TELE:  ST, no sig ectopy      ECG: 08/24/2014 SR, minimal inferior ST elevation, no sig change from 10/2013  Radiology/Studies: Ct Abdomen Pelvis W Contrast 08/23/2014   CLINICAL DATA:  Severe diffuse abdominal pain. History of partial colectomy. Initial encounter.  EXAM: CT ABDOMEN AND PELVIS WITH CONTRAST  TECHNIQUE: Multidetector CT imaging of the abdomen and pelvis was performed using the standard protocol following bolus administration of intravenous contrast.  CONTRAST:  80mL OMNIPAQUE IOHEXOL 300 MG/ML  SOLN  COMPARISON:  11/04/2013; 05/22/2011  FINDINGS: Normal hepatic contour. There is a minimal amount of focal fatty infiltration adjacent to the fissure for ligamentum teres. Post cholecystectomy. There is unchanged mild dilatation of the common bile duct measuring 1.4 cm in maximal coronal dimension (coronal image 69, series 206), presumably the sequela of post  cholecystectomy state. No intrahepatic biliary duct dilatation. No ascites.  There is symmetric enhancement and excretion of the bilateral kidneys. No definite renal stones on this postcontrast examination. Subcentimeter hypoattenuating lesion within the superior pole the left kidney is too small to adequately characterize of favored to represent a renal cyst. No urinary obstruction or perinephric stranding. Normal appearance of the bilateral adrenal glands, pancreas and spleen. Incidental note is made of a punctate splenule.  The patient has undergone right-sided hemicolectomy with placement of an end ileostomy within the left lower abdominal quadrant. Re- demonstrated is a short segment peristomal hernia containing the adjacent distal aspect of the small bowel, now with  trace amount of free fluid within the caudal aspect of the peristomal hernia (coronal image 18, series 206). The distal small bowel is dilated to the level of the ostomy with potential transition point at its very distal aspect (coronal image 13, series 206). Multiple air-fluid levels are seen within distal loops of small bowel. No pneumoperitoneum, pneumatosis or portal venous  There is a minimal amount of free fluid within the right lower abdominal quadrant (image 56, series 201). No peripherally enhancing definable/drainable fluid collections. Enteric tube tip terminates within the gastric antrum.  Scattered mixed calcified and noncalcified atherosclerotic plaque within a normal caliber abdominal aorta. The major branch vessels of the abdominal aorta appear widely patent on this non CTA examination. No retroperitoneal, mesenteric, pelvic or inguinal lymphadenopathy.  Normal appearance of the pelvic organs for age. No discrete adnexal lesions. Normal appearance of the urinary bladder. No free fluid in the pelvic cul-de-sac.  Limited visualization of the lower thorax demonstrates an unchanged approximately 4 mm nodule within the subpleural aspect of the right middle lobe (image 2, series 205), grossly unchanged since the 05/2011 examination. No pleural effusion. No focal airspace opacities.  No acute or aggressive osseous abnormalities. Unchanged mild (under 25%) compression deformity involving the superior endplate of the L4 vertebral body. Remaining vertebral body heights are preserved.  Note is made of an approximately 3.7 x 1.1 cm hypo attenuating (approximately 4 Hounsfield unit) fluid collection within the subcutaneous tissues to the right lateral aspect of the umbilicus (image 55, series 201).  IMPRESSION: 1. Post right-sided hemicolectomy with end ileostomy within the left lower abdominal quadrant. The distal small bowel is dilated to the level of the ostomy with a potential transition point at the level of  the ostomy. As a definitive etiology of this apparent transition point is not depicted, it is presumably secondary to adhesions. 2. 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 3. Trace amount of free fluid within the right lower abdomen without peripheral enhancement to suggest definable/drainable fluid collection. 4. Approximately 4 mm nodule within the subpleural aspect the right middle lobe is unchanged since the 2012 examination and thus of benign etiology.   Electronically Signed   By: Simonne ComeJohn  Watts M.D.   On: 08/23/2014 23:59   Dg Abd Acute W/chest 08/23/2014   CLINICAL DATA:  Abdominal pain, nausea and vomiting beginning this morning.  EXAM: ACUTE ABDOMEN SERIES (ABDOMEN 2 VIEW & CHEST 1 VIEW)  COMPARISON:  Single view of the chest and two views of the abdomen 11/08/2013.  FINDINGS: Single view of the chest demonstrates clear lungs and normal heart size. No pneumothorax or pleural effusion.  Two views of the abdomen show no free intraperitoneal air. There are gas-filled and dilated loops of small bowel measuring up to 3.5 cm in diameter with air-fluid levels present.  IMPRESSION:  Findings compatible with small-bowel obstruction.   Electronically Signed   By: Drusilla Kanner M.D.   On: 08/23/2014 13:46   Dg Abd Portable 1v 08/23/2014   CLINICAL DATA:  Status post gastric tube placement.  EXAM: PORTABLE ABDOMEN - 1 VIEW  COMPARISON:  Contemporaneously plain film of the abdomen 08/23/2014.  FINDINGS: Interval placement of gastric tube which projects over the lower mediastinum. The tube appears to terminate in the fundus of the stomach. Side port is beyond the gastroesophageal junction. The stomach appears relatively decompressed.  Persisting dilated loops of small bowel in the lower abdomen, partially obscured by inner luminal fluid on this supine view.  No evidence of free air on this single view.  Surgical changes of prior cholecystectomy.  Unremarkable appearance of  the bony elements without displaced fracture.  IMPRESSION: Interval placement of gastric tube, which terminates along the greater curvature of stomach in the fundus. The side port is beyond the GE junction.  Persisting dilated loops of small bowel, compatible with small bowel obstruction.  Surgical changes of prior cholecystectomy.  Signed,  Yvone Neu. Loreta Ave, DO  [facility]  [facility]   Electronically Signed   By: Gilmer Mor D.O.   On: 08/23/2014 21:36     Current Medications:  . heparin  5,000 Units Subcutaneous 3 times per day   . sodium chloride      ASSESSMENT AND PLAN: Principal Problem:   Partial small bowel obstruction - per IM/CCS  Active Problems:   Essential hypertension, benign - BP higher last pm, normal now but HR trending up, follow. Per IM    Atrial fibrillation - SR, ST on telemetry    Asthma - wheezing a bit this am, per IM    Ileostomy status - Per IM, CCS    Abnl ECG  - Hx abnl ECG & CAD (RCA 100%, LAD 60%, circumflex 80%, LVEF 45-50% by cath 2011), but no chest pain and ez negative for MI. BB recommended but pt is NPO, so will order IV. Pt cardiologist originally DB, CM saw in 2012, no office visits since then. KN just saw. Need to know who is MD in order to arrange OP f/u visit +/- echo.    Hx PAF - would continue BB as BP/HR will allow and keep on telemetry during acute illness.  SignedTheodore Demark , PA-C 7:50 AM 08/24/2014

## 2014-08-24 NOTE — H&P (Signed)
Pt seen and examined. I agree with documentation as outlined. Please refer to my note for further details

## 2014-08-24 NOTE — Progress Notes (Signed)
Subjective: i have seen her twice today.  The first time she had some green drainage from the NG and and some mostly clear watery drainage from the ostomy.  On return the NG is rather feculent appearing.  We were able to digitalize the ileostomy and got to at least 6 cm.  She does have some hard stool in in that would make [place] think she may have some constipation.  Objective: Vital signs in last 24 hours: Temp:  [97.8 F (36.6 C)-99.5 F (37.5 C)] 99.5 F (37.5 C) (10/16 0745) Pulse Rate:  [69-114] 106 (10/16 0745) Resp:  [14-30] 25 (10/16 0745) BP: (112-200)/(64-110) 112/64 mmHg (10/16 0745) SpO2:  [90 %-100 %] 91 % (10/16 0745) Weight:  [47.083 kg (103 lb 12.8 oz)-49.3 kg (108 lb 11 oz)] 49.3 kg (108 lb 11 oz) (10/16 0403)  300 from NG 900 from ostomy reported BP is better Creatinine is up and WBC is up, bilirubin and LFT's are also up. Film today looks better,   Intake/Output from previous day: 10/15 0701 - 10/16 0700 In: 750 [I.V.:750] Out: 1750 [Urine:550; Emesis/NG output:300; Stool:900] Intake/Output this shift: Total I/O In: -  Out: 150 [Stool:150]  General appearance: alert, cooperative, mild distress and she is uncomfortable, but no pain.  she is less distended this Am GI: soft, + parastomal hernia, I can pass my small finger up to 6 cm, with some stool in vault.  Less distension than yesterday.  she seems to have less discomfort.  the change in the NG drainage from early AM to now is new.  Lab Results:   Recent Labs  08/23/14 1240 08/24/14 0134  WBC 16.7* 21.1*  HGB 12.9 13.2  HCT 41.2 41.8  PLT 262 270    BMET  Recent Labs  08/23/14 1240 08/24/14 0730  NA 138 135*  K 4.9 4.9  CL 105 102  CO2 17* 18*  GLUCOSE 130* 107*  BUN 21 26*  CREATININE 0.96 1.35*  CALCIUM 9.2 8.3*   PT/INR No results found for this basename: LABPROT, INR,  in the last 72 hours   Recent Labs Lab 08/23/14 1240 08/24/14 0730  AST 32 117*  ALT 32 100*  ALKPHOS 133*  150*  BILITOT 0.6 1.7*  PROT 8.3 6.6  ALBUMIN 3.8 2.9*     Lipase     Component Value Date/Time   LIPASE 51 08/23/2014 1240     Studies/Results: Ct Abdomen Pelvis W Contrast  08/23/2014   CLINICAL DATA:  Severe diffuse abdominal pain. History of partial colectomy. Initial encounter.  EXAM: CT ABDOMEN AND PELVIS WITH CONTRAST  TECHNIQUE: Multidetector CT imaging of the abdomen and pelvis was performed using the standard protocol following bolus administration of intravenous contrast.  CONTRAST:  80mL OMNIPAQUE IOHEXOL 300 MG/ML  SOLN  COMPARISON:  11/04/2013; 05/22/2011  FINDINGS: Normal hepatic contour. There is a minimal amount of focal fatty infiltration adjacent to the fissure for ligamentum teres. Post cholecystectomy. There is unchanged mild dilatation of the common bile duct measuring 1.4 cm in maximal coronal dimension (coronal image 69, series 206), presumably the sequela of post cholecystectomy state. No intrahepatic biliary duct dilatation. No ascites.  There is symmetric enhancement and excretion of the bilateral kidneys. No definite renal stones on this postcontrast examination. Subcentimeter hypoattenuating lesion within the superior pole the left kidney is too small to adequately characterize of favored to represent a renal cyst. No urinary obstruction or perinephric stranding. Normal appearance of the bilateral adrenal glands, pancreas and spleen.  Incidental note is made of a punctate splenule.  The patient has undergone right-sided hemicolectomy with placement of an end ileostomy within the left lower abdominal quadrant. Re- demonstrated is a short segment peristomal hernia containing the adjacent distal aspect of the small bowel, now with trace amount of free fluid within the caudal aspect of the peristomal hernia (coronal image 18, series 206). The distal small bowel is dilated to the level of the ostomy with potential transition point at its very distal aspect (coronal image 13,  series 206). Multiple air-fluid levels are seen within distal loops of small bowel. No pneumoperitoneum, pneumatosis or portal venous  There is a minimal amount of free fluid within the right lower abdominal quadrant (image 56, series 201). No peripherally enhancing definable/drainable fluid collections. Enteric tube tip terminates within the gastric antrum.  Scattered mixed calcified and noncalcified atherosclerotic plaque within a normal caliber abdominal aorta. The major branch vessels of the abdominal aorta appear widely patent on this non CTA examination. No retroperitoneal, mesenteric, pelvic or inguinal lymphadenopathy.  Normal appearance of the pelvic organs for age. No discrete adnexal lesions. Normal appearance of the urinary bladder. No free fluid in the pelvic cul-de-sac.  Limited visualization of the lower thorax demonstrates an unchanged approximately 4 mm nodule within the subpleural aspect of the right middle lobe (image 2, series 205), grossly unchanged since the 05/2011 examination. No pleural effusion. No focal airspace opacities.  No acute or aggressive osseous abnormalities. Unchanged mild (under 25%) compression deformity involving the superior endplate of the L4 vertebral body. Remaining vertebral body heights are preserved.  Note is made of an approximately 3.7 x 1.1 cm hypo attenuating (approximately 4 Hounsfield unit) fluid collection within the subcutaneous tissues to the right lateral aspect of the umbilicus (image 55, series 201).  IMPRESSION: 1. Post right-sided hemicolectomy with end ileostomy within the left lower abdominal quadrant. The distal small bowel is dilated to the level of the ostomy with a potential transition point at the level of the ostomy. As a definitive etiology of this apparent transition point is not depicted, it is presumably secondary to adhesions. 2. 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 3. Trace amount of free fluid  within the right lower abdomen without peripheral enhancement to suggest definable/drainable fluid collection. 4. Approximately 4 mm nodule within the subpleural aspect the right middle lobe is unchanged since the 2012 examination and thus of benign etiology.   Electronically Signed   By: Simonne ComeJohn  Watts M.D.   On: 08/23/2014 23:59   Dg Abd 2 Views  08/24/2014   CLINICAL DATA:  Abdominal pain and vomiting.  History of colostomy  EXAM: ABDOMEN - 2 VIEW  COMPARISON:  08/23/2014  FINDINGS: Stable NG tube. Dilated small bowel loops have markedly improved. There is no free intraperitoneal gas.  IMPRESSION: Improved small bowel distension. NG tube is stable in the antrum of the stomach.   Electronically Signed   By: Maryclare BeanArt  Hoss M.D.   On: 08/24/2014 08:45   Dg Abd Acute W/chest  08/23/2014   CLINICAL DATA:  Abdominal pain, nausea and vomiting beginning this morning.  EXAM: ACUTE ABDOMEN SERIES (ABDOMEN 2 VIEW & CHEST 1 VIEW)  COMPARISON:  Single view of the chest and two views of the abdomen 11/08/2013.  FINDINGS: Single view of the chest demonstrates clear lungs and normal heart size. No pneumothorax or pleural effusion.  Two views of the abdomen show no free intraperitoneal air. There are gas-filled and dilated loops of  small bowel measuring up to 3.5 cm in diameter with air-fluid levels present.  IMPRESSION: Findings compatible with small-bowel obstruction.   Electronically Signed   By: Drusilla Kannerhomas  Dalessio M.D.   On: 08/23/2014 13:46   Dg Abd Portable 1v  08/23/2014   CLINICAL DATA:  Status post gastric tube placement.  EXAM: PORTABLE ABDOMEN - 1 VIEW  COMPARISON:  Contemporaneously plain film of the abdomen 08/23/2014.  FINDINGS: Interval placement of gastric tube which projects over the lower mediastinum. The tube appears to terminate in the fundus of the stomach. Side port is beyond the gastroesophageal junction. The stomach appears relatively decompressed.  Persisting dilated loops of small bowel in the lower  abdomen, partially obscured by inner luminal fluid on this supine view.  No evidence of free air on this single view.  Surgical changes of prior cholecystectomy.  Unremarkable appearance of the bony elements without displaced fracture.  IMPRESSION: Interval placement of gastric tube, which terminates along the greater curvature of stomach in the fundus. The side port is beyond the GE junction.  Persisting dilated loops of small bowel, compatible with small bowel obstruction.  Surgical changes of prior cholecystectomy.  Signed,  Yvone NeuJaime S. Loreta AveWagner, DO  [facility]  [facility]   Electronically Signed   By: Gilmer MorJaime  Wagner D.O.   On: 08/23/2014 21:36    Medications: . 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  Defer respiratory to Medicine. The ostomy has been changed and no stenosis as noted above.   LOS: 1 day    Willine Schwalbe 08/24/2014

## 2014-08-24 NOTE — Progress Notes (Signed)
Gen. Surgery attending:  Patient interviewed and examined. The daughter in room to translate. Agree with above.  Abdominal exam reveals the abdomen is soft and a little bit tender diffusely but no peritonitis. CT suggest fecal impaction just proximal to the ostomy. X-rays today show improved small bowel distention. WBC is 21,000, and that is of some concern, but does not correlate with her clinically improving state.  Earlier today, her ostomy was examined and there was no stenosis at the stoma but there was stool. Irrigation has subsequently remove some stool and some beans with return of 100 cc more than was irrigated.  Hopefully, her obstruction will be due to fecal impaction which we can resolve and avoid surgical intervention. We will follow closely. Repeat lab and x-ray and exam tomorrow morning.   Angelia MouldHaywood M. Derrell LollingIngram, M.D., [facility]ACS [facility] General and Minimally invasive Surgery Breast and Colorectal Surgery

## 2014-08-24 NOTE — Progress Notes (Signed)
Subjective:  No CP, mild tachycardia, sinus noted.   Objective:  Vital Signs in the last 24 hours: Temp:  [97.8 F (36.6 C)-99.5 F (37.5 C)] 99.5 F (37.5 C) (10/16 0745) Pulse Rate:  [69-114] 106 (10/16 0745) Resp:  [14-30] 25 (10/16 0745) BP: (112-200)/(64-110) 112/64 mmHg (10/16 0745) SpO2:  [90 %-100 %] 91 % (10/16 0745) Weight:  [103 lb 12.8 oz (47.083 kg)-108 lb 11 oz (49.3 kg)] 108 lb 11 oz (49.3 kg) (10/16 0403)  Intake/Output from previous day: 10/15 0701 - 10/16 0700 In: 750 [I.V.:750] Out: 1750 [Urine:550; Emesis/NG output:300; Stool:900]   Physical Exam: General: Well developed, well nourished, in no acute distress. Head:  Normocephalic and atraumatic. Lungs: Clear to auscultation and percussion. Heart: Tachy Reg S1 and S2.  No murmur, rubs or gallops.  Extremities: No clubbing or cyanosis. No edema. Neurologic: Alert and oriented x 3.    Lab Results:  Recent Labs  08/23/14 1240 08/24/14 0134  WBC 16.7* 21.1*  HGB 12.9 13.2  PLT 262 270    Recent Labs  08/23/14 1240  NA 138  K 4.9  CL 105  CO2 17*  GLUCOSE 130*  BUN 21  CREATININE 0.96    Recent Labs  08/23/14 2132 08/24/14 0134  TROPONINI <0.30 <0.30   Hepatic Function Panel  Recent Labs  08/23/14 1240  PROT 8.3  ALBUMIN 3.8  AST 32  ALT 32  ALKPHOS 133*  BILITOT 0.6    Imaging: Ct Abdomen Pelvis W Contrast  08/23/2014   CLINICAL DATA:  Severe diffuse abdominal pain. History of partial colectomy. Initial encounter.  EXAM: CT ABDOMEN AND PELVIS WITH CONTRAST  TECHNIQUE: Multidetector CT imaging of the abdomen and pelvis was performed using the standard protocol following bolus administration of intravenous contrast.  CONTRAST:  80mL OMNIPAQUE IOHEXOL 300 MG/ML  SOLN  COMPARISON:  11/04/2013; 05/22/2011  FINDINGS: Normal hepatic contour. There is a minimal amount of focal fatty infiltration adjacent to the fissure for ligamentum teres. Post cholecystectomy. There is  unchanged mild dilatation of the common bile duct measuring 1.4 cm in maximal coronal dimension (coronal image 69, series 206), presumably the sequela of post cholecystectomy state. No intrahepatic biliary duct dilatation. No ascites.  There is symmetric enhancement and excretion of the bilateral kidneys. No definite renal stones on this postcontrast examination. Subcentimeter hypoattenuating lesion within the superior pole the left kidney is too small to adequately characterize of favored to represent a renal cyst. No urinary obstruction or perinephric stranding. Normal appearance of the bilateral adrenal glands, pancreas and spleen. Incidental note is made of a punctate splenule.  The patient has undergone right-sided hemicolectomy with placement of an end ileostomy within the left lower abdominal quadrant. Re- demonstrated is a short segment peristomal hernia containing the adjacent distal aspect of the small bowel, now with trace amount of free fluid within the caudal aspect of the peristomal hernia (coronal image 18, series 206). The distal small bowel is dilated to the level of the ostomy with potential transition point at its very distal aspect (coronal image 13, series 206). Multiple air-fluid levels are seen within distal loops of small bowel. No pneumoperitoneum, pneumatosis or portal venous  There is a minimal amount of free fluid within the right lower abdominal quadrant (image 56, series 201). No peripherally enhancing definable/drainable fluid collections. Enteric tube tip terminates within the gastric antrum.  Scattered mixed calcified and noncalcified atherosclerotic plaque within a normal caliber abdominal aorta. The major branch vessels of the  abdominal aorta appear widely patent on this non CTA examination. No retroperitoneal, mesenteric, pelvic or inguinal lymphadenopathy.  Normal appearance of the pelvic organs for age. No discrete adnexal lesions. Normal appearance of the urinary bladder. No  free fluid in the pelvic cul-de-sac.  Limited visualization of the lower thorax demonstrates an unchanged approximately 4 mm nodule within the subpleural aspect of the right middle lobe (image 2, series 205), grossly unchanged since the 05/2011 examination. No pleural effusion. No focal airspace opacities.  No acute or aggressive osseous abnormalities. Unchanged mild (under 25%) compression deformity involving the superior endplate of the L4 vertebral body. Remaining vertebral body heights are preserved.  Note is made of an approximately 3.7 x 1.1 cm hypo attenuating (approximately 4 Hounsfield unit) fluid collection within the subcutaneous tissues to the right lateral aspect of the umbilicus (image 55, series 201).  IMPRESSION: 1. Post right-sided hemicolectomy with end ileostomy within the left lower abdominal quadrant. The distal small bowel is dilated to the level of the ostomy with a potential transition point at the level of the ostomy. As a definitive etiology of this apparent transition point is not depicted, it is presumably secondary to adhesions. 2. 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 3. Trace amount of free fluid within the right lower abdomen without peripheral enhancement to suggest definable/drainable fluid collection. 4. Approximately 4 mm nodule within the subpleural aspect the right middle lobe is unchanged since the 2012 examination and thus of benign etiology.   Electronically Signed   By: Simonne ComeJohn  Watts M.D.   On: 08/23/2014 23:59   Dg Abd Acute W/chest  08/23/2014   CLINICAL DATA:  Abdominal pain, nausea and vomiting beginning this morning.  EXAM: ACUTE ABDOMEN SERIES (ABDOMEN 2 VIEW & CHEST 1 VIEW)  COMPARISON:  Single view of the chest and two views of the abdomen 11/08/2013.  FINDINGS: Single view of the chest demonstrates clear lungs and normal heart size. No pneumothorax or pleural effusion.  Two views of the abdomen show no free intraperitoneal  air. There are gas-filled and dilated loops of small bowel measuring up to 3.5 cm in diameter with air-fluid levels present.  IMPRESSION: Findings compatible with small-bowel obstruction.   Electronically Signed   By: Drusilla Kannerhomas  Dalessio M.D.   On: 08/23/2014 13:46   Dg Abd Portable 1v  08/23/2014   CLINICAL DATA:  Status post gastric tube placement.  EXAM: PORTABLE ABDOMEN - 1 VIEW  COMPARISON:  Contemporaneously plain film of the abdomen 08/23/2014.  FINDINGS: Interval placement of gastric tube which projects over the lower mediastinum. The tube appears to terminate in the fundus of the stomach. Side port is beyond the gastroesophageal junction. The stomach appears relatively decompressed.  Persisting dilated loops of small bowel in the lower abdomen, partially obscured by inner luminal fluid on this supine view.  No evidence of free air on this single view.  Surgical changes of prior cholecystectomy.  Unremarkable appearance of the bony elements without displaced fracture.  IMPRESSION: Interval placement of gastric tube, which terminates along the greater curvature of stomach in the fundus. The side port is beyond the GE junction.  Persisting dilated loops of small bowel, compatible with small bowel obstruction.  Surgical changes of prior cholecystectomy.  Signed,  Yvone NeuJaime S. Loreta AveWagner, DO  [facility]  [facility]   Electronically Signed   By: Gilmer MorJaime  Wagner D.O.   On: 08/23/2014 21:36    Telemetry: Sinus tachy 101 currently. No adverse rhythm  Personally viewed.   Assessment/Plan:  Principal Problem:   Partial small bowel obstruction Active Problems:   Essential hypertension, benign   Atrial fibrillation   Asthma   Ileostomy status   1) Abnormal ECG - prior consult note reviewed. Agree with Dr. Delton See. Old changes. Old inferior infarct.   2) CAD - consider metoprolol IV since no PO. NSTEMI in August 2011. [facility] revealed a totally occluded RCA and moderate  disease in the LAD and left circumflex. Medical therapy was elected. EF at that time was 45-50%. She also has a h/o PAF. Currently sinus.   3) SBO - per primary team.   Will sign off, please call if ?   SKAINS, MARK 08/24/2014, 8:22 AM

## 2014-08-24 NOTE — Consult Note (Signed)
WOC ostomy consult note Stoma type/location:  Consult requested for ostomy assistance.  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  CCS at bedside to digitally assess stoma which appears to have a blockage. Stomal assessment/size: Stoma red and viable, 1 inch, flush with skin level.  Pt is able to care for her stoma prior to admission. Peristomal assessment: Intact skin surrounding; peristomal hernia visible. Output Small amt greenish stool in pouch after digital stimulation. Ostomy pouching: 1pc. pouch applied after CCS assessment, pt prefers to use a clamp instead of velcro. Education provided: Pt has language barrier but is familiar with pouch application, emptying, and obtaining supplies from previous admissions where translator was available. Supplies left at bedside. Please re-consult if further assistance is needed.  Thank-you,  Cammie Mcgeeawn Nayanna Seaborn MSN, RN, CWOCN, SmockWCN-AP, CNS [phone]

## 2014-08-25 ENCOUNTER — Inpatient Hospital Stay (HOSPITAL_COMMUNITY): Payer: Medicaid Other

## 2014-08-25 DIAGNOSIS — K5669 Other intestinal obstruction: Secondary | ICD-10-CM

## 2014-08-25 DIAGNOSIS — I2510 Atherosclerotic heart disease of native coronary artery without angina pectoris: Secondary | ICD-10-CM

## 2014-08-25 LAB — BASIC METABOLIC PANEL
Anion gap: 13 (ref 5–15)
BUN: 40 mg/dL — ABNORMAL HIGH (ref 6–23)
CO2: 17 meq/L — AB (ref 19–32)
CREATININE: 1.44 mg/dL — AB (ref 0.50–1.10)
Calcium: 8 mg/dL — ABNORMAL LOW (ref 8.4–10.5)
Chloride: 106 mEq/L (ref 96–112)
GFR calc non Af Amer: 36 mL/min — ABNORMAL LOW (ref >90)
GFR, EST AFRICAN AMERICAN: 41 mL/min — AB (ref >90)
Glucose, Bld: 92 mg/dL (ref 70–99)
POTASSIUM: 4.2 meq/L (ref 3.7–5.3)
SODIUM: 136 meq/L — AB (ref 137–147)

## 2014-08-25 LAB — CBC
HCT: 32.9 % — ABNORMAL LOW (ref 36.0–46.0)
Hemoglobin: 10.2 g/dL — ABNORMAL LOW (ref 12.0–15.0)
MCH: 27.1 pg (ref 26.0–34.0)
MCHC: 31 g/dL (ref 30.0–36.0)
MCV: 87.5 fL (ref 78.0–100.0)
Platelets: 232 10*3/uL (ref 150–400)
RBC: 3.76 MIL/uL — ABNORMAL LOW (ref 3.87–5.11)
RDW: 14.3 % (ref 11.5–15.5)
WBC: 11.6 10*3/uL — ABNORMAL HIGH (ref 4.0–10.5)

## 2014-08-25 NOTE — Progress Notes (Signed)
  Subjective: Language barrier. Per Rn has some liquid stool out today.  Objective: Vital signs in last 24 hours: Temp:  [98 F (36.7 C)-99 F (37.2 C)] 98 F (36.7 C) (10/17 0426) Pulse Rate:  [88-107] 88 (10/17 0600) Resp:  [14-26] 14 (10/17 0600) BP: (94-123)/(49-87) 119/61 mmHg (10/17 0600) SpO2:  [91 %-98 %] 91 % (10/17 0600) Weight:  [102 lb 11.8 oz (46.6 kg)] 102 lb 11.8 oz (46.6 kg) (10/17 0426) Last BM Date: 08/24/14  Intake/Output from previous day: 10/16 0701 - 10/17 0700 In: 1725 [I.V.:1725] Out: 1475 [Urine:575; Emesis/NG output:250; Stool:650] Intake/Output this shift:    Resp: clear to auscultation bilaterally Cardio: regular rate and rhythm GI: soft, mild TTP on R, dark green output from stoma  Lab Results:   Recent Labs  08/24/14 0134 08/25/14 0311  WBC 21.1* 11.6*  HGB 13.2 10.2*  HCT 41.8 32.9*  PLT 270 232   BMET  Recent Labs  08/24/14 0730 08/25/14 0311  NA 135* 136*  K 4.9 4.2  CL 102 106  CO2 18* 17*  GLUCOSE 107* 92  BUN 26* 40*  CREATININE 1.35* 1.44*  CALCIUM 8.3* 8.0*    Anti-infectives: Anti-infectives   None      Assessment/Plan: PSBO and peristomal hernia - seems to be improving, continue NGT, allow ice, check F/U films  LOS: 2 days    Anushka Hartinger E 08/25/2014

## 2014-08-25 NOTE — Progress Notes (Signed)
Subjective: [name] was interviewed with the assistance of a telephone interpreter.  She reports feeling much better than at admission.  She denies N/V.  Her abdominal pain is improving.  She would like to know if the NG tube can come out and if she can eat.  She reports throat irritation from NG.  She is hungry.  Objective: Vital signs in last 24 hours: Filed Vitals:   08/25/14 0000 08/25/14 0004 08/25/14 0426 08/25/14 0600  BP: 110/69 110/69 118/64 119/61  Pulse: 99 99 95 88  Temp:  98.5 F (36.9 C) 98 F (36.7 C)   TempSrc:  Oral Oral   Resp: 18 18 23 14   Height:      Weight:   46.6 kg (102 lb 11.8 oz)   SpO2: 92% 93% 95% 91%   Weight change: -0.483 kg (-1 lb 1.1 oz)  Intake/Output Summary (Last 24 hours) at 08/25/14 [id number] Last data filed at 08/25/14 0600  Gross per 24 hour  Intake   1725 ml  Output   1475 ml  Net    250 ml   Gen: A&O x 4, no acute distress, she is able to sit up and interact, she is able to assist RN and tech with emptying her ostomy bag. HEENT: [place]/AT Heart: Regular rate and rhythm, normal S1 S2, no murmurs, rubs, or gallops Lungs: Clear to auscultation bilaterally, respirations unlabored Abd: RLQ ostomy with dark stool output (looks watery but tech has added water to rinse out the bag while emptying), no surrounding erythema. Surgical scars. Mild distention, RLQ TTP, no rebound/guarding, +BS Ext: No edema or cyanosis NG:  600cc greenish/black NG output in container  Lab Results: Basic Metabolic Panel:  Recent Labs Lab 08/23/14 1253 08/24/14 0730 08/25/14 0311  NA  --  135* 136*  K  --  4.9 4.2  CL  --  102 106  CO2  --  18* 17*  GLUCOSE  --  107* 92  BUN  --  26* 40*  CREATININE  --  1.35* 1.44*  CALCIUM  --  8.3* 8.0*  MG 1.8  --   --    CBC:  Recent Labs Lab 08/23/14 1240 08/24/14 0134 08/25/14 0311  WBC 16.7* 21.1* 11.6*  NEUTROABS 15.5*  --   --   HGB 12.9 13.2 10.2*  HCT 41.2 41.8 32.9*  MCV 86.7 87.1 87.5  PLT 262 270  232   Medications: I have reviewed the patient's current medications. Scheduled Meds: . heparin  5,000 Units Subcutaneous 3 times per day  . Influenza vac split quadrivalent PF  0.5 mL Intramuscular Tomorrow-1000  . pneumococcal 23 valent vaccine  0.5 mL Intramuscular Tomorrow-1000   Continuous Infusions: . sodium chloride 75 mL/hr at 08/24/14 2345   PRN Meds:.acetaminophen, acetaminophen, albuterol, morphine injection Assessment/Plan:  Small bowel obstruction, likely partial: abdominal xray on 10/16 showed improvement in small bowel distension NG tube in appropriate position.  The patient feels better and would like to eat.  She is still having significant NG output.  Appreciate surgery and wound care recommendations. - continue NPO but allow ice per surgery rec - cont NG tube for decompression  - continue IV zofran PRN nausea  - continue Morphine for pain  - continue IV fluids  - continue to close monitoring in SDU - f/u abdominal film   ST elevations on EKG: Patient denying any chest pain, troponins negative x1. Previous EKGs from at least December 2014 have shown that this may not  be new.  - Cardiology consulted and not concerned as this is her baseline  Coronary artery disease (status post and stenting in 2011): The son denies that patient is on any medications for any heart disease and also denies that his mother has a history of heart disease. However, upon chart review, patient has had cardiac catheterization with multivessel disease in 2011. A discharge summary from 11/11/2013 indicates that the patient was discharged with enalapril 5 mg twice a day, metoprolol 12.5 mg twice a day, and simvastatin 20 mg daily. Faxed PCP note from 9/25 states patient is prescribed amlodipine 5 mg po daily, clopidogrel 75 mg po daily, enalapril 5 mg po BID, nitrostat 0.4 mg sl q6hprn, metoprolol 12.5 po BID, and simvastatin 20 mg po daily.  - hold possible home medications pending current n.p.o.  status  - will clarify medication regimen with patient and family  HTN: Hypertensive on admission at 189/72 but has since been low at 90s-110s/40s-70s. Home BP regimen as documented above. - hold BP meds for now as she is low and npo - continue to monitor vitals - can use IV antihypertensive prn if BPs become elevated  Asthma:  - albuterol prn - can resume home Advair   Diet:  NPO VTE: Heparin  Code:  Full  Dispo: Disposition is deferred at this time, awaiting improvement of current medical problems.  Anticipated discharge in approximately 2 day(s).   The patient does have a current PCP (Rometta EmeryMohammad L Garba, MD) and [facility] after discharge.  The patient does not know have transportation limitations that hinder transportation to clinic appointments.  .Services Needed at time of discharge: Y = Yes, Blank = No PT:   OT:   RN:   Equipment:   Other:     LOS: 2 days   Yolanda MangesAlex M Karon Cotterill, DO 08/25/2014, 7:21 AM

## 2014-08-26 LAB — URINE CULTURE

## 2014-08-26 LAB — CBC
HCT: 28.5 % — ABNORMAL LOW (ref 36.0–46.0)
Hemoglobin: 9 g/dL — ABNORMAL LOW (ref 12.0–15.0)
MCH: 27.1 pg (ref 26.0–34.0)
MCHC: 31.6 g/dL (ref 30.0–36.0)
MCV: 85.8 fL (ref 78.0–100.0)
PLATELETS: 234 10*3/uL (ref 150–400)
RBC: 3.32 MIL/uL — AB (ref 3.87–5.11)
RDW: 14.3 % (ref 11.5–15.5)
WBC: 11.5 10*3/uL — ABNORMAL HIGH (ref 4.0–10.5)

## 2014-08-26 LAB — BASIC METABOLIC PANEL
Anion gap: 16 — ABNORMAL HIGH (ref 5–15)
BUN: 41 mg/dL — ABNORMAL HIGH (ref 6–23)
CALCIUM: 8.3 mg/dL — AB (ref 8.4–10.5)
CO2: 22 mEq/L (ref 19–32)
CREATININE: 1.19 mg/dL — AB (ref 0.50–1.10)
Chloride: 107 mEq/L (ref 96–112)
GFR, EST AFRICAN AMERICAN: 52 mL/min — AB (ref >90)
GFR, EST NON AFRICAN AMERICAN: 45 mL/min — AB (ref >90)
GLUCOSE: 73 mg/dL (ref 70–99)
POTASSIUM: 3.8 meq/L (ref 3.7–5.3)
Sodium: 145 mEq/L (ref 137–147)

## 2014-08-26 LAB — TROPONIN I: Troponin I: <0.3 ng/mL (ref <0.30)

## 2014-08-26 MED ORDER — DEXTROSE-NACL 5-0.45 % IV SOLN
INTRAVENOUS | Status: DC
  Administered 2014-08-26: 17:00:00 via INTRAVENOUS
  Administered 2014-08-27: 1000 mL via INTRAVENOUS
  Administered 2014-08-27: 21:00:00 via INTRAVENOUS

## 2014-08-26 NOTE — Progress Notes (Signed)
Patient ID: [name], female   DOB: [birth year], 73 y.o.   MRN: [id number]    Subjective: Pt's NGT is disconnected and leaking dark thick bile onto bed.    Objective: Vital signs in last 24 hours: Temp:  [97.8 F (36.6 C)-99.3 F (37.4 C)] 97.8 F (36.6 C) (10/18 0810) Pulse Rate:  [78-107] 99 (10/18 0810) Resp:  [13-23] 22 (10/18 0810) BP: (104-146)/(51-81) 129/70 mmHg (10/18 0810) SpO2:  [87 %-97 %] 91 % (10/18 0810) Weight:  [101 lb 3.1 oz (45.9 kg)] 101 lb 3.1 oz (45.9 kg) (10/18 0427) Last BM Date: 08/25/14  Intake/Output from previous day: 10/17 0701 - 10/18 0700 In: 1490 [P.O.:140; I.V.:1350] Out: 2225 [Urine:1025; Emesis/NG output:900; Stool:300] Intake/Output this shift: Total I/O In: -  Out: 50 [Emesis/NG output:50]  Resp: clear to auscultation bilaterally Cardio: regular rate and rhythm GI: soft, mild TTP on R, dark green output from stoma, sl distention.  Flatus in ostomy bag.    Lab Results:   Recent Labs  08/25/14 0311 08/26/14 0320  WBC 11.6* 11.5*  HGB 10.2* 9.0*  HCT 32.9* 28.5*  PLT 232 234   BMET  Recent Labs  08/25/14 0311 08/26/14 0320  NA 136* 145  K 4.2 3.8  CL 106 107  CO2 17* 22  GLUCOSE 92 73  BUN 40* 41*  CREATININE 1.44* 1.19*  CALCIUM 8.0* 8.3*    Anti-infectives: Anti-infectives   None      Assessment/Plan: PSBO and peristomal hernia -  Pt appears to have ileus.  No evidence of true obstruction with gas and stool in bag.  Continue stomal irrigation.     LOS: 3 days    Tasneem Cormier 08/26/2014

## 2014-08-26 NOTE — Progress Notes (Addendum)
Subjective: [name] was interviewed with the assistance of a telephone interpreter.  She reports having abdominal pain but says it is better than at admission.  She points to her epigatric area and says "my heart hurts."  She is hungry and wants to know when the tube can come out.  Objective: Vital signs in last 24 hours: Filed Vitals:   08/26/14 0600 08/26/14 0810 08/26/14 1000 08/26/14 1212  BP: 130/62 129/70 104/89 131/73  Pulse: 78 99 88 81  Temp:  97.8 F (36.6 C)  98.5 F (36.9 C)  TempSrc:  Oral  Oral  Resp: 13 22 24 15   Height:      Weight:      SpO2: 95% 91% 97% 94%   Weight change: -0.7 kg (-1 lb 8.7 oz)  Intake/Output Summary (Last 24 hours) at 08/26/14 1245 Last data filed at 08/26/14 0800  Gross per 24 hour  Intake   1035 ml  Output   1700 ml  Net   -665 ml   Gen: A&O x 4, no acute distress HEENT: [place]/AT Heart: Regular rate and rhythm, normal S1 S2, no murmurs, rubs, or gallops Lungs: Clear to auscultation bilaterally, respirations unlabored Abd: RLQ ostomy with small amount of residual dark stool, no surrounding erythema. Surgical scars. Mild distention, RLQ TTP, no rebound/guarding, +BS Ext: No edema or cyanosis NG:  700cc dark NG output in container  Lab Results: Basic Metabolic Panel:  Recent Labs Lab 08/23/14 1253  08/25/14 0311 08/26/14 0320  NA  --   < > 136* 145  K  --   < > 4.2 3.8  CL  --   < > 106 107  CO2  --   < > 17* 22  GLUCOSE  --   < > 92 73  BUN  --   < > 40* 41*  CREATININE  --   < > 1.44* 1.19*  CALCIUM  --   < > 8.0* 8.3*  MG 1.8  --   --   --   < > = values in this interval not displayed. CBC:  Recent Labs Lab 08/23/14 1240  08/25/14 0311 08/26/14 0320  WBC 16.7*  < > 11.6* 11.5*  NEUTROABS 15.5*  --   --   --   HGB 12.9  < > 10.2* 9.0*  HCT 41.2  < > 32.9* 28.5*  MCV 86.7  < > 87.5 85.8  PLT 262  < > 232 234  < > = values in this interval not displayed. Medications: I have reviewed the patient's current  medications. Scheduled Meds: . heparin  5,000 Units Subcutaneous 3 times per day   Continuous Infusions: . sodium chloride 1,000 mL (08/25/14 2056)   PRN Meds:.acetaminophen, acetaminophen, albuterol, morphine injection  Assessment/Plan:  Small bowel obstruction, likely partial vs ileus: 10/17 abdominal xray revealed nearly gasless abdomen w/o evidence of obstruction.  The patient feels better and would like to eat.  She is still having significant NG output and her abdominal pain has not completely resolved.  Appreciate surgery and wound care recommendations. - continue NPO but allow ice per surgery rec - cont NG tube for decompression  - continue IV zofran PRN nausea  - continue Morphine for pain  - continue IV fluids (stop NS and start D5-0.5NS given rise in Na and NPO status for several days) - continue to close monitoring in SD  ST elevations on EKG: Patient initially denied any chest pain, troponins negative x1. Previous EKGs from  at least December 2014 have shown that this may not be new.  When questioned (with interpreter phone) she says her "heart hurts" but points to her abdomen.  When asked when it started she says last Thursday (the day she was admitted).  She says the pain has gotten better.  I think this may be related to language barrier and that she is referring to abdominal pain but given her history I will check EKG and trend trop again. - Cardiology consulted at admission and not concerned as this is her baseline. - repeat EKG - trop x 3  Coronary artery disease (status post and stenting in 2011): The son denies that patient is on any medications for any heart disease and also denies that his mother has a history of heart disease. However, upon chart review, patient has had cardiac catheterization with multivessel disease in 2011. A discharge summary from 11/11/2013 indicates that the patient was discharged with enalapril 5 mg twice a day, metoprolol 12.5 mg twice a day,  and simvastatin 20 mg daily. Faxed PCP note from 9/25 states patient is prescribed amlodipine 5 mg po daily, clopidogrel 75 mg po daily, enalapril 5 mg po BID, nitrostat 0.4 mg sl q6hprn, metoprolol 12.5 po BID, and simvastatin 20 mg po daily.  - hold home medications pending current n.p.o. status  - will clarify medication regimen with patient and family  HTN: Hypertensive on admission at 189/72 but has since been low to normotensive. Home BP regimen as documented above. - hold BP meds for now as she is low and npo - continue to monitor vitals - can use IV antihypertensive prn if BPs become elevated  Asthma:  - albuterol prn - can resume home Advair if needed  Diet:  NPO with ice VTE: Heparin  Code:  Full  Dispo: Disposition is deferred at this time, awaiting improvement of current medical problems.  Anticipated discharge in approximately 2 day(s).   The patient does have a current PCP (Rometta EmeryMohammad L Garba, MD) and [facility].  The patient does not know have transportation limitations that hinder transportation to clinic appointments.  .Services Needed at time of discharge: Y = Yes, Blank = No PT:   OT:   RN:   Equipment:   Other:     LOS: 3 days   Yolanda MangesAlex M Shekita Boyden, DO 08/26/2014, 12:45 PM

## 2014-08-27 ENCOUNTER — Inpatient Hospital Stay (HOSPITAL_COMMUNITY): Payer: Medicaid Other

## 2014-08-27 LAB — TROPONIN I: Troponin I: <0.3 ng/mL (ref <0.30)

## 2014-08-27 LAB — CBC
HCT: 26.7 % — ABNORMAL LOW (ref 36.0–46.0)
HEMATOCRIT: 25.2 % — AB (ref 36.0–46.0)
HEMATOCRIT: 27.3 % — AB (ref 36.0–46.0)
HEMOGLOBIN: 8.5 g/dL — AB (ref 12.0–15.0)
Hemoglobin: 8.3 g/dL — ABNORMAL LOW (ref 12.0–15.0)
Hemoglobin: 8 g/dL — ABNORMAL LOW (ref 12.0–15.0)
MCH: 27.6 pg (ref 26.0–34.0)
MCH: 27.6 pg (ref 26.0–34.0)
MCH: 28 pg (ref 26.0–34.0)
MCHC: 31.1 g/dL (ref 30.0–36.0)
MCHC: 31.7 g/dL (ref 30.0–36.0)
MCHC: 31.1 g/dL (ref 30.0–36.0)
MCV: 88.7 fL (ref 78.0–100.0)
MCV: 86.9 fL (ref 78.0–100.0)
MCV: 89.8 fL (ref 78.0–100.0)
PLATELETS: 263 10*3/uL (ref 150–400)
Platelets: 235 10*3/uL (ref 150–400)
Platelets: 240 10*3/uL (ref 150–400)
RBC: 3.01 MIL/uL — ABNORMAL LOW (ref 3.87–5.11)
RBC: 2.9 MIL/uL — ABNORMAL LOW (ref 3.87–5.11)
RBC: 3.04 MIL/uL — AB (ref 3.87–5.11)
RDW: 14.7 % (ref 11.5–15.5)
RDW: 14.6 % (ref 11.5–15.5)
RDW: 14.6 % (ref 11.5–15.5)
WBC: 10.4 10*3/uL (ref 4.0–10.5)
WBC: 9.7 10*3/uL (ref 4.0–10.5)
WBC: 8.8 10*3/uL (ref 4.0–10.5)

## 2014-08-27 LAB — BASIC METABOLIC PANEL
Anion gap: 10 (ref 5–15)
BUN: 26 mg/dL — ABNORMAL HIGH (ref 6–23)
CO2: 27 meq/L (ref 19–32)
Calcium: 8.2 mg/dL — ABNORMAL LOW (ref 8.4–10.5)
Chloride: 109 mEq/L (ref 96–112)
Creatinine, Ser: 0.98 mg/dL (ref 0.50–1.10)
GFR calc Af Amer: 66 mL/min — ABNORMAL LOW (ref >90)
GFR calc non Af Amer: 57 mL/min — ABNORMAL LOW (ref >90)
GLUCOSE: 135 mg/dL — AB (ref 70–99)
Potassium: 3.4 mEq/L — ABNORMAL LOW (ref 3.7–5.3)
Sodium: 146 mEq/L (ref 137–147)

## 2014-08-27 LAB — OCCULT BLOOD X 1 CARD TO LAB, STOOL: Fecal Occult Bld: POSITIVE — AB

## 2014-08-27 MED ORDER — POTASSIUM CHLORIDE 10 MEQ/100ML IV SOLN
10.0000 meq | INTRAVENOUS | Status: AC
  Administered 2014-08-27 (×4): 10 meq via INTRAVENOUS
  Filled 2014-08-27 (×4): qty 100

## 2014-08-27 MED ORDER — PANTOPRAZOLE SODIUM 40 MG IV SOLR
40.0000 mg | Freq: Every day | INTRAVENOUS | Status: DC
  Administered 2014-08-27: 40 mg via INTRAVENOUS
  Filled 2014-08-27: qty 40

## 2014-08-27 MED ORDER — MOMETASONE FURO-FORMOTEROL FUM 100-5 MCG/ACT IN AERO
2.0000 | INHALATION_SPRAY | Freq: Two times a day (BID) | RESPIRATORY_TRACT | Status: DC
  Administered 2014-08-27 – 2014-08-30 (×6): 2 via RESPIRATORY_TRACT
  Filled 2014-08-27: qty 8.8

## 2014-08-27 MED ORDER — MENTHOL 3 MG MT LOZG
1.0000 | LOZENGE | OROMUCOSAL | Status: DC | PRN
  Filled 2014-08-27: qty 9

## 2014-08-27 MED ORDER — PHENOL 1.4 % MT LIQD
1.0000 | OROMUCOSAL | Status: DC | PRN
  Administered 2014-08-27: 1 via OROMUCOSAL
  Filled 2014-08-27: qty 177

## 2014-08-27 MED ORDER — PANTOPRAZOLE SODIUM 40 MG IV SOLR
40.0000 mg | Freq: Two times a day (BID) | INTRAVENOUS | Status: DC
  Administered 2014-08-27 – 2014-08-29 (×3): 40 mg via INTRAVENOUS
  Filled 2014-08-27 (×5): qty 40

## 2014-08-27 MED ORDER — PANTOPRAZOLE SODIUM 40 MG IV SOLR: 40.0000 mg | Freq: Every day | INTRAVENOUS | Status: DC

## 2014-08-27 NOTE — Progress Notes (Signed)
Subjective: [name] was interviewed with the assistance of a telephone interpreter.  She continues to report burning pain that comes and goes in her epigastric area and calls it her "heart." She says it is the same pain that was assessed yesterday but that it has improved overnight. I reassured her that we ran several tests on her heart from this pain yesterday (troponin x3 and EKG) and they were all negative. No other complaints.  Objective: Vital signs in last 24 hours: Filed Vitals:   08/26/14 2357 08/27/14 0345 08/27/14 0800 08/27/14 0803  BP: 129/51 126/61 138/97   Pulse: 82 73 86   Temp: 99.1 F (37.3 C) 98.5 F (36.9 C)  98.4 F (36.9 C)  TempSrc: Oral Oral    Resp: 16 13 14    Height:      Weight:  104 lb 4.4 oz (47.3 kg)    SpO2: 96% 93% 88%    Weight change: 3 lb 1.4 oz (1.4 kg)  Intake/Output Summary (Last 24 hours) at 08/27/14 1104 Last data filed at 08/27/14 1037  Gross per 24 hour  Intake 2321.25 ml  Output   1550 ml  Net 771.25 ml  275 NG, 125 stool yesterday  Gen: A&O x 4, no acute distress Heart: Regular rate and rhythm, normal S1 S2, no murmurs, rubs, or gallops Lungs: Diffuse wheezes bilaterally, respirations unlabored Abd: RLQ ostomy with small amount of residual dark stool, no surrounding erythema. Surgical scars. Mild distention, RLQ TTP, no rebound/guarding, +BS Ext: No edema or cyanosis NG:  250cc dark NG output in container  Lab Results: Basic Metabolic Panel:  Recent Labs Lab 08/23/14 1253  08/26/14 0320 08/27/14 0329  NA  --   < > 145 146  K  --   < > 3.8 3.4*  CL  --   < > 107 109  CO2  --   < > 22 27  GLUCOSE  --   < > 73 135*  BUN  --   < > 41* 26*  CREATININE  --   < > 1.19* 0.98  CALCIUM  --   < > 8.3* 8.2*  MG 1.8  --   --   --   < > = values in this interval not displayed. CBC:  Recent Labs Lab 08/23/14 1240  08/26/14 0320 08/27/14 0329  WBC 16.7*  < > 11.5* 10.4  NEUTROABS 15.5*  --   --   --   HGB 12.9  < > 9.0*  8.3*  HCT 41.2  < > 28.5* 26.7*  MCV 86.7  < > 85.8 88.7  PLT 262  < > 234 235  < > = values in this interval not displayed. Medications: I have reviewed the patient's current medications. Scheduled Meds: . heparin  5,000 Units Subcutaneous 3 times per day  . mometasone-formoterol  2 puff Inhalation BID  . pantoprazole (PROTONIX) IV  40 mg Intravenous Daily  . potassium chloride  10 mEq Intravenous Q1 Hr x 4   Continuous Infusions: . dextrose 5 % and 0.45% NaCl 1,000 mL (08/27/14 0613)   PRN Meds:.acetaminophen, acetaminophen, albuterol, menthol-cetylpyridinium, morphine injection, phenol  Assessment/Plan:  Small bowel obstruction, likely partial vs ileus: 10/17 abdominal xray revealed nearly gasless abdomen w/o evidence of obstruction.  The patient feels better and would like to eat.  She is still having significant NG output and her abdominal pain has not completely resolved.  Appreciate surgery and wound care recommendations. - repeat abdominal films and reassess  need for NG tube - continue NPO but allow ice per surgery rec - continue IV zofran PRN nausea  - continue Morphine for pain  - continue D5-1/2 NS IV fluids - may transfer to gen/surg  Hypokalemia: K 3.4 this morning. Likely 2/2 NG suction of KCl -supplement KCl 40 mEq IV and reassess  Abdominal pain: Likely 2/2 to partial obstruction v ileus. Also consider GERD, gastritis, PUD. Her hgb is trending down (8.3 today down from 12.9 on admission) and dark appearance of NG output raise suspicion for GI bleed. Hemodynamically stable. She had anemia work-up in past suggestive of anemia of chronic disease. -protonix 40 mg IV daily -FOBC -repeat CBC in pm -cont to monitor  ST elevations on EKG: Patient initially denied any chest pain, troponins negative x1. Previous EKGs from at least December 2014 have shown that this may not be new.  When questioned (with interpreter phone) yesterday, she said her "heart hurts" but points to  her abdomen. Troponin were negative x 3 and EKG unchanged. She reports having the same sensation today which is discussed above. - Cardiology consulted at admission and not concerned as this is her baseline.  Coronary artery disease (status post and stenting in 2011): The son denies that patient is on any medications for any heart disease and also denies that his mother has a history of heart disease. However, upon chart review, patient has had cardiac catheterization with multivessel disease in 2011. A discharge summary from 11/11/2013 indicates that the patient was discharged with enalapril 5 mg twice a day, metoprolol 12.5 mg twice a day, and simvastatin 20 mg daily. Faxed PCP note from 9/25 states patient is prescribed amlodipine 5 mg po daily, clopidogrel 75 mg po daily, enalapril 5 mg po BID, nitrostat 0.4 mg sl q6hprn, metoprolol 12.5 po BID, and simvastatin 20 mg po daily.  - hold home medications pending current n.p.o. status  - will clarify medication regimen with patient and family  HTN: Hypertensive on admission at 189/72 but has since been low to normotensive. Home BP regimen as documented above. - hold BP meds for now as she is low and npo - continue to monitor vitals - can use IV antihypertensive prn if BPs become elevated  Asthma: Patient wheezing on exam. On advair at home -dulera 2 puff BID and albuterol q2hprn   Diet:  NPO with ice VTE: Heparin  Code:  Full  Dispo: Disposition is deferred at this time, awaiting improvement of current medical problems.  Anticipated discharge in approximately 2 day(s).   The patient does have a current PCP (Regina EmeryMohammad L Garba, MD) and [facility] after discharge.  The patient does not know have transportation limitations that hinder transportation to clinic appointments.  .Services Needed at time of discharge: Y = Yes, Blank = No PT:   OT:   RN:   Equipment:   Other:     LOS: 4 days   Regina HatchetAdam L  Maclovio Henson, MD 08/27/2014, 11:04 AM

## 2014-08-27 NOTE — Consult Note (Signed)
Referring Provider: Dr. Heide SparkNarendra Primary Care Physician:  Lonia BloodGARBA,LAWAL, MD Primary Gastroenterologist:  Gentry FitzUnassigned  Reason for Consultation:  GI bleed  HPI: [name] is a 73 y.o. [place] female seen for a consult due to a GI bleed with black colored fluid from NG tube drainage. Patient in the hospital for abdominal pain and nausea and thought to have a partial SBO. 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 EGD in 10/2013 by Dr. Elnoria HowardHung showed a gastric ulcer with a large clot. Hgb has dropped to 8.0 today from 10.2 on 08/25/14. Having dark green stool in ostomy. Patient complaining of epigastric pain. Patient does not speak [place] and family does not know [place]. History obtained from chart and from speaking to patient through telephone translator.     Past Medical History  Diagnosis Date  . Asthma   . NSTEMI (non-ST elevated myocardial infarction) 2011    medically managed  . Paroxysmal atrial fibrillation     Hx of amiodarone use  . Acute gastric ulcer with bleeding 11/09/2013    Past Surgical History  Procedure Laterality Date  . Colostomy Left 2011    Dr. Carolynne Edouardoth  . Partial colectomy  2011    necrotic bowel after lap chole  . Laparoscopic cholecystectomy  2011  . Ercp  2011  . Cardiac catheterization  2011    100% occlusion RCA, 80% stenosis circumflex, 60% stenosis LAD  . Esophagogastroduodenoscopy N/A 11/08/2013    Procedure: ESOPHAGOGASTRODUODENOSCOPY (EGD);  Surgeon: Theda BelfastPatrick D Hung, MD;  [facility];  Service: Endoscopy;  Laterality: N/A;    Prior to Admission medications   Medication Sig Start Date End Date Taking? Authorizing Provider  albuterol (PROVENTIL HFA;VENTOLIN HFA) 108 (90 BASE) MCG/ACT inhaler Inhale 1 puff into the lungs every 6 (six) hours as needed for wheezing or shortness of breath.   Yes Historical  Provider, MD  Fluticasone-Salmeterol (ADVAIR) 250-50 MCG/DOSE AEPB Inhale 1 puff into the lungs 2 (two) times daily.   Yes Historical Provider, MD    Scheduled Meds: . heparin  5,000 Units Subcutaneous 3 times per day  . mometasone-formoterol  2 puff Inhalation BID  . pantoprazole (PROTONIX) IV  40 mg Intravenous Daily   Continuous Infusions: . dextrose 5 % and 0.45% NaCl 1,000 mL (08/27/14 0613)   PRN Meds:.acetaminophen, acetaminophen, albuterol, menthol-cetylpyridinium, morphine injection, phenol  Allergies as of 08/23/2014  . (No Known Allergies)    History reviewed. No pertinent family history.  History   Social History  . Marital Status: Married    Spouse Name: N/A    Number of Children: N/A  . Years of Education: N/A   Occupational History  . Not on file.   Social History Main Topics  . Smoking status: Never Smoker   . Smokeless tobacco: Not on file  . Alcohol Use: No  . Drug Use: No  . Sexual Activity:    Other Topics Concern  . Not on file   Social History Narrative  . No narrative on file    Review of Systems: All negative from a GI standpoint except as stated above in HPI.  Physical Exam: Vital signs: Filed Vitals:   08/27/14 1330  BP: 137/73  Pulse: 83  Temp: 98.7 F (37.1 C)  Resp: 18   Last BM Date: 08/27/14 General:   Elderly, frail, Alert, Well-developed, well-nourished, no acute distress HEENT:  anicteric; NG noted (black fluid in tubing) Lungs:  Clear throughout to auscultation.   No wheezes, crackles, or rhonchi. No acute distress. Heart:  Regular rate and rhythm; no murmurs, clicks, rubs,  or gallops. Abdomen: epigastric tenderness with guarding, soft, mild distention, ostomy intact with small amount of dark green semi-formed stool, +BS  Rectal:  Deferred Ext: no edema  GI:  Lab Results:  Recent Labs  08/27/14 0329 08/27/14 1254 08/27/14 1843  WBC 10.4 9.7 8.8  HGB 8.3* 8.0* 8.5*  HCT 26.7* 25.2* 27.3*  PLT 235 263 240    BMET  Recent Labs  08/25/14 0311 08/26/14 0320 08/27/14 0329  NA 136* 145 146  K 4.2 3.8 3.4*  CL 106 107 109  CO2 17* 22 27  GLUCOSE 92 73 135*  BUN 40* 41* 26*  CREATININE 1.44* 1.19* 0.98  CALCIUM 8.0* 8.3* 8.2*   LFT No results found for this basename: PROT, ALBUMIN, AST, ALT, ALKPHOS, BILITOT, BILIDIR, IBILI,  in the last 72 hours PT/INR No results found for this basename: LABPROT, INR,  in the last 72 hours   Studies/Results: Dg Abd 2 Views  08/27/2014   CLINICAL DATA:  Abdominal pain, followup small bowel dilatation  EXAM: ABDOMEN - 2 VIEW  COMPARISON:  08/25/2014  FINDINGS: Scattered small bowel gas is noted. No obstructive pattern is seen. A nasogastric catheter remains in the distal stomach. No free air is noted. No acute bony abnormality is seen.  IMPRESSION: Nasogastric catheter within the stomach.  No obstructive process is identified.   Electronically Signed   By: Mark  Lukens M.D.   On: 08/27/2014 11:27    Impression/Plan: 73 yo with partial SBO with black NG drainage and falling Hgb concerning for an upper GI bleed. Peptic ulcer vs. NG trauma vs. Gastritis. EGD needed in AM to further evaluate and consent obtained with use of phone translator and nurse verified. Change to IV PPI Q 12 hours. NPO. Dr. Hayes to do EGD tomorrow, time to be determined.    LOS: 4 days   Anastashia Westerfeld C.  08/27/2014, 7:45 PM   

## 2014-08-27 NOTE — Progress Notes (Signed)
Patient ID: [name], female   DOB: 07-Jun-1941, 73 y.o.   MRN: [id number]    Subjective: Pt feels ok today.  No pain.  C/o throat pain  Objective: Vital signs in last 24 hours: Temp:  [98.1 F (36.7 C)-99.1 F (37.3 C)] 98.4 F (36.9 C) (10/19 0803) Pulse Rate:  [73-88] 73 (10/19 0345) Resp:  [13-24] 13 (10/19 0345) BP: (104-131)/(39-89) 126/61 mmHg (10/19 0345) SpO2:  [91 %-97 %] 93 % (10/19 0345) Weight:  [104 lb 4.4 oz (47.3 kg)] 104 lb 4.4 oz (47.3 kg) (10/19 0345) Last BM Date: 08/26/14  Intake/Output from previous day: 10/18 0701 - 10/19 0700 In: 1796.3 [I.V.:1796.3] Out: 1375 [Urine:975; Emesis/NG output:275; Stool:125] Intake/Output this shift: Total I/O In: 75 [I.V.:75] Out: 225 [Urine:225]  PE: Abd: soft, +BS, NT, ostomy with some air and a small amount of liquid stool. Heart: regular LungS: CTAB  Lab Results:   Recent Labs  08/25/14 0311 08/26/14 0320  WBC 11.6* 11.5*  HGB 10.2* 9.0*  HCT 32.9* 28.5*  PLT 232 234   BMET  Recent Labs  08/26/14 0320 08/27/14 0329  NA 145 146  K 3.8 3.4*  CL 107 109  CO2 22 27  GLUCOSE 73 135*  BUN 41* 26*  CREATININE 1.19* 0.98  CALCIUM 8.3* 8.2*   PT/INR No results found for this basename: LABPROT, INR,  in the last 72 hours CMP     Component Value Date/Time   NA 146 08/27/2014 0329   K 3.4* 08/27/2014 0329   CL 109 08/27/2014 0329   CO2 27 08/27/2014 0329   GLUCOSE 135* 08/27/2014 0329   BUN 26* 08/27/2014 0329   CREATININE 0.98 08/27/2014 0329   CALCIUM 8.2* 08/27/2014 0329   PROT 6.6 08/24/2014 0730   ALBUMIN 2.9* 08/24/2014 0730   AST 117* 08/24/2014 0730   ALT 100* 08/24/2014 0730   ALKPHOS 150* 08/24/2014 0730   BILITOT 1.7* 08/24/2014 0730   GFRNONAA 57* 08/27/2014 0329   GFRAA 66* 08/27/2014 0329   Lipase     Component Value Date/Time   LIPASE 51 08/23/2014 1240       Studies/Results: Dg Abd 2 Views  08/25/2014   CLINICAL DATA:  Abdominal pain.  Small-bowel obstruction.   EXAM: ABDOMEN - 2 VIEW  COMPARISON:  CT abdomen and pelvis 08/23/2014. Plain films of the abdomen 08/24/2014.  FINDINGS: NG tube remains in place with the tip in the distal stomach. No free intraperitoneal air is identified. The abdomen is nearly gasless. Two gas-filled but nondilated loops of small bowel are identified. Cholecystectomy clips are noted.  IMPRESSION: NG tube in good position.  Nearly gasless abdomen without evidence of obstruction.   Electronically Signed   By: Drusilla Kannerhomas  Dalessio M.D.   On: 08/25/2014 14:09    Anti-infectives: Anti-infectives   None       Assessment/Plan  1. pSBO 2. Peristomal hernia  Plan: 1. Patient has had her ostomy irrigated with good results.  She has air and some stool in her bag, but still put out about 500cc of bilious output from her NGT yesterday.  Will repeat abdominal films today to determine if NGT is able to be removed.  (D/W patient via translator)   LOS: 4 days    Vertie Dibbern E 08/27/2014, 8:34 AM Pager: [phone]

## 2014-08-27 NOTE — Progress Notes (Signed)
Pt seen and examined with Dr. Andrey CampanileWilson and Dr. Valentino Saxonothman. Please refer to resident note for details. Pt interviewed with help of telephone interpreter. She complains of intermittent epigastric burning pain and also complains of difficulty swallowing her saliva secondary to NG tube in moutth  Exam:  Gen: AAO*3, NAD  Cardio: RRR, normal heart sounds  Lungs: diffuse b/l wheezes + Abd: soft, non tender, BS +, Ostomy with stool  Ext: no edema   Assessment and Plan:  73 y/o female with likely partial SBO v/s ileus  SBO:  - Surgery following - no surgical intervention at this time  - repeat abd xray with no evidence of obstruction - Will attempt to d/c NG tube today if ok with surgery - Started on protonix for burning epigastric pain  Anemia: - Pt with worsening anemia. - c/w PPI - repeat CBC today - check FOBT  HTN:  - pt was hypertensive on admission but now normotensive  - Will monitor off standing meds  - Per PCP note- pt was supposed to be on plavix, norvasc, statin and metoprolol  - Will reassess meds once NG tube dc'd  Asthma: - c/w dulera, nebs prn

## 2014-08-27 NOTE — Progress Notes (Signed)
I have seen and examined the patient and agree with the assessment and plans. Check xray  [name] A. Magnus IvanBlackman  MD, FACS

## 2014-08-28 ENCOUNTER — Encounter (HOSPITAL_COMMUNITY)
Admission: EM | Disposition: A | Discharge status: Home / Self Care | Payer: Medicaid Other | Source: Home / Self Care | Attending: Internal Medicine

## 2014-08-28 ENCOUNTER — Encounter (HOSPITAL_COMMUNITY): Payer: Self-pay | Admitting: *Deleted

## 2014-08-28 DIAGNOSIS — R109 Unspecified abdominal pain: Secondary | ICD-10-CM

## 2014-08-28 DIAGNOSIS — E876 Hypokalemia: Secondary | ICD-10-CM

## 2014-08-28 DIAGNOSIS — J45909 Unspecified asthma, uncomplicated: Secondary | ICD-10-CM

## 2014-08-28 DIAGNOSIS — D649 Anemia, unspecified: Secondary | ICD-10-CM

## 2014-08-28 DIAGNOSIS — K5660 Unspecified intestinal obstruction: Secondary | ICD-10-CM

## 2014-08-28 DIAGNOSIS — I10 Essential (primary) hypertension: Secondary | ICD-10-CM

## 2014-08-28 HISTORY — PX: ESOPHAGOGASTRODUODENOSCOPY: SHX5428

## 2014-08-28 LAB — CBC
HCT: 26.8 % — ABNORMAL LOW (ref 36.0–46.0)
HEMATOCRIT: 23 % — AB (ref 36.0–46.0)
HEMATOCRIT: 25.7 % — AB (ref 36.0–46.0)
HEMATOCRIT: 28.8 % — AB (ref 36.0–46.0)
HEMOGLOBIN: 7.6 g/dL — AB (ref 12.0–15.0)
HEMOGLOBIN: 8 g/dL — AB (ref 12.0–15.0)
HEMOGLOBIN: 8.5 g/dL — AB (ref 12.0–15.0)
Hemoglobin: 9 g/dL — ABNORMAL LOW (ref 12.0–15.0)
MCH: 27.8 pg (ref 26.0–34.0)
MCH: 28.7 pg (ref 26.0–34.0)
MCH: 27.4 pg (ref 26.0–34.0)
MCH: 27.8 pg (ref 26.0–34.0)
MCHC: 31.1 g/dL (ref 30.0–36.0)
MCHC: 33 g/dL (ref 30.0–36.0)
MCHC: 31.3 g/dL (ref 30.0–36.0)
MCHC: 31.7 g/dL (ref 30.0–36.0)
MCV: 89.2 fL (ref 78.0–100.0)
MCV: 86.5 fL (ref 78.0–100.0)
MCV: 88.9 fL (ref 78.0–100.0)
MCV: 86.8 fL (ref 78.0–100.0)
Platelets: 215 10*3/uL (ref 150–400)
Platelets: 403 10*3/uL — ABNORMAL HIGH (ref 150–400)
Platelets: 243 10*3/uL (ref 150–400)
Platelets: 234 10*3/uL (ref 150–400)
RBC: 2.88 MIL/uL — ABNORMAL LOW (ref 3.87–5.11)
RBC: 3.1 MIL/uL — AB (ref 3.87–5.11)
RBC: 3.24 MIL/uL — ABNORMAL LOW (ref 3.87–5.11)
RBC: 2.65 MIL/uL — AB (ref 3.87–5.11)
RDW: 14.5 % (ref 11.5–15.5)
RDW: 14.6 % (ref 11.5–15.5)
RDW: 14.5 % (ref 11.5–15.5)
RDW: 14.5 % (ref 11.5–15.5)
WBC: 8.6 10*3/uL (ref 4.0–10.5)
WBC: 8.2 10*3/uL (ref 4.0–10.5)
WBC: 8.2 10*3/uL (ref 4.0–10.5)
WBC: 8.7 10*3/uL (ref 4.0–10.5)

## 2014-08-28 LAB — BASIC METABOLIC PANEL
ANION GAP: 11 (ref 5–15)
BUN: 12 mg/dL (ref 6–23)
CHLORIDE: 101 meq/L (ref 96–112)
CO2: 26 meq/L (ref 19–32)
CREATININE: 0.94 mg/dL (ref 0.50–1.10)
Calcium: 7.8 mg/dL — ABNORMAL LOW (ref 8.4–10.5)
GFR calc Af Amer: 69 mL/min — ABNORMAL LOW (ref >90)
GFR calc non Af Amer: 60 mL/min — ABNORMAL LOW (ref >90)
Glucose, Bld: 122 mg/dL — ABNORMAL HIGH (ref 70–99)
Potassium: 3.6 mEq/L — ABNORMAL LOW (ref 3.7–5.3)
Sodium: 138 mEq/L (ref 137–147)

## 2014-08-28 SURGERY — EGD (ESOPHAGOGASTRODUODENOSCOPY)
Anesthesia: Moderate Sedation

## 2014-08-28 MED ORDER — BUTAMBEN-TETRACAINE-BENZOCAINE 2-2-14 % EX AERO
INHALATION_SPRAY | CUTANEOUS | Status: DC | PRN
  Administered 2014-08-28: 2 via TOPICAL

## 2014-08-28 MED ORDER — FENTANYL CITRATE 0.05 MG/ML IJ SOLN
INTRAMUSCULAR | Status: DC | PRN
  Administered 2014-08-28: 25 ug via INTRAVENOUS

## 2014-08-28 MED ORDER — SODIUM CHLORIDE 0.9 % IV SOLN
INTRAVENOUS | Status: DC
Start: 2014-08-28 — End: 2014-08-28
  Administered 2014-08-28: 05:00:00 via INTRAVENOUS

## 2014-08-28 MED ORDER — POTASSIUM CHLORIDE 10 MEQ/100ML IV SOLN
10.0000 meq | INTRAVENOUS | Status: AC
  Administered 2014-08-28 (×2): 10 meq via INTRAVENOUS
  Filled 2014-08-28 (×2): qty 100

## 2014-08-28 MED ORDER — ALBUTEROL SULFATE (2.5 MG/3ML) 0.083% IN NEBU
2.5000 mg | INHALATION_SOLUTION | Freq: Four times a day (QID) | RESPIRATORY_TRACT | Status: DC
  Administered 2014-08-28: 2.5 mg via RESPIRATORY_TRACT
  Filled 2014-08-28: qty 3

## 2014-08-28 MED ORDER — MIDAZOLAM HCL 5 MG/ML IJ SOLN
INTRAMUSCULAR | Status: AC
  Filled 2014-08-28: qty 2

## 2014-08-28 MED ORDER — SODIUM CHLORIDE 0.9 % IV SOLN: INTRAVENOUS | Status: DC

## 2014-08-28 MED ORDER — MIDAZOLAM HCL 10 MG/2ML IJ SOLN
INTRAMUSCULAR | Status: DC | PRN
  Administered 2014-08-28: 2 mg via INTRAVENOUS
  Administered 2014-08-28: 1 mg via INTRAVENOUS

## 2014-08-28 MED ORDER — SODIUM CHLORIDE 0.9 % IV SOLN
INTRAVENOUS | Status: DC
  Administered 2014-08-28: 18:00:00 via INTRAVENOUS

## 2014-08-28 MED ORDER — DIPHENHYDRAMINE HCL 50 MG/ML IJ SOLN
INTRAMUSCULAR | Status: AC
  Filled 2014-08-28: qty 1

## 2014-08-28 MED ORDER — FENTANYL CITRATE 0.05 MG/ML IJ SOLN
INTRAMUSCULAR | Status: AC
  Filled 2014-08-28: qty 2

## 2014-08-28 MED ORDER — ALBUTEROL SULFATE (2.5 MG/3ML) 0.083% IN NEBU: 2.5000 mg | INHALATION_SOLUTION | Freq: Four times a day (QID) | RESPIRATORY_TRACT | Status: DC | PRN

## 2014-08-28 NOTE — Progress Notes (Signed)
I have seen and examined the patient and agree with the assessment and plans.  Janet Decesare A. Osman Calzadilla  MD, FACS  

## 2014-08-28 NOTE — Progress Notes (Signed)
EGD done, stomach emptying and duodenum and pylorus patent. There was an area of exudate and erosion in the mid body of the stomach which may represent either some focal true inflammation or NG suction trauma. Will leave NG tube out and try clear liquids

## 2014-08-28 NOTE — Progress Notes (Signed)
Subjective: [name] was interviewed with the assistance of a telephone interpreter.  She was seen by GI last evening who increased her protonix to BID and have scheduled her for an EGD this morning. She says her abdominal pain is much better since she began the protonix. When we discussed a blood transfusion she said she would be okay with receiving blood but would be more comfortable if we spoke to her children beforehand. She did give informed consent for EGD to GI last night and plans to proceed with scope. No other complaints including shortness of breath.  Objective: Vital signs in last 24 hours: Filed Vitals:   08/27/14 1832 08/27/14 2129 08/28/14 0614 08/28/14 1000  BP:  135/61 130/68 172/80  Pulse:  79 70 86  Temp:  98.4 F (36.9 C) 98.1 F (36.7 C)   TempSrc:  Oral Oral   Resp:  17 16 16   Height:      Weight:      SpO2: 96% 93% 94%    Weight change:   Intake/Output Summary (Last 24 hours) at 08/28/14 1015 Last data filed at 08/28/14 0831  Gross per 24 hour  Intake   1626 ml  Output    975 ml  Net    651 ml  275 NG, 0 stool yesterday  Gen: A&O x 4, no acute distress Heart: Regular rate and rhythm, normal S1 S2, no murmurs, rubs, or gallops Lungs: Diffuse wheezes bilaterally, respirations unlabored Abd: RLQ ostomy with very small amount of residual dark stool, no surrounding erythema. Surgical scars. Mild distention, RLQ TTP, no rebound/guarding, +BS Ext: No edema or cyanosis NG:  Minimal dark NG output in container  Lab Results: Basic Metabolic Panel:  Recent Labs Lab 08/23/14 1253  08/27/14 0329 08/28/14 0509  NA  --   < > 146 138  K  --   < > 3.4* 3.6*  CL  --   < > 109 101  CO2  --   < > 27 26  GLUCOSE  --   < > 135* 122*  BUN  --   < > 26* 12  CREATININE  --   < > 0.98 0.94  CALCIUM  --   < > 8.2* 7.8*  MG 1.8  --   --   --   < > = values in this interval not displayed. CBC:  Recent Labs Lab 08/23/14 1240  08/28/14 0059 08/28/14 0509  WBC  16.7*  < > 8.6 8.2  NEUTROABS 15.5*  --   --   --   HGB 12.9  < > 8.0* 7.6*  HCT 41.2  < > 25.7* 23.0*  MCV 86.7  < > 89.2 86.8  PLT 262  < > 215 403*  < > = values in this interval not displayed. Medications: I have reviewed the patient's current medications. Scheduled Meds: . albuterol  2.5 mg Nebulization Q6H  . [MAR HOLD] heparin  5,000 Units Subcutaneous 3 times per day  . [MAR HOLD] mometasone-formoterol  2 puff Inhalation BID  . [MAR HOLD] pantoprazole (PROTONIX) IV  40 mg Intravenous Q12H  . potassium chloride  10 mEq Intravenous Q1 Hr x 4   Continuous Infusions: . sodium chloride 20 mL/hr at 08/28/14 0525  . dextrose 5 % and 0.45% NaCl 75 mL/hr at 08/27/14 2033   PRN Meds:.[MAR HOLD] acetaminophen, [MAR HOLD] acetaminophen, [MAR HOLD] menthol-cetylpyridinium, [MAR HOLD]  morphine injection, [MAR HOLD] phenol  Assessment/Plan:  Small bowel obstruction, likely partial vs  ileus: Patient reports improvement in abdominal pain with protonix. She is still having minimal dark output from her NG likely partial SBO v ileus. Currently being followed by gen surg who would strongly prefer medical management as she has extensive abdominal surgical history. Patient going for am EGD to assess possible GI bleed and will reassess based on findings.  - Appreciate GI, surgery and wound care recommendations. - reassess need for NG following EGD - continue NPO but allow ice per surgery rec - continue IV zofran PRN nausea  - continue Morphine for pain  - continue D5-1/2 NS IV fluids  Anemia: Hemoglobin 7.6 this morning after being 8-8.5 yesterday and ~13 on admission. FOBC positive. Dark NG output concerning for GI bleed. Seen by GI yesterday who plan for EGD this am. We would prefer to transfuse as she has question of history of CAD and hemoglobin<8 but patient says she wants us to speak to her children first and neither son has answered several phone call attempts. Patient type and screened.  Epigastric pain that improved with protonix suggests PUD, gastritis, NG trauma. Previous anemia work-up suggestive of anemia of chronic disease. -appreciate GI -f/u EGD results -continue to attempt consent from sons for blood transfusions -cont to monitor -cont protonix 40 mg IV q12h  Hypokalemia: K 3.6 up from 3.4 yesterday after receiving KCl 40 mEq IV. Likely 2/2 NG suction of KCl -supplement additional KCl 40 mEq IV and reassess  Coronary artery disease (status post and stenting in 2011): The son denies that patient is on any medications for any heart disease and also denies that his mother has a history of heart disease. However, upon chart review, patient has had cardiac catheterization with multivessel disease in 2011. A discharge summary from 11/11/2013 indicates that the patient was discharged with enalapril 5 mg twice a day, metoprolol 12.5 mg twice a day, and simvastatin 20 mg daily. Faxed PCP note from 9/25 states patient is prescribed amlodipine 5 mg po daily, clopidogrel 75 mg po daily, enalapril 5 mg po BID, nitrostat 0.4 mg sl q6hprn, metoprolol 12.5 po BID, and simvastatin 20 mg po daily. Patient has had negative troponin cycles and unchanged EKG. - hold home medications pending current n.p.o. status  - will clarify medication regimen with patient and family  HTN: Hypertensive on admission at 189/72 but has since been low to normotensive. Home BP regimen as documented above. - hold BP meds for now as she is low and npo - continue to monitor vitals - can use IV antihypertensive prn if BPs become elevated  Asthma: Patient wheezing on exam. On advair at home. We encouraged her to ask for albuterol if she has SOB but will schedule this as she is unlikely to ask for it. -dulera 2 puff BID -albuterol q6h  Diet:  NPO with ice VTE: Heparin  Code:  Full  Dispo: Disposition is deferred at this time, awaiting improvement of current medical problems.  Anticipated discharge in  approximately 2 day(s).   The patient does have a current PCP (Regina EmeryMohammad L Garba, MD) and [facility].  The patient does not know have transportation limitations that hinder transportation to clinic appointments.  .Services Needed at time of discharge: Y = Yes, Blank = No PT:   OT:   RN:   Equipment:   Other:     LOS: 5 days   Lorenda HatchetAdam L Ezana Hubbert, MD 08/28/2014, 10:15 AM

## 2014-08-28 NOTE — H&P (View-Only) (Signed)
Referring Provider: Dr. Heide SparkNarendra Primary Care Physician:  Lonia BloodGARBA,LAWAL, MD Primary Gastroenterologist:  Gentry FitzUnassigned  Reason for Consultation:  GI bleed  HPI: [name] M Proffit is a 73 y.o. [place] female seen for a consult due to a GI bleed with black colored fluid from NG tube drainage. Patient in the hospital for abdominal pain and nausea and thought to have a partial SBO. 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 EGD in 10/2013 by Dr. Elnoria HowardHung showed a gastric ulcer with a large clot. Hgb has dropped to 8.0 today from 10.2 on 08/25/14. Having dark green stool in ostomy. Patient complaining of epigastric pain. Patient does not speak [place] and family does not know [place]. History obtained from chart and from speaking to patient through telephone translator.     Past Medical History  Diagnosis Date  . Asthma   . NSTEMI (non-ST elevated myocardial infarction) 2011    medically managed  . Paroxysmal atrial fibrillation     Hx of amiodarone use  . Acute gastric ulcer with bleeding 11/09/2013    Past Surgical History  Procedure Laterality Date  . Colostomy Left 2011    Dr. Carolynne Edouardoth  . Partial colectomy  2011    necrotic bowel after lap chole  . Laparoscopic cholecystectomy  2011  . Ercp  2011  . Cardiac catheterization  2011    100% occlusion RCA, 80% stenosis circumflex, 60% stenosis LAD  . Esophagogastroduodenoscopy N/A 11/08/2013    Procedure: ESOPHAGOGASTRODUODENOSCOPY (EGD);  Surgeon: Theda BelfastPatrick D Hung, MD;  [facility];  Service: Endoscopy;  Laterality: N/A;    Prior to Admission medications   Medication Sig Start Date End Date Taking? Authorizing Provider  albuterol (PROVENTIL HFA;VENTOLIN HFA) 108 (90 BASE) MCG/ACT inhaler Inhale 1 puff into the lungs every 6 (six) hours as needed for wheezing or shortness of breath.   Yes Historical  Provider, MD  Fluticasone-Salmeterol (ADVAIR) 250-50 MCG/DOSE AEPB Inhale 1 puff into the lungs 2 (two) times daily.   Yes Historical Provider, MD    Scheduled Meds: . heparin  5,000 Units Subcutaneous 3 times per day  . mometasone-formoterol  2 puff Inhalation BID  . pantoprazole (PROTONIX) IV  40 mg Intravenous Daily   Continuous Infusions: . dextrose 5 % and 0.45% NaCl 1,000 mL (08/27/14 0613)   PRN Meds:.acetaminophen, acetaminophen, albuterol, menthol-cetylpyridinium, morphine injection, phenol  Allergies as of 08/23/2014  . (No Known Allergies)    History reviewed. No pertinent family history.  History   Social History  . Marital Status: Married    Spouse Name: N/A    Number of Children: N/A  . Years of Education: N/A   Occupational History  . Not on file.   Social History Main Topics  . Smoking status: Never Smoker   . Smokeless tobacco: Not on file  . Alcohol Use: No  . Drug Use: No  . Sexual Activity:    Other Topics Concern  . Not on file   Social History Narrative  . No narrative on file    Review of Systems: All negative from a GI standpoint except as stated above in HPI.  Physical Exam: Vital signs: Filed Vitals:   08/27/14 1330  BP: 137/73  Pulse: 83  Temp: 98.7 F (37.1 C)  Resp: 18   Last BM Date: 08/27/14 General:   Elderly, frail, Alert, Well-developed, well-nourished, no acute distress HEENT:  anicteric; NG noted (black fluid in tubing) Lungs:  Clear throughout to auscultation.   No wheezes, crackles, or rhonchi. No acute distress. Heart:  Regular rate and rhythm; no murmurs, clicks, rubs,  or gallops. Abdomen: epigastric tenderness with guarding, soft, mild distention, ostomy intact with small amount of dark green semi-formed stool, +BS  Rectal:  Deferred Ext: no edema  GI:  Lab Results:  Recent Labs  08/27/14 0329 08/27/14 1254 08/27/14 1843  WBC 10.4 9.7 8.8  HGB 8.3* 8.0* 8.5*  HCT 26.7* 25.2* 27.3*  PLT 235 263 240    BMET  Recent Labs  08/25/14 0311 08/26/14 0320 08/27/14 0329  NA 136* 145 146  K 4.2 3.8 3.4*  CL 106 107 109  CO2 17* 22 27  GLUCOSE 92 73 135*  BUN 40* 41* 26*  CREATININE 1.44* 1.19* 0.98  CALCIUM 8.0* 8.3* 8.2*   LFT No results found for this basename: PROT, ALBUMIN, AST, ALT, ALKPHOS, BILITOT, BILIDIR, IBILI,  in the last 72 hours PT/INR No results found for this basename: LABPROT, INR,  in the last 72 hours   Studies/Results: Dg Abd 2 Views  08/27/2014   CLINICAL DATA:  Abdominal pain, followup small bowel dilatation  EXAM: ABDOMEN - 2 VIEW  COMPARISON:  08/25/2014  FINDINGS: Scattered small bowel gas is noted. No obstructive pattern is seen. A nasogastric catheter remains in the distal stomach. No free air is noted. No acute bony abnormality is seen.  IMPRESSION: Nasogastric catheter within the stomach.  No obstructive process is identified.   Electronically Signed   By: Alcide CleverMark  Lukens M.D.   On: 08/27/2014 11:27    Impression/Plan: 73 yo with partial SBO with black NG drainage and falling Hgb concerning for an upper GI bleed. Peptic ulcer vs. NG trauma vs. Gastritis. EGD needed in AM to further evaluate and consent obtained with use of phone translator and nurse verified. Change to IV PPI Q 12 hours. NPO. Dr. Madilyn FiremanHayes to do EGD tomorrow, time to be determined.    LOS: 4 days   Jyssica Rief C.  08/27/2014, 7:45 PM

## 2014-08-28 NOTE — Interval H&P Note (Signed)
History and Physical Interval Note:  08/28/2014 11:45 AM  [name]  has presented today for surgery, with the diagnosis of hematemesis  The various methods of treatment have been discussed with the patient and family. After consideration of risks, benefits and other options for treatment, the patient has consented to  Procedure(s): ESOPHAGOGASTRODUODENOSCOPY (EGD) (N/A) as a surgical intervention .  The patient's history has been reviewed, patient examined, no change in status, stable for surgery.  I have reviewed the patient's chart and labs.  Questions were answered to the patient's satisfaction.     [name]

## 2014-08-28 NOTE — Progress Notes (Addendum)
  Subjective: Patient back from endo.  No c/o.  No nausea with NGT out or clamped  Objective: Vital signs in last 24 hours: Temp:  [98.1 F (36.7 C)-98.7 F (37.1 C)] 98.1 F (36.7 C) (10/20 [id number]) Pulse Rate:  [70-83] 70 (10/20 0614) Resp:  [16-19] 16 (10/20 0614) BP: (103-137)/(48-73) 130/68 mmHg (10/20 0614) SpO2:  [93 %-99 %] 94 % (10/20 0614) Last BM Date: 08/28/14 275 from NG but it's looking like blood now with a drop in hemoglobin.   Nothing recorded from the ostomy. Intake/Output from previous day: 10/19 0701 - 10/20 0700 In: 2051 [I.V.:1651; IV Piggyback:400] Out: 1100 [Urine:825; Emesis/NG output:275] Intake/Output this shift: Total I/O In: -  Out: 100 [Urine:100]  GI: soft, non-tender; bowel sounds normal, stoma is pink and viable.  Good air output and some liquid stool output in bag.  Lab Results:   Recent Labs  08/28/14 0059 08/28/14 0509  WBC 8.6 8.2  HGB 8.0* 7.6*  HCT 25.7* 23.0*  PLT 215 403*    BMET  Recent Labs  08/27/14 0329 08/28/14 0509  NA 146 138  K 3.4* 3.6*  CL 109 101  CO2 27 26  GLUCOSE 135* 122*  BUN 26* 12  CREATININE 0.98 0.94  CALCIUM 8.2* 7.8*   PT/INR No results found for this basename: LABPROT, INR,  in the last 72 hours   Recent Labs Lab 08/23/14 1240 08/24/14 0730  AST 32 117*  ALT 32 100*  ALKPHOS 133* 150*  BILITOT 0.6 1.7*  PROT 8.3 6.6  ALBUMIN 3.8 2.9*     Lipase     Component Value Date/Time   LIPASE 51 08/23/2014 1240     Studies/Results: Dg Abd 2 Views  08/27/2014   CLINICAL DATA:  Abdominal pain, followup small bowel dilatation  EXAM: ABDOMEN - 2 VIEW  COMPARISON:  08/25/2014  FINDINGS: Scattered small bowel gas is noted. No obstructive pattern is seen. A nasogastric catheter remains in the distal stomach. No free air is noted. No acute bony abnormality is seen.  IMPRESSION: Nasogastric catheter within the stomach.  No obstructive process is identified.   Electronically Signed   By: Alcide CleverMark   Lukens M.D.   On: 08/27/2014 11:27    Medications: . heparin  5,000 Units Subcutaneous 3 times per day  . mometasone-formoterol  2 puff Inhalation BID  . 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 AGree with removal of NGT and try clear liquids.  Patient does not appear to be obstructed at this time.  She did have some dark output from her NGT and ostomy apparently overnight.  GI did an endoscopy on her this morning and found some inflammation and shallow ulcer in the stomach, but no definite source. 2. Will continue to follow.  Suspect her diet can be advanced as tolerates tomorrow if she tolerates clears well today.   LOS: 5 days    Merritt Kibby E 2:02 PM 08/28/2014 Pager: [phone]

## 2014-08-28 NOTE — Op Note (Signed)
[name] [address] [place], [id number]   ENDOSCOPY PROCEDURE REPORT  PATIENT: [name], [name]  MR#: [id number] BIRTHDATE: [birth year] , 73  yrs. old GENDER: female ENDOSCOPIST:Emslee Lopezmartinez Madilyn FiremanHayes, MD REFERRED BY: PROCEDURE DATE:  08/28/2014 PROCEDURE: ASA CLASS:    3 INDICATIONS: suspected upper GI bleed MEDICATION: Versed 3, fentanyl 25 mcg TOPICAL ANESTHETIC:   Cetacaine spray  DESCRIPTION OF PROCEDURE:   After the risks and benefits of the procedure were explained, informed consent was obtained.  The Pentax Gastroscope [id number]  endoscope was introduced through the mouth  and advanced to the second portion of the duodenum .  The instrument was slowly withdrawn as the mucosa was fully examined.    esophagus: Normal  Stomach superficial erosions with exudate with some central erythema in the mid body of the stomach. This could represent true inflammation or possibly prolonged nasogastric tube trauma. No suspicion of malignancy and no strong stigmata of hemorrhage. No blood seen anywhere in the stomach and no other lesions. The pylorus was normal. The scope was then withdrawn from the patient and the procedure completed.  COMPLICATIONS: There were no immediate complications.  ENDOSCOPIC IMPRESSION: Possible gastric erosion versus NG trauma, no other source of active bleeding, gastric or duodenal outlet obstruction RECOMMENDATIONS: leave NG tube out. Try clear liquid diet. Continue proton pump inhibitor.   _______________________________ Rosalie DoctoreSignedDorena Cookey:  Ediel Unangst, MD 08/28/2014 12:14 PM     cc:  CPT CODES: ICD CODES:  The ICD and CPT codes recommended by this software are interpretations from the data that the clinical staff has captured with the software.  The verification of the translation of this report to the ICD and CPT codes and modifiers is the sole responsibility of the health care institution and practicing physician where this  report was generated.  [employer] will not be held responsible for the validity of the ICD and CPT codes included on this report.  AMA assumes no liability for data contained or not contained herein. CPT is a Publishing rights managerregistered trademark of the [employer].

## 2014-08-28 NOTE — Progress Notes (Signed)
Pt seen and examined with Dr. Valentino Saxonothman. Please refer to resident note for details.   Pt interviewed with help of telephone interpreter. Epigastric pain has improved on PPI. No new complaints  Exam:  Gen: AAO*3, NAD  Cardio: RRR, normal heart sounds  Lungs: diffuse b/l wheezes +  Abd: soft, non tender, BS +, Ostomy with stool  Ext: no edema   Assessment and Plan:  73 y/o female with likely partial SBO v/s ileus   SBO:  - Surgery following - no surgical intervention at this time  - NG tube removed today - Will monitor on clear liquid diet for now   Abd pain: - Pt s/p EGD with gastric erosion seen. No active sign of hemorrhage - c/w clears - c/w protonix  Anemia:  - Likely secondary to GI bleed - s/p EGD with no active bleed noted  - c/w PPI  -  Would transfuse for Hg <8 but pt deferring to son at this time and unable to contact him - Will hold off on transfusion till we speak to son   HTN:  - Would resume norvasc, metoprolol in AM - Hold plavix for now given anemia  Asthma:  - c/w dulera, nebs prn

## 2014-08-29 ENCOUNTER — Encounter (HOSPITAL_COMMUNITY): Payer: Self-pay | Admitting: Gastroenterology

## 2014-08-29 DIAGNOSIS — D62 Acute posthemorrhagic anemia: Secondary | ICD-10-CM

## 2014-08-29 LAB — CBC
HEMATOCRIT: 25.8 % — AB (ref 36.0–46.0)
HEMATOCRIT: 31.6 % — AB (ref 36.0–46.0)
Hemoglobin: 8.2 g/dL — ABNORMAL LOW (ref 12.0–15.0)
Hemoglobin: 10.2 g/dL — ABNORMAL LOW (ref 12.0–15.0)
MCH: 28.1 pg (ref 26.0–34.0)
MCH: 27.2 pg (ref 26.0–34.0)
MCHC: 31.8 g/dL (ref 30.0–36.0)
MCHC: 32.3 g/dL (ref 30.0–36.0)
MCV: 88.4 fL (ref 78.0–100.0)
MCV: 84.3 fL (ref 78.0–100.0)
Platelets: 238 10*3/uL (ref 150–400)
Platelets: 216 10*3/uL (ref 150–400)
RBC: 2.92 MIL/uL — ABNORMAL LOW (ref 3.87–5.11)
RBC: 3.75 MIL/uL — ABNORMAL LOW (ref 3.87–5.11)
RDW: 14.9 % (ref 11.5–15.5)
RDW: 14.7 % (ref 11.5–15.5)
WBC: 7.7 10*3/uL (ref 4.0–10.5)
WBC: 7.9 10*3/uL (ref 4.0–10.5)

## 2014-08-29 LAB — BASIC METABOLIC PANEL
Anion gap: 11 (ref 5–15)
BUN: 9 mg/dL (ref 6–23)
CALCIUM: 8.1 mg/dL — AB (ref 8.4–10.5)
CO2: 25 mEq/L (ref 19–32)
Chloride: 103 mEq/L (ref 96–112)
Creatinine, Ser: 1.09 mg/dL (ref 0.50–1.10)
GFR calc Af Amer: 58 mL/min — ABNORMAL LOW (ref >90)
GFR, EST NON AFRICAN AMERICAN: 50 mL/min — AB (ref >90)
Glucose, Bld: 102 mg/dL — ABNORMAL HIGH (ref 70–99)
Potassium: 4.1 mEq/L (ref 3.7–5.3)
Sodium: 139 mEq/L (ref 137–147)

## 2014-08-29 LAB — PREPARE RBC (CROSSMATCH)

## 2014-08-29 MED ORDER — PANTOPRAZOLE SODIUM 40 MG PO TBEC
40.0000 mg | DELAYED_RELEASE_TABLET | Freq: Two times a day (BID) | ORAL | Status: DC
  Administered 2014-08-29 – 2014-08-30 (×2): 40 mg via ORAL
  Filled 2014-08-29 (×2): qty 1

## 2014-08-29 MED ORDER — SODIUM CHLORIDE 0.9 % IV SOLN
Freq: Once | INTRAVENOUS | Status: AC
  Administered 2014-08-29: 17:00:00 via INTRAVENOUS

## 2014-08-29 MED ORDER — ALBUTEROL SULFATE (2.5 MG/3ML) 0.083% IN NEBU
2.5000 mg | INHALATION_SOLUTION | Freq: Four times a day (QID) | RESPIRATORY_TRACT | Status: DC
  Administered 2014-08-29 (×2): 2.5 mg via RESPIRATORY_TRACT
  Filled 2014-08-29 (×2): qty 3

## 2014-08-29 MED ORDER — ALBUTEROL SULFATE (2.5 MG/3ML) 0.083% IN NEBU
2.5000 mg | INHALATION_SOLUTION | Freq: Two times a day (BID) | RESPIRATORY_TRACT | Status: DC
  Filled 2014-08-29: qty 3

## 2014-08-29 NOTE — Progress Notes (Signed)
Subjective: [name] was interviewed with the assistance of a telephone interpreter.  She tolerated EGD yesterday well with no complications. It demonstrated an area of exudate and erosion in the mid body of stomach which may be true inflammation v NG trauma. NG has remained out since procedure. GI entered the room during my exam. She says she feels much better. Her family brought her rice soup last night and she ate it and tolerated well. She has had no abdominal discomfort since her EGD. She has no complaints and was asking about discharge. She did not discuss tranfusion with her family but still would want their permission first if possible.   Objective: Vital signs in last 24 hours: Filed Vitals:   08/28/14 1402 08/28/14 2038 08/28/14 2101 08/29/14 0459  BP: 135/67  122/63 132/61  Pulse: 87  95 80  Temp: 97.6 F (36.4 C)  100 F (37.8 C) 99 F (37.2 C)  TempSrc: Oral  Oral Oral  Resp: 20  16 16   Height:      Weight:      SpO2: 96% 96% 96% 98%   Weight change:   Intake/Output Summary (Last 24 hours) at 08/29/14 1059 Last data filed at 08/29/14 0618  Gross per 24 hour  Intake    412 ml  Output    350 ml  Net     62 ml    Gen: A&O x 4, no acute distress HEENT: dry mucous membranes, PERRL Heart: Regular rate and rhythm, normal S1 S2, no murmurs, rubs, or gallops Lungs: Diffuse wheezes bilaterally, respirations unlabored Abd: RLQ ostomy with very small amount of residual dark stool, no surrounding erythema. Surgical scars. Mild distention, no tenderness to palpation, no rebound/guarding, +BS Ext: No edema or cyanosis   Lab Results: Basic Metabolic Panel:  Recent Labs Lab 08/23/14 1253  08/28/14 0509 08/29/14 0300  NA  --   < > 138 139  K  --   < > 3.6* 4.1  CL  --   < > 101 103  CO2  --   < > 26 25  GLUCOSE  --   < > 122* 102*  BUN  --   < > 12 9  CREATININE  --   < > 0.94 1.09  CALCIUM  --   < > 7.8* 8.1*  MG 1.8  --   --   --   < > = values in this  interval not displayed. CBC:  Recent Labs Lab 08/23/14 1240  08/28/14 2120 08/29/14 0300  WBC 16.7*  < > 8.7 7.7  NEUTROABS 15.5*  --   --   --   HGB 12.9  < > 8.5* 8.2*  HCT 41.2  < > 26.8* 25.8*  MCV 86.7  < > 86.5 88.4  PLT 262  < > 234 238  < > = values in this interval not displayed. Medications: I have reviewed the patient's current medications. Scheduled Meds: . albuterol  2.5 mg Nebulization Q6H  . heparin  5,000 Units Subcutaneous 3 times per day  . mometasone-formoterol  2 puff Inhalation BID  . pantoprazole  40 mg Oral BID   Continuous Infusions: . sodium chloride 20 mL/hr at 08/28/14 1735   PRN Meds:.acetaminophen, acetaminophen, menthol-cetylpyridinium, morphine injection, phenol  Assessment/Plan:  Small bowel obstruction, likely partial vs ileus: Appears to have resolved. She reports no abdominal pain since EGD while on protonix BID. NG tube out since EGD which showed no evidence of obstruction and only  exudates with erosion in the mid body of stomach which may be true inflammation v NG trauma. Patient was on clear liquid diet but family brought rice soup which was tolerated well. Will advance diet slowly beginning with full liquid for lunch - Appreciate GI, surgery and wound care recommendations. -ADAT beginning with full liquid - continue IV zofran PRN nausea  - continue Morphine for pain  - continue NS IVF 20 mL/hr as dry  Anemia: Hemoglobin 8.2 this morning after being 7.6-9 yesterday and ~13 on admission. FOBC positive. Dark NG output was concerning for GI bleed but EGD did not show active bleed. We would prefer to transfuse as she has question of history of CAD and hemoglobin<8 but patient says she wants us to speak to her children first and neither son has answered several phone call attempts. Patient type and screened 10/19. Epigastric pain that improved with protonix and EGD findings suggest gastritis v NG trauma. Previous anemia work-up suggestive of anemia  of chronic disease. -appreciate GI -continue to attempt consent from sons for blood transfusions -cont to monitor -change protonix 40 mg IV q12h to protonix 40 mg po bid  Hypokalemia: Resolved, K 4.1. Likely 2/2 NG suction of gastric fluid -cont to monitor  Coronary artery disease (status post and stenting in 2011): The son denies that patient is on any medications for any heart disease and also denies that his mother has a history of heart disease. However, upon chart review, patient has had cardiac catheterization with multivessel disease in 2011. A discharge summary from 11/11/2013 indicates that the patient was discharged with enalapril 5 mg twice a day, metoprolol 12.5 mg twice a day, and simvastatin 20 mg daily. Faxed PCP note from 9/25 states patient is prescribed amlodipine 5 mg po daily, clopidogrel 75 mg po daily, enalapril 5 mg po BID, nitrostat 0.4 mg sl q6hprn, metoprolol 12.5 po BID, and simvastatin 20 mg po daily. Patient has had negative troponin cycles and unchanged EKG. - hold home medications as normotensive and concern for possible bleed in setting of anemia. - will clarify medication regimen with patient and family  HTN: Hypertensive on admission at 189/72 but has since been low to normotensive. Home BP regimen as documented above. - hold BP meds for now as she is normotensive without - continue to monitor vitals - can use IV antihypertensive prn if BPs become elevated  Asthma: Patient wheezing on exam. On advair at home. We have scheduled her albuterol as she is unlikely to ask for it. -cont dulera 2 puff BID -albuterol q6h  Diet:  Full liquid VTE: Heparin  Code:  Full  Dispo: Disposition is deferred at this time, awaiting improvement of current medical problems.  Anticipated discharge in approximately 2 day(s).   The patient does have a current PCP (Rometta EmeryMohammad L Garba, MD) and [facility] discharge.  The patient does not  know have transportation limitations that hinder transportation to clinic appointments.  .Services Needed at time of discharge: Y = Yes, Blank = No PT:   OT:   RN:   Equipment:   Other:     LOS: 6 days   Lorenda HatchetAdam L Tannie Koskela, MD 08/29/2014, 10:59 AM

## 2014-08-29 NOTE — Progress Notes (Signed)
I have seen and examined the patient and agree with the assessment and plans.  Eudora Guevarra A. Sharlene Mccluskey  MD, FACS  

## 2014-08-29 NOTE — Clinical Documentation Improvement (Signed)
08/28/14 prog note.Marland Kitchen.Marland Kitchen."Anemia: Hemoglobin 7.6 this morning after being 8-8.5 yesterday and ~13 on admission. FOBC positive. Dark NG output concerning for GI bleed.Marland Kitchen.Marland Kitchen."Patient type and screened. Epigastric pain that improved with protonix suggests PUD, gastritis, NG trauma. Previous anemia work-up suggestive of anemia of chronic disease.".Marland Kitchen.Marland Kitchen. [place] per note 08/28/14..."-f/u EGD results -continue to attempt consent from sons for blood transfusions -cont to monitor -cont protonix 40 mg IV q12h"... 08/28/14 GI MD note..."EGD done,"..." There was an area of exudate and erosion in the mid body of the stomach which may represent either some focal true inflammation or NG suction trauma. Will leave NG tube out and try clear liquids"...  For accurate Dx specificity & severity can noted "Anemia" be further specified / linked to abnormal labs findings as noted per progress note. Thank you  . Documentation of Anemia should include the type of anemia: --Nutritional --Hemolytic --Aplastic --Due to blood loss (please specify > acute, chronic blood loss) --Other (please specify) . Include in documentation if Anemia is due to nutrition or mineral deficits, resulting in a nutritional anemia . Link any laboratory findings to a related diagnosis (if appropriate) . Document any associated diagnoses/conditions  Supporting Information: see above note   Thank You, Toribio Harbourphelia R Aime Meloche, RN, BSN, CCDS Certified Clinical Documentation Specialist Pager: [phone] [place]: Health Information Management

## 2014-08-29 NOTE — Progress Notes (Signed)
Pt seen and examined with Dr. Valentino Saxonothman. Please refer to resident note for details.   Pt feels better today. Tolerating PO. Abd pain improved. No new complaints  Exam:  Gen: AAO*3, NAD  Cardio: RRR, normal heart sounds  Lungs: diffuse b/l wheezes +  Abd: soft, non tender, BS + Ext: no edema   Assessment and Plan:  73 y/o female with likely partial SBO v/s ileus   SBO:  - Surgery following -  Ok for d/c home once tolerating regular diet - c/w full liquid diet for now - Will advance to regular diet in AM if tolerating PO liquids  Abd pain:  - Pt s/p EGD with gastric erosion seen. No active sign of hemorrhage  - c/w advancing diet  - c/w protonix   Anemia likely secondary to acute blood loss:  - Likely secondary to GI bleed  - s/p EGD with + erosion but no active bleed - Hg stable now.   - c/w PPI  - Would transfuse for Hg <8. Will attempt to speak to son to discuss transfusion possibility at patient request. Pt does need a transfusion at this time though  HTN:  - Consider resuming ACE-I on d/c. Would hold off on beta blocker given asthma with persistent wheezing - Hold plavix for now given anemia   Asthma:  - c/w dulera, nebs prn  Likely d/c in AM if tolerating PO

## 2014-08-29 NOTE — Progress Notes (Signed)
Patient ID: Genelle BalGanga M Mikus, female   DOB: 1941-08-17, 73 y.o.   MRN: [id number] 1 Day Post-Op  Subjective: Pt feels well today.  No nausea.  Tolerating clear liquids  Objective: Vital signs in last 24 hours: Temp:  [97.6 F (36.4 C)-100 F (37.8 C)] 99 F (37.2 C) (10/21 0459) Pulse Rate:  [80-105] 80 (10/21 0459) Resp:  [15-28] 16 (10/21 0459) BP: (108-186)/(45-115) 132/61 mmHg (10/21 0459) SpO2:  [95 %-100 %] 98 % (10/21 0459) Last BM Date: 08/28/14  Intake/Output from previous day: 10/20 0701 - 10/21 0700 In: 412 [I.V.:412] Out: 450 [Urine:450] Intake/Output this shift:    PE: Abd: soft, Nt, ND, +BS, ostomy with liquids and air output  Lab Results:   Recent Labs  08/28/14 2120 08/29/14 0300  WBC 8.7 7.7  HGB 8.5* 8.2*  HCT 26.8* 25.8*  PLT 234 238   BMET  Recent Labs  08/28/14 0509 08/29/14 0300  NA 138 139  K 3.6* 4.1  CL 101 103  CO2 26 25  GLUCOSE 122* 102*  BUN 12 9  CREATININE 0.94 1.09  CALCIUM 7.8* 8.1*   PT/INR No results found for this basename: LABPROT, INR,  in the last 72 hours CMP     Component Value Date/Time   NA 139 08/29/2014 0300   K 4.1 08/29/2014 0300   CL 103 08/29/2014 0300   CO2 25 08/29/2014 0300   GLUCOSE 102* 08/29/2014 0300   BUN 9 08/29/2014 0300   CREATININE 1.09 08/29/2014 0300   CALCIUM 8.1* 08/29/2014 0300   PROT 6.6 08/24/2014 0730   ALBUMIN 2.9* 08/24/2014 0730   AST 117* 08/24/2014 0730   ALT 100* 08/24/2014 0730   ALKPHOS 150* 08/24/2014 0730   BILITOT 1.7* 08/24/2014 0730   GFRNONAA 50* 08/29/2014 0300   GFRAA 58* 08/29/2014 0300   Lipase     Component Value Date/Time   LIPASE 51 08/23/2014 1240       Studies/Results: Dg Abd 2 Views  08/27/2014   CLINICAL DATA:  Abdominal pain, followup small bowel dilatation  EXAM: ABDOMEN - 2 VIEW  COMPARISON:  08/25/2014  FINDINGS: Scattered small bowel gas is noted. No obstructive pattern is seen. A nasogastric catheter remains in the distal stomach. No  free air is noted. No acute bony abnormality is seen.  IMPRESSION: Nasogastric catheter within the stomach.  No obstructive process is identified.   Electronically Signed   By: Alcide CleverMark  Lukens M.D.   On: 08/27/2014 11:27    Anti-infectives: Anti-infectives   None       Assessment/Plan  1. SBO, resolved 2. Small gastric ulcer 3. Anemia  Plan: 1. Ok for full liquids today.  Her diet can be advanced as tolerates from our standpoint.  When she is able to tolerate a soft diet she is stable for dc home from our standpoint.   LOS: 6 days    Amena Dockham E 08/29/2014, 9:01 AM Pager: [phone]

## 2014-08-29 NOTE — Progress Notes (Signed)
[facility] Progress Note  Subjective: Interpreter available patient tolerated some rice soup yesterday no nausea minimal abdominal pain yesterday, none today  Objective: Vital signs in last 24 hours: Temp:  [97.6 F (36.4 C)-100 F (37.8 C)] 99 F (37.2 C) (10/21 0459) Pulse Rate:  [80-105] 80 (10/21 0459) Resp:  [15-28] 16 (10/21 0459) BP: (108-186)/(45-115) 132/61 mmHg (10/21 0459) SpO2:  [95 %-100 %] 98 % (10/21 0459) Weight change:    PE: Abdomen soft  Lab Results: Results for orders placed during the hospital encounter of 08/23/14 (from the past 24 hour(s))  CBC     Status: Abnormal   Collection Time    08/28/14  2:27 PM      Result Value Ref Range   WBC 8.2  4.0 - 10.5 K/uL   RBC 3.24 (*) 3.87 - 5.11 MIL/uL   Hemoglobin 9.0 (*) 12.0 - 15.0 g/dL   HCT 16.128.8 (*) 09.636.0 - 04.546.0 %   MCV 88.9  78.0 - 100.0 fL   MCH 27.8  26.0 - 34.0 pg   MCHC 31.3  30.0 - 36.0 g/dL   RDW 40.914.5  81.111.5 - 91.415.5 %   Platelets 243  150 - 400 K/uL  CBC     Status: Abnormal   Collection Time    08/28/14  9:20 PM      Result Value Ref Range   WBC 8.7  4.0 - 10.5 K/uL   RBC 3.10 (*) 3.87 - 5.11 MIL/uL   Hemoglobin 8.5 (*) 12.0 - 15.0 g/dL   HCT 78.226.8 (*) 95.636.0 - 21.346.0 %   MCV 86.5  78.0 - 100.0 fL   MCH 27.4  26.0 - 34.0 pg   MCHC 31.7  30.0 - 36.0 g/dL   RDW 08.614.5  57.811.5 - 46.915.5 %   Platelets 234  150 - 400 K/uL  CBC     Status: Abnormal   Collection Time    08/29/14  3:00 AM      Result Value Ref Range   WBC 7.7  4.0 - 10.5 K/uL   RBC 2.92 (*) 3.87 - 5.11 MIL/uL   Hemoglobin 8.2 (*) 12.0 - 15.0 g/dL   HCT 62.925.8 (*) 52.836.0 - 41.346.0 %   MCV 88.4  78.0 - 100.0 fL   MCH 28.1  26.0 - 34.0 pg   MCHC 31.8  30.0 - 36.0 g/dL   RDW 24.414.9  01.011.5 - 27.215.5 %   Platelets 238  150 - 400 K/uL  BASIC METABOLIC PANEL     Status: Abnormal   Collection Time    08/29/14  3:00 AM      Result Value Ref Range   Sodium 139  137 - 147 mEq/L   Potassium 4.1  3.7 - 5.3 mEq/L   Chloride 103  96 - 112 mEq/L   CO2 25  19 - 32 mEq/L   Glucose, Bld 102 (*) 70 - 99 mg/dL   BUN 9  6 - 23 mg/dL   Creatinine, Ser 5.361.09  0.50 - 1.10 mg/dL   Calcium 8.1 (*) 8.4 - 10.5 mg/dL   GFR calc non Af Amer 50 (*) >90 mL/min   GFR calc Af Amer 58 (*) >90 mL/min   Anion gap 11  5 - 15    Studies/Results: Dg Abd 2 Views  08/27/2014   CLINICAL DATA:  Abdominal pain, followup small bowel dilatation  EXAM: ABDOMEN - 2 VIEW  COMPARISON:  08/25/2014  FINDINGS: Scattered small bowel gas is noted. No  obstructive pattern is seen. A nasogastric catheter remains in the distal stomach. No free air is noted. No acute bony abnormality is seen.  IMPRESSION: Nasogastric catheter within the stomach.  No obstructive process is identified.   Electronically Signed   By: Alcide CleverMark  Lukens M.D.   On: 08/27/2014 11:27      Assessment: sMall bowel obstruction, clinically resolving No likely source of upper GI bleeding and no retained blood or food in the stomach at time of endoscopy with one shallow mid-gastric body erosion versus scope trauma  Plan: Continue to advance diet slowly, continue Protonix pump inhibitor. We'll followup in 2 days    Kallan Merrick C 08/29/2014, 8:02 AM

## 2014-08-30 LAB — TYPE AND SCREEN
ABO/RH(D): O POS
Antibody Screen: NEGATIVE
Unit division: 0

## 2014-08-30 LAB — CBC
HCT: 28.7 % — ABNORMAL LOW (ref 36.0–46.0)
Hemoglobin: 9.5 g/dL — ABNORMAL LOW (ref 12.0–15.0)
MCH: 27.7 pg (ref 26.0–34.0)
MCHC: 33.1 g/dL (ref 30.0–36.0)
MCV: 83.7 fL (ref 78.0–100.0)
PLATELETS: 211 10*3/uL (ref 150–400)
RBC: 3.43 MIL/uL — ABNORMAL LOW (ref 3.87–5.11)
RDW: 15.2 % (ref 11.5–15.5)
WBC: 6.7 10*3/uL (ref 4.0–10.5)

## 2014-08-30 LAB — BASIC METABOLIC PANEL
Anion gap: 9 (ref 5–15)
BUN: 6 mg/dL (ref 6–23)
CALCIUM: 8.5 mg/dL (ref 8.4–10.5)
CO2: 27 meq/L (ref 19–32)
CREATININE: 1.13 mg/dL — AB (ref 0.50–1.10)
Chloride: 105 mEq/L (ref 96–112)
GFR calc Af Amer: 55 mL/min — ABNORMAL LOW (ref >90)
GFR calc non Af Amer: 48 mL/min — ABNORMAL LOW (ref >90)
Glucose, Bld: 116 mg/dL — ABNORMAL HIGH (ref 70–99)
Potassium: 4.2 mEq/L (ref 3.7–5.3)
Sodium: 141 mEq/L (ref 137–147)

## 2014-08-30 LAB — CULTURE, BLOOD (ROUTINE X 2)
Culture: NO GROWTH
Culture: NO GROWTH

## 2014-08-30 MED ORDER — PANTOPRAZOLE SODIUM 40 MG PO TBEC: 40.0000 mg | DELAYED_RELEASE_TABLET | Freq: Two times a day (BID) | ORAL | Status: DC

## 2014-08-30 NOTE — Progress Notes (Signed)
Pt seen and examined with Dr. Valentino Saxonothman. Please refer to resident note for details.  Pt feels well today. Abd pain resolved and tolerating PO  Exam:  Gen: AAO*3, NAD  Cardio: RRR, normal heart sounds  Lungs: Mild wheezes on ant auscultation b/l but CTAB posteriorly Abd: soft, non tender, BS +  Ext: no edema   Assessment and Plan:  73 y/o female with likely partial SBO v/s ileus   SBO:  - Surgery following - Ok for d/c home once tolerating regular diet  - Diet advanced to soft today  - No further w/u for now   Abd pain:  - Now resolved- likely secondary to gastric erosion - c/w advancing diet  - c/w protonix   Anemia likely secondary to acute blood loss:  - Likely secondary to GI bleed  - s/p EGD with + erosion but no active bleed  - c/w PPI  - s/p 1 unit PRBC yesterday with appropriate increase in Hg - No further w/u for now  HTN:  - Resume ACE-I on d/c. Would hold off on beta blocker given asthma with recurrent wheezing  - Hold plavix for now given anemia. Consider resume as outpatient if CBC remains stable  Asthma:  - c/w dulera, nebs prn   Pt stable for d/c home today if tolerating soft diet. Outpatient f/u with PMD next week

## 2014-08-30 NOTE — Progress Notes (Signed)
I have seen and examined the patient and agree with the assessment and plans.  Morell Mears A. Norval Slaven  MD, FACS  

## 2014-08-30 NOTE — Discharge Summary (Signed)
Name: [name] MRN: [id number] DOB: May 21, 1941 73 y.o. PCP: Rometta Emery, MD  Date of Admission: 08/23/2014 11:27 AM Date of Discharge: 08/30/2014 Attending Physician: No att. providers found  Discharge Diagnosis:  Principal Problem:   Partial small bowel obstruction Active Problems:   Essential hypertension, benign   Atrial fibrillation   Asthma   Ileostomy status  Discharge Medications:   Medication List         albuterol 108 (90 BASE) MCG/ACT inhaler  Commonly known as:  PROVENTIL HFA;VENTOLIN HFA  Inhale 1 puff into the lungs every 6 (six) hours as needed for wheezing or shortness of breath.     Fluticasone-Salmeterol 250-50 MCG/DOSE Aepb  Commonly known as:  ADVAIR  Inhale 1 puff into the lungs 2 (two) times daily.     pantoprazole 40 MG tablet  Commonly known as:  PROTONIX  Take 1 tablet (40 mg total) by mouth 2 (two) times daily.        Disposition and follow-up:   [name] was discharged from [facility] in Stable condition.  At the hospital follow up visit please address:  1.  Abdominal pain, anemia, medication reconciliation  2.  Labs / imaging needed at time of follow-up: CBC (reassess hemoglobin), anemia panel  3.  Pending labs/ test needing follow-up: none  Follow-up Appointments: Follow-up Information   Follow up On 09/03/2014. (@1  :45pm)       Follow up with Lonia Blood, MD.   Specialty:  Internal Medicine   Contact information:   [address] [place] [address] [phone]       Discharge Instructions: Discharge Instructions   Diet - low sodium heart healthy    Complete by:  As directed      Increase activity slowly    Complete by:  As directed            Consultations: Treatment Team:  Barrie Folk, MD Shirley Friar, MD  Procedures Performed:  Ct Abdomen Pelvis W Contrast  08/23/2014   CLINICAL DATA:  Severe diffuse abdominal pain. History of partial colectomy.  Initial encounter.  EXAM: CT ABDOMEN AND PELVIS WITH CONTRAST  TECHNIQUE: Multidetector CT imaging of the abdomen and pelvis was performed using the standard protocol following bolus administration of intravenous contrast.  CONTRAST:  80mL OMNIPAQUE IOHEXOL 300 MG/ML  SOLN  COMPARISON:  11/04/2013; 05/22/2011  FINDINGS: Normal hepatic contour. There is a minimal amount of focal fatty infiltration adjacent to the fissure for ligamentum teres. Post cholecystectomy. There is unchanged mild dilatation of the common bile duct measuring 1.4 cm in maximal coronal dimension (coronal image 69, series 206), presumably the sequela of post cholecystectomy state. No intrahepatic biliary duct dilatation. No ascites.  There is symmetric enhancement and excretion of the bilateral kidneys. No definite renal stones on this postcontrast examination. Subcentimeter hypoattenuating lesion within the superior pole the left kidney is too small to adequately characterize of favored to represent a renal cyst. No urinary obstruction or perinephric stranding. Normal appearance of the bilateral adrenal glands, pancreas and spleen. Incidental note is made of a punctate splenule.  The patient has undergone right-sided hemicolectomy with placement of an end ileostomy within the left lower abdominal quadrant. Re- demonstrated is a short segment peristomal hernia containing the adjacent distal aspect of the small bowel, now with trace amount of free fluid within the caudal aspect of the peristomal hernia (coronal image 18, series 206). The distal small bowel is dilated  to the level of the ostomy with potential transition point at its very distal aspect (coronal image 13, series 206). Multiple air-fluid levels are seen within distal loops of small bowel. No pneumoperitoneum, pneumatosis or portal venous  There is a minimal amount of free fluid within the right lower abdominal quadrant (image 56, series 201). No peripherally enhancing  definable/drainable fluid collections. Enteric tube tip terminates within the gastric antrum.  Scattered mixed calcified and noncalcified atherosclerotic plaque within a normal caliber abdominal aorta. The major branch vessels of the abdominal aorta appear widely patent on this non CTA examination. No retroperitoneal, mesenteric, pelvic or inguinal lymphadenopathy.  Normal appearance of the pelvic organs for age. No discrete adnexal lesions. Normal appearance of the urinary bladder. No free fluid in the pelvic cul-de-sac.  Limited visualization of the lower thorax demonstrates an unchanged approximately 4 mm nodule within the subpleural aspect of the right middle lobe (image 2, series 205), grossly unchanged since the 05/2011 examination. No pleural effusion. No focal airspace opacities.  No acute or aggressive osseous abnormalities. Unchanged mild (under 25%) compression deformity involving the superior endplate of the L4 vertebral body. Remaining vertebral body heights are preserved.  Note is made of an approximately 3.7 x 1.1 cm hypo attenuating (approximately 4 Hounsfield unit) fluid collection within the subcutaneous tissues to the right lateral aspect of the umbilicus (image 55, series 201).  IMPRESSION: 1. Post right-sided hemicolectomy with end ileostomy within the left lower abdominal quadrant. The distal small bowel is dilated to the level of the ostomy with a potential transition point at the level of the ostomy. As a definitive etiology of this apparent transition point is not depicted, it is presumably secondary to adhesions. 2. 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 3. Trace amount of free fluid within the right lower abdomen without peripheral enhancement to suggest definable/drainable fluid collection. 4. Approximately 4 mm nodule within the subpleural aspect the right middle lobe is unchanged since the 2012 examination and thus of benign etiology.    Electronically Signed   By: Simonne Come M.D.   On: 08/23/2014 23:59   Dg Abd 2 Views  08/27/2014   CLINICAL DATA:  Abdominal pain, followup small bowel dilatation  EXAM: ABDOMEN - 2 VIEW  COMPARISON:  08/25/2014  FINDINGS: Scattered small bowel gas is noted. No obstructive pattern is seen. A nasogastric catheter remains in the distal stomach. No free air is noted. No acute bony abnormality is seen.  IMPRESSION: Nasogastric catheter within the stomach.  No obstructive process is identified.   Electronically Signed   By: Alcide Clever M.D.   On: 08/27/2014 11:27   Dg Abd 2 Views  08/25/2014   CLINICAL DATA:  Abdominal pain.  Small-bowel obstruction.  EXAM: ABDOMEN - 2 VIEW  COMPARISON:  CT abdomen and pelvis 08/23/2014. Plain films of the abdomen 08/24/2014.  FINDINGS: NG tube remains in place with the tip in the distal stomach. No free intraperitoneal air is identified. The abdomen is nearly gasless. Two gas-filled but nondilated loops of small bowel are identified. Cholecystectomy clips are noted.  IMPRESSION: NG tube in good position.  Nearly gasless abdomen without evidence of obstruction.   Electronically Signed   By: Drusilla Kanner M.D.   On: 08/25/2014 14:09   Dg Abd 2 Views  08/24/2014   CLINICAL DATA:  Abdominal pain and vomiting.  History of colostomy  EXAM: ABDOMEN - 2 VIEW  COMPARISON:  08/23/2014  FINDINGS: Stable NG tube. Dilated small  bowel loops have markedly improved. There is no free intraperitoneal gas.  IMPRESSION: Improved small bowel distension. NG tube is stable in the antrum of the stomach.   Electronically Signed   By: Maryclare BeanArt  Hoss M.D.   On: 08/24/2014 08:45   Dg Abd Acute W/chest  08/23/2014   CLINICAL DATA:  Abdominal pain, nausea and vomiting beginning this morning.  EXAM: ACUTE ABDOMEN SERIES (ABDOMEN 2 VIEW & CHEST 1 VIEW)  COMPARISON:  Single view of the chest and two views of the abdomen 11/08/2013.  FINDINGS: Single view of the chest demonstrates clear lungs and normal  heart size. No pneumothorax or pleural effusion.  Two views of the abdomen show no free intraperitoneal air. There are gas-filled and dilated loops of small bowel measuring up to 3.5 cm in diameter with air-fluid levels present.  IMPRESSION: Findings compatible with small-bowel obstruction.   Electronically Signed   By: Drusilla Kannerhomas  Dalessio M.D.   On: 08/23/2014 13:46   Dg Abd Portable 1v  08/23/2014   CLINICAL DATA:  Status post gastric tube placement.  EXAM: PORTABLE ABDOMEN - 1 VIEW  COMPARISON:  Contemporaneously plain film of the abdomen 08/23/2014.  FINDINGS: Interval placement of gastric tube which projects over the lower mediastinum. The tube appears to terminate in the fundus of the stomach. Side port is beyond the gastroesophageal junction. The stomach appears relatively decompressed.  Persisting dilated loops of small bowel in the lower abdomen, partially obscured by inner luminal fluid on this supine view.  No evidence of free air on this single view.  Surgical changes of prior cholecystectomy.  Unremarkable appearance of the bony elements without displaced fracture.  IMPRESSION: Interval placement of gastric tube, which terminates along the greater curvature of stomach in the fundus. The side port is beyond the GE junction.  Persisting dilated loops of small bowel, compatible with small bowel obstruction.  Surgical changes of prior cholecystectomy.  Signed,  Yvone NeuJaime S. Loreta AveWagner, DO  [facility]  [facility]   Electronically Signed   By: Gilmer MorJaime  Wagner D.O.   On: 08/23/2014 21:36   EKG: sinus rhythm, ST elevation in inferior leads, seen on serial EKGs since at least December 2014  EGD (08/28/14): Possible gastric erosion versus NG trauma, no other source of active bleeding, gastric or duodenal outlet obstruction  Admission HPI: Regina. Erasmo Jenkins is a 73 year old female with a history of asthma (CAD with NSTEMI on prior records but patient's son denies on  interview) and partial colectomy with ileostomy in 2011 (ERCP with duodenal perforation and subsequent right colon necrosis), who presented to the ED with diffuse abdominal pain. History was obtained from the son. The patient rates the pain at a level of 8-9/10 that started this morning. The son reports that the patient's ostomy had no output yesterday and but had output today that was similar to her normal level and consistency. Patient has reported of nausea but no vomiting. She was able to eat yesterday and keep food down before the onset of her symptoms. She had chills but denies fevers. According to her son, the current presentation is similar to her previous episode of partial SBO last year in December. She denies dyspnea, chest pain, dysuria, hematuria, or hematochezia.   In the ED, patient received 4 mg of morphine and had an NG tube inserted. Patient's symptoms were mildly improved.    Hospital Course by problem list: Principal Problem:   Partial small bowel obstruction Active Problems:   Essential hypertension,  benign   Atrial fibrillation   Asthma   Ileostomy status   #Small bowel obstruction, likely partial vs ileus: [name] was followed by general surgery who felt she likely had a partial bowel obstruction. CT demonstrated SBO with distal small bowel dilated to the level of the ostomy but no clear transition point. This was likely secondary to adhesions from extensive abdominal surgical history. They felt strongly that they should avoid surgery given her extensive abdominal surgical history and she had an NG tube placed. Serial abdominal film improved daily. Surgery performed digital interrogation of the and wound care irrigation yielded stool and brown beans which may have been obstructing. Patient reported symptomatic improvement following disimpaction. Because of the dark appearance of her NG output and declining hemoglobin (see below) with positive fecal occult blood card from ostomy  output, GI was consulted. They performed an EGD which showed no evidence of obstruction and only exudates with erosion in the mid body of stomach which may be true inflammation v NG trauma. Patient tolerating full liquid diet. Surgery cleared her for discharge if she tolerates soft diet which was started this am. Her NG was removed during EGD and her diet was advanced as tolerated to soft with no complications. She reports complete resolution of symptoms since her EGD and beginning protonix 40 mg bid.  #Anemia 2/2 resolved GI bleed: [name]'s hemoglobin trended down from 12.9 on admission to a nadir of 7.6. She had previous anemia panel suggestive of anemia of chronic disease but because of rapid but asymptomatic decline and dark appearance of NG output, we got a fecal occult blood card which was positive. We consulted GI who perforemd EGD as noted above. Although they did not see any active bleeding, it is possible she had a bleed that has since resolved on its own. After receiving permission from the patient and her son, she received 1 U PRBC without complication. Post transfusion hemoglobin 10.2 was down to 9.5 the morning of discharge. She should have cbc on close follow-up with anemia panel. We continued her protonix 40 mg po bid on discharge but held her clopidogrel given risk of active bleeding. This can be reassessed by PCP.  #Hypokalemia: [name] had a potassium as low as 3.4 which improved to 4.2 with supplementation. This was likely secondary to NG suction of gastric fluid   #Coronary artery disease (status post and stenting in 2011): [name]'s son denies that patient is on any medications for any heart disease and also denies that his mother has a history of heart disease. However, upon chart review, patient has had cardiac catheterization with multivessel disease in 2011. A discharge summary from 11/11/2013 indicates that the patient was discharged with enalapril 5 mg twice a day, metoprolol  12.5 mg twice a day, and simvastatin 20 mg daily. Faxed PCP note from 9/25 states patient is prescribed amlodipine 5 mg po daily, clopidogrel 75 mg po daily, enalapril 5 mg po BID, nitrostat 0.4 mg sl q6hprn, metoprolol 12.5 po BID, and simvastatin 20 mg po daily. [name] would complain about her "heart" hurting but point to her epigastric region. She had unchanged EKG and negative cycled troponins. This was likely a communication misunderstanding. She was followed by cardiology because of this and her baseline EKG changes.  #HTN: Mr Katrinka Jenkins hypertensive on admission at 189/72 but has since been low to normotensive. We received a faxed copy of her last PCP note (9/25) which included prescriptions for amlodipine 5 mg po daily,  clopidogrel 75 mg po daily, enalapril 5 mg po BID, nitrostat 0.4 mg sl q6hprn, metoprolol 12.5 po BID, and simvastatin 20 mg po daily. As she was low to normotensive during her stay, we held home BP medications. This should be reassessed by PCP who may resume if needed. We suggest consider holding beta blocker given wheezing throughout hospitalization   #Asthma: [name] was generally wheezy on lung exam during her stay. We continued her home advair as dulera (hospital formulary) and scheduled her albuterol as she did not ask for it when originally prn.    Discharge Vitals:   BP 134/69  Pulse 88  Temp(Src) 98.6 F (37 C) (Oral)  Resp 18  Ht 4\' 4"  (1.321 m)  Wt 104 lb 4.4 oz (47.3 kg)  BMI 27.11 kg/m2  SpO2 94%  Discharge Physical Exam: Gen: A&O x 4, no acute distress  HEENT: MMM, PERRL  Heart: Regular rate and rhythm, normal S1 S2, no murmurs, rubs, or gallops  Lungs: diffuse wheezes appreciated when listening to heart on anterior chest but lungs CTAB on back, respirations unlabored  Abd: RLQ ostomy with pink stoma and very small amount of residual green stool, no surrounding erythema. Surgical scars. No distention, no tenderness to palpation, no rebound/guarding, +BS    Ext: No edema or cyanosis  Discharge Labs:  Basic Metabolic Panel:  Recent Labs Lab 08/29/14 0300 08/30/14 0622  NA 139 141  K 4.1 4.2  CL 103 105  CO2 25 27  GLUCOSE 102* 116*  BUN 9 6  CREATININE 1.09 1.13*  CALCIUM 8.1* 8.5   Liver Function Tests:  Recent Labs Lab 08/24/14 0730  AST 117*  ALT 100*  ALKPHOS 150*  BILITOT 1.7*  PROT 6.6  ALBUMIN 2.9*  CBC:  Recent Labs Lab 08/29/14 2027 08/30/14 0622  WBC 7.9 6.7  HGB 10.2* 9.5*  HCT 31.6* 28.7*  MCV 84.3 83.7  PLT 216 211   Cardiac Enzymes:  Recent Labs Lab 08/26/14 1545 08/26/14 2034 08/27/14 0329  TROPONINI <0.30 <0.30 <0.30   Urinalysis:  Recent Labs Lab 08/24/14 2016  COLORURINE AMBER*  LABSPEC 1.040*  PHURINE 5.0  GLUCOSEU NEGATIVE  HGBUR NEGATIVE  BILIRUBINUR SMALL*  KETONESUR 15*  PROTEINUR NEGATIVE  UROBILINOGEN 0.2  NITRITE NEGATIVE  LEUKOCYTESUR TRACE*   Misc. Labs: Lactic acid 1.45 Lipase 51    Signed: Lorenda HatchetAdam L Dnaiel Voller, MD 08/30/2014, 2:57 PM    Services Ordered on Discharge: none Equipment Ordered on Discharge: none

## 2014-08-30 NOTE — Progress Notes (Signed)
LOS: 7 days   Subjective: Pt feels well today. No nausea. Tolerating clear liquids. Plan to advance to soft diet today.    Objective: Vital signs in last 24 hours: Temp:  [98.4 F (36.9 C)-98.9 F (37.2 C)] 98.6 F (37 C) (10/22 0512) Pulse Rate:  [79-90] 79 (10/22 0512) Resp:  [16-19] 17 (10/22 0512) BP: (106-138)/(46-100) 138/69 mmHg (10/22 0512) SpO2:  [92 %-99 %] 93 % (10/22 0512) Last BM Date: 08/29/14   Laboratory  CBC  Recent Labs  08/29/14 2027 08/30/14 0622  WBC 7.9 6.7  HGB 10.2* 9.5*  HCT 31.6* 28.7*  PLT 216 211   BMET  Recent Labs  08/29/14 0300 08/30/14 0622  NA 139 141  K 4.1 4.2  CL 103 105  CO2 25 27  GLUCOSE 102* 116*  BUN 9 6  CREATININE 1.09 1.13*  CALCIUM 8.1* 8.5     Physical Exam General appearance: alert, cooperative and no distress GI: RLQ ostomy with pink stoma and very small amount of residual green stool, no surrounding erythema. Surgical scars. No distention, no tenderness to palpation, no rebound/guarding, +BS   Assessment/Plan: 1. SBO, resolved  2. Small gastric ulcer  3. Anemia  When she is able to tolerate a soft diet she is stable for discharge home from surgical standpoint.     Ardis RowanPRESSON, Kristofer Schaffert LEE, PA [facility] Pager [phone] 08/30/2014

## 2014-08-30 NOTE — Discharge Instructions (Signed)
It was a pleasure to take care of you during your hospitalization. Please seek medical attention or return to the ED if you have new or worsening abdominal pain, nausea or vomiting, or lightheadedness/dizziness.

## 2014-08-30 NOTE — Progress Notes (Signed)
Subjective: [name] was interviewed with the assistance of a telephone interpreter. I spoke to her son [name] yesterday who gave permission for blood transfusion. She received 1 U PRBC without complication. She is tolerating her diet well which was full liquid yesterday. She reports that she is back at her baseline.  She thinks that she is completely better with no abdominal pain and would like to be discharged. She was very thankful for the care she has received. Surgery saw her yesterday and cleared her for discharge if she tolerates soft diet. Objective: Vital signs in last 24 hours: Filed Vitals:   08/29/14 1938 08/29/14 1953 08/29/14 2118 08/30/14 0512  BP: 135/71  106/46 138/69  Pulse: 87  90 79  Temp: 98.9 F (37.2 C)  98.6 F (37 C) 98.6 F (37 C)  TempSrc: Oral  Oral Oral  Resp: 18  17 17   Height:      Weight:      SpO2: 98% 97% 92% 93%   Weight change:   Intake/Output Summary (Last 24 hours) at 08/30/14 0924 Last data filed at 08/30/14 0500  Gross per 24 hour  Intake    889 ml  Output      0 ml  Net    889 ml    Gen: A&O x 4, no acute distress HEENT: MMM, PERRL Heart: Regular rate and rhythm, normal S1 S2, no murmurs, rubs, or gallops Lungs: diffuse wheezes appreciated when listening to heart on anterior chest but lungs CTAB on back, respirations unlabored Abd: RLQ ostomy with pink stoma and very small amount of residual green stool, no surrounding erythema. Surgical scars. No distention, no tenderness to palpation, no rebound/guarding, +BS Ext: No edema or cyanosis   Lab Results: Basic Metabolic Panel:  Recent Labs Lab 08/23/14 1253  08/29/14 0300 08/30/14 0622  NA  --   < > 139 141  K  --   < > 4.1 4.2  CL  --   < > 103 105  CO2  --   < > 25 27  GLUCOSE  --   < > 102* 116*  BUN  --   < > 9 6  CREATININE  --   < > 1.09 1.13*  CALCIUM  --   < > 8.1* 8.5  MG 1.8  --   --   --   < > = values in this interval not displayed. CBC:  Recent Labs Lab  08/23/14 1240  08/29/14 2027 08/30/14 0622  WBC 16.7*  < > 7.9 6.7  NEUTROABS 15.5*  --   --   --   HGB 12.9  < > 10.2* 9.5*  HCT 41.2  < > 31.6* 28.7*  MCV 86.7  < > 84.3 83.7  PLT 262  < > 216 211  < > = values in this interval not displayed. Medications: I have reviewed the patient's current medications. Scheduled Meds: . albuterol  2.5 mg Nebulization BID  . heparin  5,000 Units Subcutaneous 3 times per day  . mometasone-formoterol  2 puff Inhalation BID  . pantoprazole  40 mg Oral BID   Continuous Infusions:   PRN Meds:.acetaminophen, acetaminophen, menthol-cetylpyridinium, morphine injection, phenol  Assessment/Plan:  Small bowel obstruction, likely partial vs ileus: Resolved. She reports no abdominal pain since EGD while on protonix BID. NG tube out since EGD which showed no evidence of obstruction and only exudates with erosion in the mid body of stomach which may be true inflammation v NG trauma.  Patient tolerating full liquid diet. Surgery cleared her for discharge if she tolerates soft diet which was started this am.  - Appreciate GI, surgery and wound care recommendations. - assess tolerance of soft diet - continue Morphine for pain  - d/c IVF  Anemia 2/2 resolved GI bleed: I received permission from her son [name] for transfusion yesterday and she received 1 U PRBC without complication. Post transfusion hemoglobin 10.2 is down to 9.5 this morning. Patient asymptomatic. EGD with no active bleed but possible she had a previous GI bleed that resolved as [facility] and had dark output from NG tube. Previous anemia work-up suggestive of anemia of chronic disease. She should have cbc on close follow-up with anemia panel. -appreciate GI -cont to monitor -cont protonix 40 mg po bid  Hypokalemia: Resolved, K 4.2. Likely 2/2 NG suction of gastric fluid -cont to monitor  Coronary artery disease (status post and stenting in 2011): The son denies that patient is on any  medications for any heart disease and also denies that his mother has a history of heart disease. However, upon chart review, patient has had cardiac catheterization with multivessel disease in 2011. A discharge summary from 11/11/2013 indicates that the patient was discharged with enalapril 5 mg twice a day, metoprolol 12.5 mg twice a day, and simvastatin 20 mg daily. Faxed PCP note from 9/25 states patient is prescribed amlodipine 5 mg po daily, clopidogrel 75 mg po daily, enalapril 5 mg po BID, nitrostat 0.4 mg sl q6hprn, metoprolol 12.5 po BID, and simvastatin 20 mg po daily. Patient has had negative troponin cycles and unchanged EKG. - hold home medications as normotensive and concern for possible bleed in setting of anemia. - PCP may resume medications on close follow-up  HTN: Hypertensive on admission at 189/72 but has since been low to normotensive. Overnight she was 100s-130s/40s-70s. Home BP regimen as documented above. - hold BP meds as she is normotensive without; PCP may resume after reassessment. We suggest consider holding beta blocker given wheezing throughout hospitalization - continue to monitor vitals - can use IV antihypertensive prn if BPs become elevated  Asthma: Patient wheezing on anterior chest but lungs CTAB on back exam. On advair at home. We have scheduled her albuterol as she is unlikely to ask for it. -cont dulera 2 puff BID -albuterol q6h  Diet:  soft VTE: Heparin  Code:  Full  Dispo: Disposition is deferred at this time, awaiting improvement of current medical problems.  Anticipated discharge in approximately 0-1 day(s).   The patient does have a current PCP (Regina EmeryMohammad L Garba, MD) and [facility] after discharge.  The patient does not know have transportation limitations that hinder transportation to clinic appointments.  .Services Needed at time of discharge: Y = Yes, Blank = No PT:   OT:   RN:   Equipment:   Other:      LOS: 7 days   Lorenda HatchetAdam L Chloeanne Poteet, MD 08/30/2014, 9:24 AM

## 2014-08-31 NOTE — Discharge Summary (Signed)
INTERNAL MEDICINE ATTENDING DISCHARGE COSIGN   I evaluated the patient on the day of discharge and discussed the discharge plan with my resident team. I agree with the discharge documentation and disposition.   Nikolina Simerson 08/31/2014, 1:51 PM

## 2014-08-31 NOTE — Progress Notes (Signed)
Discharge instructions and prescription explained to pt and family via interpreter.  All questions answered.  IV removed. Pt discharged to home with family in no s/s of distress. Vanice Sarahhompson, Lorimer Tiberio L

## 2015-11-28 ENCOUNTER — Other Ambulatory Visit: Payer: Self-pay

## 2015-11-28 ENCOUNTER — Observation Stay (HOSPITAL_COMMUNITY)
Admission: EM | Admit: 2015-11-28 | Discharge: 2015-11-30 | Disposition: A | Discharge status: Home / Self Care | Payer: Medicaid Other | Attending: Internal Medicine | Admitting: Internal Medicine

## 2015-11-28 ENCOUNTER — Emergency Department (HOSPITAL_COMMUNITY): Payer: Medicaid Other

## 2015-11-28 ENCOUNTER — Encounter (HOSPITAL_COMMUNITY): Payer: Self-pay | Admitting: *Deleted

## 2015-11-28 DIAGNOSIS — E785 Hyperlipidemia, unspecified: Secondary | ICD-10-CM | POA: Insufficient documentation

## 2015-11-28 DIAGNOSIS — Z7951 Long term (current) use of inhaled steroids: Secondary | ICD-10-CM | POA: Insufficient documentation

## 2015-11-28 DIAGNOSIS — N179 Acute kidney failure, unspecified: Principal | ICD-10-CM | POA: Insufficient documentation

## 2015-11-28 DIAGNOSIS — I480 Paroxysmal atrial fibrillation: Secondary | ICD-10-CM | POA: Insufficient documentation

## 2015-11-28 DIAGNOSIS — R748 Abnormal levels of other serum enzymes: Secondary | ICD-10-CM | POA: Diagnosis not present

## 2015-11-28 DIAGNOSIS — Z79899 Other long term (current) drug therapy: Secondary | ICD-10-CM | POA: Insufficient documentation

## 2015-11-28 DIAGNOSIS — J45909 Unspecified asthma, uncomplicated: Secondary | ICD-10-CM | POA: Insufficient documentation

## 2015-11-28 DIAGNOSIS — I252 Old myocardial infarction: Secondary | ICD-10-CM | POA: Insufficient documentation

## 2015-11-28 DIAGNOSIS — I10 Essential (primary) hypertension: Secondary | ICD-10-CM | POA: Diagnosis not present

## 2015-11-28 DIAGNOSIS — A084 Viral intestinal infection, unspecified: Secondary | ICD-10-CM | POA: Insufficient documentation

## 2015-11-28 DIAGNOSIS — Z9049 Acquired absence of other specified parts of digestive tract: Secondary | ICD-10-CM | POA: Insufficient documentation

## 2015-11-28 DIAGNOSIS — E875 Hyperkalemia: Secondary | ICD-10-CM | POA: Diagnosis not present

## 2015-11-28 DIAGNOSIS — R1013 Epigastric pain: Secondary | ICD-10-CM

## 2015-11-28 DIAGNOSIS — I7100 Dissection of unspecified site of aorta: Secondary | ICD-10-CM

## 2015-11-28 DIAGNOSIS — Z932 Ileostomy status: Secondary | ICD-10-CM

## 2015-11-28 DIAGNOSIS — I2510 Atherosclerotic heart disease of native coronary artery without angina pectoris: Secondary | ICD-10-CM | POA: Diagnosis not present

## 2015-11-28 DIAGNOSIS — K529 Noninfective gastroenteritis and colitis, unspecified: Secondary | ICD-10-CM | POA: Diagnosis present

## 2015-11-28 DIAGNOSIS — Z933 Colostomy status: Secondary | ICD-10-CM | POA: Diagnosis not present

## 2015-11-28 LAB — BASIC METABOLIC PANEL WITH GFR
Anion gap: 9 (ref 5–15)
BUN: 20 mg/dL (ref 6–20)
CO2: 16 mmol/L — ABNORMAL LOW (ref 22–32)
Calcium: 9 mg/dL (ref 8.9–10.3)
Chloride: 107 mmol/L (ref 101–111)
Creatinine, Ser: 1.54 mg/dL — ABNORMAL HIGH (ref 0.44–1.00)
GFR calc Af Amer: 37 mL/min — ABNORMAL LOW
GFR calc non Af Amer: 32 mL/min — ABNORMAL LOW
Glucose, Bld: 116 mg/dL — ABNORMAL HIGH (ref 65–99)
Potassium: 5.4 mmol/L — ABNORMAL HIGH (ref 3.5–5.1)
Sodium: 132 mmol/L — ABNORMAL LOW (ref 135–145)

## 2015-11-28 LAB — HEPATIC FUNCTION PANEL
ALT: 26 U/L (ref 14–54)
AST: 40 U/L (ref 15–41)
Albumin: 3.8 g/dL (ref 3.5–5.0)
Alkaline Phosphatase: 174 U/L — ABNORMAL HIGH (ref 38–126)
Bilirubin, Direct: 0.2 mg/dL (ref 0.1–0.5)
Indirect Bilirubin: 0.6 mg/dL (ref 0.3–0.9)
Total Bilirubin: 0.8 mg/dL (ref 0.3–1.2)
Total Protein: 7.9 g/dL (ref 6.5–8.1)

## 2015-11-28 LAB — CBC
HEMATOCRIT: 38.9 % (ref 36.0–46.0)
HEMOGLOBIN: 12.5 g/dL (ref 12.0–15.0)
MCH: 27.4 pg (ref 26.0–34.0)
MCHC: 32.1 g/dL (ref 30.0–36.0)
MCV: 85.1 fL (ref 78.0–100.0)
Platelets: 217 10*3/uL (ref 150–400)
RBC: 4.57 MIL/uL (ref 3.87–5.11)
RDW: 13.1 % (ref 11.5–15.5)
WBC: 9.9 10*3/uL (ref 4.0–10.5)

## 2015-11-28 LAB — URINALYSIS, ROUTINE W REFLEX MICROSCOPIC
Bilirubin Urine: NEGATIVE
Glucose, UA: NEGATIVE mg/dL
Ketones, ur: NEGATIVE mg/dL
Leukocytes, UA: NEGATIVE
Nitrite: NEGATIVE
Protein, ur: NEGATIVE mg/dL
Specific Gravity, Urine: 1.017 (ref 1.005–1.030)
pH: 5.5 (ref 5.0–8.0)

## 2015-11-28 LAB — URINE MICROSCOPIC-ADD ON

## 2015-11-28 LAB — I-STAT TROPONIN, ED: Troponin i, poc: 0 ng/mL (ref 0.00–0.08)

## 2015-11-28 LAB — LIPASE, BLOOD: Lipase: 45 U/L (ref 11–51)

## 2015-11-28 MED ORDER — IOHEXOL 350 MG/ML SOLN
80.0000 mL | Freq: Once | INTRAVENOUS | Status: AC | PRN
  Administered 2015-11-28: 80 mL via INTRAVENOUS

## 2015-11-28 MED ORDER — SODIUM CHLORIDE 0.9 % IV SOLN
1.0000 g | Freq: Once | INTRAVENOUS | Status: AC
  Administered 2015-11-28: 1 g via INTRAVENOUS
  Filled 2015-11-28: qty 10

## 2015-11-28 MED ORDER — SODIUM CHLORIDE 0.9 % IV BOLUS (SEPSIS)
1000.0000 mL | Freq: Once | INTRAVENOUS | Status: AC
  Administered 2015-11-28: 1000 mL via INTRAVENOUS

## 2015-11-28 MED ORDER — INSULIN ASPART 100 UNIT/ML IV SOLN
5.0000 [IU] | Freq: Once | INTRAVENOUS | Status: AC
  Administered 2015-11-28: 5 [IU] via INTRAVENOUS
  Filled 2015-11-28: qty 1

## 2015-11-28 MED ORDER — ASPIRIN 81 MG PO CHEW
324.0000 mg | CHEWABLE_TABLET | Freq: Once | ORAL | Status: AC
  Administered 2015-11-28: 324 mg via ORAL

## 2015-11-28 MED ORDER — ASPIRIN 81 MG PO CHEW
CHEWABLE_TABLET | ORAL | Status: AC
  Filled 2015-11-28: qty 4

## 2015-11-28 MED ORDER — DEXTROSE 50 % IV SOLN
1.0000 | Freq: Once | INTRAVENOUS | Status: AC
  Administered 2015-11-28: 50 mL via INTRAVENOUS
  Filled 2015-11-28: qty 50

## 2015-11-28 MED ORDER — MORPHINE SULFATE (PF) 4 MG/ML IV SOLN
4.0000 mg | Freq: Once | INTRAVENOUS | Status: AC
  Administered 2015-11-28: 4 mg via INTRAVENOUS
  Filled 2015-11-28: qty 1

## 2015-11-28 NOTE — ED Notes (Signed)
Pt c/o abd pain that radiates to chest. denies hx of same.

## 2015-11-28 NOTE — Consult Note (Signed)
Reason for Consult: ST elevation on ECG Primary Cardiologist: seen [place] previously none since 2011 Referring Physician: Dr. Dorina Hoyer [name] is an 75 y.o. female.  HPI: [name] is a 75 yo woman from [place] hospitalized in October 2015 for small bowel obstruction, prior history of asthma, NSTEMI August 18-21 with LHC with CTO RCA, 60% LAD, 80% LCx and EF at that time of 45-50% EF treated medically. Cardiology consulted to review her ECG. Today, per chart review and discussion with ER physician, [name] presented today with epigastric pain since 9 AM today. She localizes her pain to her abdomen. She speaks/appears to understand some English and denied chest pain and suggested her abdominal pain was better with medications. Her troponin in the ER this evening was 0.00. ECG revealed ST elevation diffusely with PR elevation in aVR and PR depression diffusely consistent with pericarditis on the ECG. She is being admitted to the hospitalist service for further management.       Past Medical History  Diagnosis Date  . Asthma   . NSTEMI (non-ST elevated myocardial infarction) ([place]) 2011    medically managed  . Paroxysmal atrial fibrillation ([facility])     Hx of amiodarone use  . Acute gastric ulcer with bleeding 11/09/2013    Past Surgical History  Procedure Laterality Date  . Colostomy Left 2011    Dr. Marlou Starks  . Partial colectomy  2011    necrotic bowel after lap chole  . Laparoscopic cholecystectomy  2011  . Ercp  2011  . Cardiac catheterization  2011    100% occlusion RCA, 80% stenosis circumflex, 60% stenosis LAD  . Esophagogastroduodenoscopy N/A 11/08/2013    Procedure: ESOPHAGOGASTRODUODENOSCOPY (EGD);  Surgeon: Beryle Beams, MD;  Location: [facility] ENDOSCOPY;  Service: Endoscopy;  Laterality: N/A;  . Esophagogastroduodenoscopy N/A 08/28/2014    Procedure: ESOPHAGOGASTRODUODENOSCOPY (EGD);  Surgeon: Missy Sabins, MD;  Location[facility] END[facility]COPY;  Service: Endoscopy;  Laterality: N/A;     No family history on file.  Social History:  reports that she has never smoked. She does not have any smokeless tobacco history on file. She reports that she does not drink alcohol or use illicit drugs.  Allergies: No Known Allergies  Medications: I have reviewed the patient's current medications. Prior to Admission:  (Not in a hospital admission) Scheduled:  Results for orders placed or performed during the hospital encounter of 11/28/15 (from the past 48 hour(s))  CBC     Status: None   Collection Time: 11/28/15  8:40 PM  Result Value Ref Range   WBC 9.9 4.0 - 10.5 K/uL   RBC 4.57 3.87 - 5.11 MIL/uL   Hemoglobin 12.5 12.0 - 15.0 g/dL   HCT 38.9 36.0 - 46.0 %   MCV 85.1 78.0 - 100.0 fL   MCH 27.4 26.0 - 34.0 pg   MCHC 32.1 30.0 - 36.0 g/dL   RDW 13.1 11.5 - 15.5 %   Platelets 217 150 - 400 K/uL  I-stat troponin, ED (not at [facility], [facility])     Status: None   Collection Time: 11/28/15  8:44 PM  Result Value Ref Range   Troponin i, poc 0.00 0.00 - 0.08 ng/mL   Comment 3            Comment: Due to the release kinetics of cTnI, a negative result within the first hours of the onset of symptoms does not rule out myocardial infarction with certainty. If myocardial infarction is still suspected, repeat the test at  appropriate intervals.     No results found.  Review of Systems  Cardiovascular: Negative for chest pain.  Gastrointestinal: Positive for abdominal pain.  remainder of 12 point ROS attempted to be performed but barriers met 2/2 language Blood pressure 184/68, pulse 56, temperature 97.5 F (36.4 C), resp. rate 28, SpO2 100 %. Physical Exam  Nursing note and vitals reviewed. Constitutional: She is oriented to person, place, and time. She appears well-developed and well-nourished. No distress.  HENT:  Head: Normocephalic and atraumatic.  Nose: Nose normal.  Mouth/Throat: Oropharynx is clear and moist. No oropharyngeal exudate.  Eyes: Conjunctivae and EOM are  normal. Pupils are equal, round, and reactive to light. No scleral icterus.  Neck: Normal range of motion. Neck supple. No JVD present. No tracheal deviation present.  Cardiovascular: Normal rate, regular rhythm and normal heart sounds.  Exam reveals no gallop and no friction rub.   No murmur heard. Respiratory: Effort normal and breath sounds normal. No respiratory distress. She has no wheezes. She has no rales.  GI: Soft. Bowel sounds are normal. She exhibits no distension. There is no tenderness. There is no rebound.  Mildly uncomfortable  Musculoskeletal: Normal range of motion. She exhibits no edema or tenderness.  Neurological: She is alert and oriented to person, place, and time. No cranial nerve deficit. Coordination normal.  Skin: Skin is warm and dry. No rash noted. She is not diaphoretic. No erythema.  Psychiatric: She has a normal mood and affect. Her behavior is normal. Thought content normal.   Labs reviewed; K 4.2, Cr 1.1, Trop 0.00, wbc 9.9, h/h 12.5/38.9, plt 217 EKG reviewed; SR, PACs, pericarditis with diffuse ST elevation, PR depression and PR elevation in aVR Assessment/Plan: [name] is a 75 yo woman from [place] hospitalized in October 2015 for small bowel obstruction with CT suggestion of prior hemicolectomy, prior history of asthma, NSTEMI August 18-21 with LHC with CTO RCA, 60% LAD, 80% LCx and EF at that time of 45-50% EF treated medically who presented with epigastric discomfort that appears largely resolved with morphine in the ER. Her ECG is consistent with pericarditis but I cannot confirm that she has positional chest discomfort and she is comfortable on my exam. For now, I favor aspirin therapy 81 mg daily. We will continue to explore her symptoms and if she develops more of a pericarditis clinical scenario then more aggressive treatment with colchicine and/or NSAIDS can be pursued. Would obtain echocardiogram in AM, trend troponins and watch on telemetry.  Problem  List Epigastric discomfort Known CAD Pericarditis ECG with ST elevation Dyslipidemia  Plan 1. Watch on telemetry, trend cardiac biomarkers 2. Aspirin 81 mg daily for now 3. Echocardiogram in AM 4. Lipid panel, BNP, hba1c, tsh 5. If pericarditis symptoms/positional chest pain develops then transition to treatment with colchicine +/- NSAIDs    Lior Cartelli 11/28/2015, 9:07 PM

## 2015-11-28 NOTE — ED Notes (Signed)
Pt's son would like to be called if Pt is discharged or needs anything. [phone].

## 2015-11-28 NOTE — ED Notes (Signed)
Patient transported to CT SCAN . 

## 2015-11-29 ENCOUNTER — Encounter (HOSPITAL_COMMUNITY): Payer: Self-pay | Admitting: Family Medicine

## 2015-11-29 DIAGNOSIS — R1013 Epigastric pain: Secondary | ICD-10-CM | POA: Insufficient documentation

## 2015-11-29 DIAGNOSIS — N179 Acute kidney failure, unspecified: Secondary | ICD-10-CM | POA: Diagnosis not present

## 2015-11-29 DIAGNOSIS — I2510 Atherosclerotic heart disease of native coronary artery without angina pectoris: Secondary | ICD-10-CM | POA: Diagnosis not present

## 2015-11-29 DIAGNOSIS — I10 Essential (primary) hypertension: Secondary | ICD-10-CM

## 2015-11-29 DIAGNOSIS — K529 Noninfective gastroenteritis and colitis, unspecified: Secondary | ICD-10-CM | POA: Diagnosis not present

## 2015-11-29 DIAGNOSIS — Z932 Ileostomy status: Secondary | ICD-10-CM

## 2015-11-29 LAB — CBC
HEMATOCRIT: 37.3 % (ref 36.0–46.0)
HEMOGLOBIN: 12 g/dL (ref 12.0–15.0)
MCH: 27.2 pg (ref 26.0–34.0)
MCHC: 32.2 g/dL (ref 30.0–36.0)
MCV: 84.6 fL (ref 78.0–100.0)
Platelets: 182 10*3/uL (ref 150–400)
RBC: 4.41 MIL/uL (ref 3.87–5.11)
RDW: 13.1 % (ref 11.5–15.5)
WBC: 12.4 10*3/uL — ABNORMAL HIGH (ref 4.0–10.5)

## 2015-11-29 LAB — COMPREHENSIVE METABOLIC PANEL
ALK PHOS: 167 U/L — AB (ref 38–126)
ALT: 54 U/L (ref 14–54)
ANION GAP: 7 (ref 5–15)
AST: 108 U/L — AB (ref 15–41)
Albumin: 3.2 g/dL — ABNORMAL LOW (ref 3.5–5.0)
BILIRUBIN TOTAL: 1.3 mg/dL — AB (ref 0.3–1.2)
BUN: 17 mg/dL (ref 6–20)
CALCIUM: 8.8 mg/dL — AB (ref 8.9–10.3)
CO2: 20 mmol/L — ABNORMAL LOW (ref 22–32)
Chloride: 108 mmol/L (ref 101–111)
Creatinine, Ser: 1.28 mg/dL — ABNORMAL HIGH (ref 0.44–1.00)
GFR calc Af Amer: 47 mL/min — ABNORMAL LOW (ref >60)
GFR, EST NON AFRICAN AMERICAN: 40 mL/min — AB (ref >60)
Glucose, Bld: 114 mg/dL — ABNORMAL HIGH (ref 65–99)
POTASSIUM: 4.1 mmol/L (ref 3.5–5.1)
Sodium: 135 mmol/L (ref 135–145)
TOTAL PROTEIN: 6.7 g/dL (ref 6.5–8.1)

## 2015-11-29 LAB — TROPONIN I: TROPONIN I: 0.03 ng/mL (ref <0.031)

## 2015-11-29 LAB — CREATININE, URINE, RANDOM: CREATININE, URINE: 26.8 mg/dL

## 2015-11-29 LAB — SODIUM, URINE, RANDOM: Sodium, Ur: 69 mmol/L

## 2015-11-29 MED ORDER — ACETAMINOPHEN 325 MG PO TABS: 650.0000 mg | ORAL_TABLET | Freq: Four times a day (QID) | ORAL | Status: DC | PRN

## 2015-11-29 MED ORDER — ONDANSETRON HCL 4 MG PO TABS
4.0000 mg | ORAL_TABLET | Freq: Four times a day (QID) | ORAL | Status: DC | PRN
  Administered 2015-11-29: 4 mg via ORAL
  Filled 2015-11-29: qty 1

## 2015-11-29 MED ORDER — LABETALOL HCL 5 MG/ML IV SOLN
5.0000 mg | INTRAVENOUS | Status: DC | PRN
  Filled 2015-11-29: qty 4

## 2015-11-29 MED ORDER — MOMETASONE FURO-FORMOTEROL FUM 100-5 MCG/ACT IN AERO
2.0000 | INHALATION_SPRAY | Freq: Two times a day (BID) | RESPIRATORY_TRACT | Status: DC
  Administered 2015-11-29 – 2015-11-30 (×3): 2 via RESPIRATORY_TRACT
  Filled 2015-11-29 (×2): qty 8.8

## 2015-11-29 MED ORDER — SODIUM CHLORIDE 0.9 % IV SOLN
INTRAVENOUS | Status: DC
Start: 2015-11-29 — End: 2015-11-30
  Administered 2015-11-29 – 2015-11-30 (×2): via INTRAVENOUS

## 2015-11-29 MED ORDER — OXYCODONE HCL 5 MG PO TABS
5.0000 mg | ORAL_TABLET | ORAL | Status: DC | PRN
  Administered 2015-11-29 – 2015-11-30 (×5): 5 mg via ORAL
  Filled 2015-11-29 (×5): qty 1

## 2015-11-29 MED ORDER — PANTOPRAZOLE SODIUM 40 MG PO TBEC
40.0000 mg | DELAYED_RELEASE_TABLET | Freq: Two times a day (BID) | ORAL | Status: DC
  Administered 2015-11-29 – 2015-11-30 (×4): 40 mg via ORAL
  Filled 2015-11-29 (×4): qty 1

## 2015-11-29 MED ORDER — ACETAMINOPHEN 650 MG RE SUPP: 650.0000 mg | Freq: Four times a day (QID) | RECTAL | Status: DC | PRN

## 2015-11-29 MED ORDER — HYDROMORPHONE HCL 1 MG/ML IJ SOLN: 0.5000 mg | INTRAMUSCULAR | Status: DC | PRN

## 2015-11-29 MED ORDER — ONDANSETRON HCL 4 MG/2ML IJ SOLN
4.0000 mg | Freq: Four times a day (QID) | INTRAMUSCULAR | Status: DC | PRN
  Administered 2015-11-29: 4 mg via INTRAVENOUS
  Filled 2015-11-29: qty 2

## 2015-11-29 MED ORDER — ENOXAPARIN SODIUM 30 MG/0.3ML ~~LOC~~ SOLN
30.0000 mg | Freq: Every day | SUBCUTANEOUS | Status: DC
  Administered 2015-11-29 – 2015-11-30 (×2): 30 mg via SUBCUTANEOUS
  Filled 2015-11-29 (×2): qty 0.3

## 2015-11-29 NOTE — H&P (Signed)
History and Physical  Patient Name: [name]     FIE:[id number]    DOB: [birth year]    DOA: 11/28/2015 Referring physician: Dr. Edwyna Perfect PCP: Lonia Blood, MD      Chief Complaint: Abdominal pain  HPI: [name] is a 75 y.o. female with a past medical history significant for HTN, CAD s/p NSTEMI in 2011, CKD, and perforated duodenum during ERCP in 2011 requiring partial bowel resection and ostomy who presents with abdominal pain and vomiting for one day.  All history is collected through telephonic interpreter. The patient was in her normal state of health until today when she developed severe colicky abdominal pain near her ostomy. She had several episodes of nonbloody nonbilious vomit, and increased output from her ostomy.  The pain persisted all evening and so she came to the ER.  In the ED, the patient was afebrile, hemodynamically stable, hypertensive, and saturating well on room air. She had hyperkalemia and AKI.  A CT of the abdomen and pelvis with contrast showed thickening of the distal stomach and proximal duodenum consistent with gastroenteritis, and without any evidence of obstruction with transition point, or identifiable abnormality at her stoma. TRH were asked to evaluate for admission for AKI and vomiting.     Review of Systems:  Pt complains of abdominal pain, vomiting, increased ostomy output.. Pt denies any fever, chills, hematemesis, blood in her ostomy, cough.  All other systems negative except as just noted or noted in the history of present illness.  No Known Allergies  Prior to Admission medications   Medication Sig Start Date End Date Taking? Authorizing Provider  albuterol (PROVENTIL HFA;VENTOLIN HFA) 108 (90 BASE) MCG/ACT inhaler Inhale 1 puff into the lungs every 6 (six) hours as needed for wheezing or shortness of breath.    Historical Provider, MD  Fluticasone-Salmeterol (ADVAIR) 250-50 MCG/DOSE AEPB Inhale 1 puff into the lungs 2 (two) times daily.     Historical Provider, MD  pantoprazole (PROTONIX) 40 MG tablet Take 1 tablet (40 mg total) by mouth 2 (two) times daily. 08/30/14   [name], MD    Past Medical History  Diagnosis Date  . Asthma   . NSTEMI (non-ST elevated myocardial infarction) ([facility]) 2011    medically managed  . Paroxysmal atrial fibrillation ([facility])     Hx of amiodarone use  . Acute gastric ulcer with bleeding 11/09/2013    Past Surgical History  Procedure Laterality Date  . Colostomy Left 2011    Dr. Carolynne Edouard  . Partial colectomy  2011    necrotic bowel after lap chole  . Laparoscopic cholecystectomy  2011  . Ercp  2011  . Cardiac catheterization  2011    100% occlusion RCA, 80% stenosis circumflex, 60% stenosis LAD  . Esophagogastroduodenoscopy N/A 11/08/2013    Procedure: ESOPHAGOGASTRODUODENOSCOPY (EGD);  Surgeon: Theda Belfast, MD;  Location: [facility] ENDOSCOPY;  Service: Endoscopy;  Laterality: N/A;  . Esophagogastroduodenoscopy N/A 08/28/2014    Procedure: ESOPHAGOGASTRODUODENOSCOPY (EGD);  Surgeon: Barrie Folk, MD;  Location[facility] ENDOSCOPY;  Service: Endoscopy;  Laterality: N/A;    Family history: Parents deceased.  Social History: Patient lives with her husband, who has heart disease.  She has a son who is an Programme researcher, broadcasting/film/video.  She walks wtihout a walker.  She is from [place] originally.       Physical Exam: BP 137/116 mmHg  Pulse 67  Temp(Src) 97.5 F (36.4 C)  Resp 16  SpO2 100% General appearance: Frail appearing elderly adult female,  alert and in moderate distress from pain and nausea.   Eyes: Anicteric, conjunctiva pink, lids and lashes normal.     ENT: No nasal deformity, discharge, or epistaxis.  OP moist without lesions.  Edentulous. Lymph: No cervical, supraclavicular lymphadenopathy. Skin: Warm and dry.   Cardiac: RRR, nl S1-S2, no murmurs appreciated.  Respiratory: Normal respiratory rate and rhythm.  CTAB without rales or wheezes. Abdomen: Abdomen soft without rigidity.  No focal TTP.  Ostomy with soft stool, no frank blood.  No ascites, distension.   Neuro: Sensorium intact and responding to questions, attention normal.  Speech seems fluent.  Moves all extremities equally and with normal coordination.    Psych: Behavior appropriate.      Labs on Admission:  The metabolic panel shows hyponatremia, mild hyperkalemia, low bicarbonate, serum creatinine 1.54 mg/dL from a baseline around 1 mg/dL. The alkaline phosphatase is slightly elevated but the transaminases and bilirubin are normal. The lipase is normal. The troponin is negative. Urinalysis shows no WBCs or RBCs, but has some hyalin casts.. The complete blood count shows no leukocytosis, anemia or thrombocytopenia.   Radiological Exams on Admission: Personally reviewed: Dg Chest Portable 1 View 11/28/2015 No focal opacity.  Ct Angio Chest Abd/pel W/ And/or W/o 11/28/2015 "IMPRESSION: No evidence of aortic dissection or aneurysm. No CT evidence of pulmonary embolism. Emphysema. A patchy area of nodularity at the right lung base concerning for pneumonia. Inflammatory changes and thickening of the distal stomach and proximal duodenum concerning for gastroduodenitis. Clinical correlation is recommended. No evidence of bowel obstruction. 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 Postsurgical changes of right hemicolectomy as well as stable appearing postsurgical changes of the anterior abdominal wall."   EKG: Independently reviewed. Rate 58 with PACs.  Some diffuse STE, concave up, not consistent with STEMI, as discussed with Cardiology on call.    Assessment/Plan 1. AKI:  This is new.  Bland urine sediment.  Likely functional from volume loss with diarrhea, emesis.    -IVF -Check urine electrolytes -Trend BMP   2. Viral gastroenteritis:  -Fluids and supportive cares with ondansetron and analgesics  3. HTN and history of CAD:  Hypertensive at admission.  Not on  antihypertensives at this time. -Labetalol PRN for BP > 180/110 overnight.  4. Hyperkalemia:  Due to AKI -Fluid resuscitation and trend K  5. Ostomy:  No evidence of obstruction on CT. -Consult to WOC  6. Alkaline phosphatase elevation: Unclear etiology.  CT negative. -Trend LFT     DVT PPx: Lovenox Diet: Regular Consultants: None Code Status: Full Family Communication: None  Medical decision making: What exists of the patient's previous chart was reviewed in depth and the case was discussed with Dr. Edwyna Perfect. Patient seen 1:38 AM on 11/29/2015.  Disposition Plan:  I recommend admission to med surg observation status.  Status stable.  Anticipate fluid resuscitation and repeat electrolytes.  If K and Cr resolved to normal, discharge this afternoon or tomrorow with repeat Cr within one week given contrast load today.      Alberteen Sam [facility] Pager [phone]

## 2015-11-29 NOTE — Progress Notes (Addendum)
Patient seen and examined  75 y.o. female with a past medical history significant for HTN, CAD s/p NSTEMI in 2011, CKD, and perforated duodenum during ERCP in 2011 requiring partial bowel resection and ostomy who presents with abdominal pain and vomiting for one day. In the ED, the patient was afebrile, hemodynamically stable, hypertensive, and saturating well on room air. She had hyperkalemia and AKI. A CT of the abdomen and pelvis with contrast showed thickening of the distal stomach and proximal duodenum consistent with gastroenteritis, and without any evidence of obstruction with transition point, or identifiable abnormality at her stoma  Assessment and plan 1. AKI: improving 1.54>1.28 This is new. Bland urine sediment. Likely functional from volume loss with diarrhea, emesis.  -cont IVF -Trend BMP   2. Viral gastroenteritis:  -Fluids and supportive cares with ondansetron and analgesics  3. HTN and history of CAD: NSTEMI August 18-21 with LHC with CTO RCA, 60% LAD, 80% LCx and EF at that time of 45-50% EF treated medically. Cardiology consulted to review her ECG,Lost to f/u Will review echo Repeat trop pending  Hypertensive at admission. Not on antihypertensives at this time. -Labetalol PRN for BP > 180/110 overnight.  4. Hyperkalemia:  Due to AKI -Fluid resuscitation and trend K  5. Ostomy:  No evidence of obstruction on CT. -Consult to WOC  6. Alkaline phosphatase elevation: Unclear etiology. CT negative. -Trend LFT

## 2015-11-29 NOTE — Evaluation (Signed)
Physical Therapy Evaluation and Discharge Patient Details Name: [name] MRN: [id number] DOB: June 10, 1941 Today's Date: 11/29/2015   History of Present Illness  Presented to hospital with severe abd pain. CT abd indicative of Viral gastroenteritis PMHx-MI, hemicolectomy with colostomy    Clinical Impression  Patient evaluated by Physical Therapy with no further acute PT needs identified. Patient has mild balance deficits at baseline and uses a cane at times. Currently she is slightly more unsteady, however anticipate this is due to her illness (limited po intake and activity). Anticipate recovery to her baseline as her illness improves. She has family assistance 24/7 on discharge. PT is signing off. Thank you for this referral.     Follow Up Recommendations No PT follow up;Supervision for mobility/OOB    Equipment Recommendations  None recommended by PT    Recommendations for Other Services       Precautions / Restrictions Precautions Precautions: Fall      Mobility  Bed Mobility Overal bed mobility: Modified Independent             General bed mobility comments: out of and back into bed (pt very agile and climbs into bed with knee first, turns to sit, and then supine)  Transfers Overall transfer level: Needs assistance Equipment used: Rolling walker (2 wheeled) Transfers: Sit to/from Stand Sit to Stand: Min assist         General transfer comment: x2 (bed, toilet); pt normally uses cane and unfamiliar with use of RW, therefore required assist to stabilize rW as she comes to stand  Ambulation/Gait Ambulation/Gait assistance: Min assist Ambulation Distance (Feet): 10 Feet (x2) Assistive device: Rolling walker (2 wheeled) Gait Pattern/deviations: Step-through pattern;Decreased stride length;Wide base of support;Shuffle (staggering posteriorly twice)   Gait velocity interpretation: Below normal speed for age/gender General Gait Details: maneuvers RW well  around obstacles into bathroom. Feels a bit weak due to illness and lack of mobility.  Stairs            Wheelchair Mobility    Modified Rankin (Stroke Patients Only)       Balance Overall balance assessment: Needs assistance Sitting-balance support: No upper extremity supported;Feet unsupported Sitting balance-Leahy Scale: Good     Standing balance support: No upper extremity supported Standing balance-Leahy Scale: Fair Standing balance comment: can stand statically                             Pertinent Vitals/Pain Pain Assessment: Faces Faces Pain Scale: No hurt    Home Living Family/patient expects to be discharged to:: Private residence Living Arrangements: Spouse/significant other;Children;Other relatives Available Help at Discharge: Family;Available 24 hours/day           Home Equipment: Cane - single point      Prior Function Level of Independence: Independent with assistive device(s)               Hand Dominance        Extremity/Trunk Assessment   Upper Extremity Assessment: Overall WFL for tasks assessed           Lower Extremity Assessment: Overall WFL for tasks assessed      Cervical / Trunk Assessment: Normal  Communication   Communication: Prefers language other than English  Cognition Arousal/Alertness: Awake/alert Behavior During Therapy: WFL for tasks assessed/performed Overall Cognitive Status: Difficult to assess  General Comments      Exercises        Assessment/Plan    PT Assessment Patent does not need any further PT services  PT Diagnosis Difficulty walking   PT Problem List    PT Treatment Interventions     PT Goals (Current goals can be found in the Care Plan section) Acute Rehab PT Goals PT Goal Formulation: All assessment and education complete, DC therapy    Frequency     Barriers to discharge        Co-evaluation               End of Session    Activity Tolerance: Patient limited by fatigue (only wanted to return to bed to sleep) Patient left: in bed;with call bell/phone within reach;with bed alarm set Nurse Communication: Mobility status    Functional Assessment Tool Used: clinical judgement Functional Limitation: Mobility: Walking and moving around Mobility: Walking and Moving Around Current Status ([id number]): At least 1 percent but less than 20 percent impaired, limited or restricted Mobility: Walking and Moving Around Goal Status [phone]): At least 1 percent but less than 20 percent impaired, limited or restricted Mobility: Walking and Moving Around Discharge Status [phone]): At least 1 percent but less than 20 percent impaired, limited or restricted    Time: 3086-5784 PT Time Calculation (min) (ACUTE ONLY): 10 min   Charges:   PT Evaluation $PT Eval Low Complexity: 1 Procedure     PT G Codes:   PT G-Codes **NOT FOR INPATIENT CLASS** Functional Assessment Tool Used: clinical judgement Functional Limitation: Mobility: Walking and moving around Mobility: Walking and Moving Around Current Status (O9629): At least 1 percent but less than 20 percent impaired, limited or restricted Mobility: Walking and Moving Around Goal Status [phone]): At least 1 percent but less than 20 percent impaired, limited or restricted Mobility: Walking and Moving Around Discharge Status [phone]): At least 1 percent but less than 20 percent impaired, limited or restricted    Ervan Heber 11/29/2015, 4:54 PM Pager [phone]

## 2015-11-29 NOTE — Progress Notes (Signed)
Subjective: Pt complains of upper abdominal pain, epigastric pain  No CP   Objective: Filed Vitals:   11/29/15 0200 11/29/15 0232 11/29/15 0607 11/29/15 0730  BP: 156/73 106/80 98/61   Pulse: 61 62 64   Temp:  97.5 F (36.4 C) 98.1 F (36.7 C)   TempSrc:  Oral Oral   Resp: 12 20 18    Height:    4\' 9"  (1.448 m)  Weight:    50 kg (110 lb 3.7 oz)  SpO2: 100% 99% 98%    Weight change:   Intake/Output Summary (Last 24 hours) at 11/29/15 0818 Last data filed at 11/29/15 0235  Gross per 24 hour  Intake   1000 ml  Output    230 ml  Net    770 ml    General: Alert, awake, oriented x3, in no acute distress Neck:  JVP is normal Heart: Regular rate and rhythm, without murmurs, rubs, gallops.  Lungs: Clear to auscultation.  No rales or wheezes. Abd  Colostomy draining  Supple   Exemities:  No edema.      Lab Results: Results for orders placed or performed during the hospital encounter of 11/28/15 (from the past 24 hour(s))  Basic metabolic panel     Status: Abnormal   Collection Time: 11/28/15  8:40 PM  Result Value Ref Range   Sodium 132 (L) 135 - 145 mmol/L   Potassium 5.4 (H) 3.5 - 5.1 mmol/L   Chloride 107 101 - 111 mmol/L   CO2 16 (L) 22 - 32 mmol/L   Glucose, Bld 116 (H) 65 - 99 mg/dL   BUN 20 6 - 20 mg/dL   Creatinine, Ser 1.61 (H) 0.44 - 1.00 mg/dL   Calcium 9.0 8.9 - 09.6 mg/dL   GFR calc non Af Amer 32 (L) >60 mL/min   GFR calc Af Amer 37 (L) >60 mL/min   Anion gap 9 5 - 15  CBC     Status: None   Collection Time: 11/28/15  8:40 PM  Result Value Ref Range   WBC 9.9 4.0 - 10.5 K/uL   RBC 4.57 3.87 - 5.11 MIL/uL   Hemoglobin 12.5 12.0 - 15.0 g/dL   HCT 04.5 40.9 - 81.1 %   MCV 85.1 78.0 - 100.0 fL   MCH 27.4 26.0 - 34.0 pg   MCHC 32.1 30.0 - 36.0 g/dL   RDW 91.4 78.2 - 95.6 %   Platelets 217 150 - 400 K/uL  Lipase, blood     Status: None   Collection Time: 11/28/15  8:40 PM  Result Value Ref Range   Lipase 45 11 - 51 U/L  Hepatic function panel      Status: Abnormal   Collection Time: 11/28/15  8:40 PM  Result Value Ref Range   Total Protein 7.9 6.5 - 8.1 g/dL   Albumin 3.8 3.5 - 5.0 g/dL   AST 40 15 - 41 U/L   ALT 26 14 - 54 U/L   Alkaline Phosphatase 174 (H) 38 - 126 U/L   Total Bilirubin 0.8 0.3 - 1.2 mg/dL   Bilirubin, Direct 0.2 0.1 - 0.5 mg/dL   Indirect Bilirubin 0.6 0.3 - 0.9 mg/dL  I-stat troponin, ED (not at [facility])     Status: None   Collection Time: 11/28/15  8:44 PM  Result Value Ref Range   Troponin i, poc 0.00 0.00 - 0.08 ng/mL   Comment 3          Urinalysis, Routine w  reflex microscopic (not at [facility])     Status: Abnormal   Collection Time: 11/28/15 10:38 PM  Result Value Ref Range   Color, Urine YELLOW YELLOW   APPearance CLEAR CLEAR   Specific Gravity, Urine 1.017 1.005 - 1.030   pH 5.5 5.0 - 8.0   Glucose, UA NEGATIVE NEGATIVE mg/dL   Hgb urine dipstick TRACE (A) NEGATIVE   Bilirubin Urine NEGATIVE NEGATIVE   Ketones, ur NEGATIVE NEGATIVE mg/dL   Protein, ur NEGATIVE NEGATIVE mg/dL   Nitrite NEGATIVE NEGATIVE   Leukocytes, UA NEGATIVE NEGATIVE  Urine microscopic-add on     Status: Abnormal   Collection Time: 11/28/15 10:38 PM  Result Value Ref Range   Squamous Epithelial / LPF 0-5 (A) NONE SEEN   WBC, UA 0-5 0 - 5 WBC/hpf   RBC / HPF 0-5 0 - 5 RBC/hpf   Bacteria, UA RARE (A) NONE SEEN   Casts HYALINE CASTS (A) NEGATIVE  CBC     Status: Abnormal   Collection Time: 11/29/15  4:01 AM  Result Value Ref Range   WBC 12.4 (H) 4.0 - 10.5 K/uL   RBC 4.41 3.87 - 5.11 MIL/uL   Hemoglobin 12.0 12.0 - 15.0 g/dL   HCT 16.1 09.6 - 04.5 %   MCV 84.6 78.0 - 100.0 fL   MCH 27.2 26.0 - 34.0 pg   MCHC 32.2 30.0 - 36.0 g/dL   RDW 40.9 81.1 - 91.4 %   Platelets 182 150 - 400 K/uL  Comprehensive metabolic panel     Status: Abnormal   Collection Time: 11/29/15  4:01 AM  Result Value Ref Range   Sodium 135 135 - 145 mmol/L   Potassium 4.1 3.5 - 5.1 mmol/L   Chloride 108 101 - 111 mmol/L   CO2 20 (L) 22 - 32  mmol/L   Glucose, Bld 114 (H) 65 - 99 mg/dL   BUN 17 6 - 20 mg/dL   Creatinine, Ser 7.82 (H) 0.44 - 1.00 mg/dL   Calcium 8.8 (L) 8.9 - 10.3 mg/dL   Total Protein 6.7 6.5 - 8.1 g/dL   Albumin 3.2 (L) 3.5 - 5.0 g/dL   AST 956 (H) 15 - 41 U/L   ALT 54 14 - 54 U/L   Alkaline Phosphatase 167 (H) 38 - 126 U/L   Total Bilirubin 1.3 (H) 0.3 - 1.2 mg/dL   GFR calc non Af Amer 40 (L) >60 mL/min   GFR calc Af Amer 47 (L) >60 mL/min   Anion gap 7 5 - 15    Studies/Results: Dg Chest Portable 1 View  11/28/2015  CLINICAL DATA:  Acute onset of epigastric abdominal pain, radiating to the chest. Shortness of breath. Initial encounter. EXAM: PORTABLE CHEST 1 VIEW COMPARISON:  Chest radiograph performed 08/23/2014 FINDINGS: The lungs are well-aerated and clear. There is no evidence of focal opacification, pleural effusion or pneumothorax. The cardiomediastinal silhouette is borderline enlarged. No acute osseous abnormalities are seen. IMPRESSION: Borderline cardiomegaly.  Lungs remain grossly clear. Electronically Signed   By: Roanna Raider M.D.   On: 11/28/2015 21:19   Ct Angio Chest Aorta W/cm &/or Wo/cm  11/28/2015  CLINICAL DATA:  75 year old female with abdominal pain radiating to the chest. EXAM: CT ANGIOGRAPHY CHEST, ABDOMEN AND PELVIS TECHNIQUE: Multidetector CT imaging through the chest, abdomen and pelvis was performed using the standard protocol during bolus administration of intravenous contrast. Multiplanar reconstructed images and MIPs were obtained and reviewed to evaluate the vascular anatomy. CONTRAST:  80mL OMNIPAQUE IOHEXOL 350  MG/ML SOLN COMPARISON:  CT dated 08/23/2014 FINDINGS: CTA CHEST FINDINGS There is mild emphysematous changes of the lungs. Small right upper lobe calcified granuloma. There is a patchy area of nodularity at the right lung base concerning for pneumonia. Areas of ground-glass and hazy airspace opacity noted at the lung bases as well as the apical region, likely chronic or  related to atelectatic changes. There is no focal consolidation, pleural effusion, or pneumothorax. The central airways are patent. There is mild peribronchial thickening predominantly involving the lung bases. Bronchopneumonia is not excluded. There is slight irregularity of the aortic arch with noncalcified plaques. There is no aneurysm or dissection of the thoracic aorta. No CT evidence of pulmonary embolism. There is no cardiomegaly or pericardial effusion. There is no hilar or mediastinal adenopathy. The esophagus is grossly unremarkable. There is focal calcification of the left thyroid gland. Ultrasound may provide better evaluation. There is no axillary adenopathy. The chest wall soft tissues appear unremarkable. There is osteopenia with degenerative changes of the thoracic spine. No acute fracture. Review of the MIP images confirms the above findings. CTA ABDOMEN AND PELVIS FINDINGS No intra-abdominal free air or free fluid. Cholecystectomy. The liver, pancreas, spleen, adrenal glands appear unremarkable. There is moderate bilateral renal atrophy. There is no hydronephrosis on either side. The visualized ureters and urinary bladder appear unremarkable. The uterus and the ovaries are grossly unremarkable. There is thickening and inflammatory changes of the gastric antrum and pylorus as well as the second portion of the duodenum compatible with gastritis. There is postsurgical changes of right hemicolectomy. A and left lower quadrant ostomy is noted. There is herniation of multiple loops of small bowel along the left lower quadrant ostomy compatible with a parastomal hernia. There is narrowing of the bowel loop at the neck of the hernia. A normal caliber air-filled loop of bowel is seen within the hernia. Mild/low grade obstruction is less likely. There is also a small focal defect in the right anterior pelvic wall with herniation of multiple loops of small bowel without evidence of obstruction. There are  postsurgical changes and defects of the anterior abdominal wall similar to prior study. No fluid collection or inflammatory changes identified. There is atherosclerotic calcification of the abdominal aorta. There is no evidence of dissection or aneurysm. The origins of the celiac axis, SMA, IMA as well as the origins of the renal arteries are patent. No portal venous gas identified. There is no adenopathy. There is osteopenia with degenerative changes of the spine. No acute fracture. Review of the MIP images confirms the above findings. IMPRESSION: No evidence of aortic dissection or aneurysm. No CT evidence of pulmonary embolism. Emphysema. A patchy area of nodularity at the right lung base concerning for pneumonia. Inflammatory changes and thickening of the distal stomach and proximal duodenum concerning for gastroduodenitis. Clinical correlation is recommended. No evidence of bowel obstruction. 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 Postsurgical changes of right hemicolectomy as well as stable appearing postsurgical changes of the anterior abdominal wall. Electronically Signed   By: Elgie Collard M.D.   On: 11/28/2015 22:35   Ct Angio Abd/pel W/ And/or W/o  11/28/2015  CLINICAL DATA:  75 year old female with abdominal pain radiating to the chest. EXAM: CT ANGIOGRAPHY CHEST, ABDOMEN AND PELVIS TECHNIQUE: Multidetector CT imaging through the chest, abdomen and pelvis was performed using the standard protocol during bolus administration of intravenous contrast. Multiplanar reconstructed images and MIPs were obtained and reviewed to evaluate the vascular  anatomy. CONTRAST:  80mL OMNIPAQUE IOHEXOL 350 MG/ML SOLN COMPARISON:  CT dated 08/23/2014 FINDINGS: CTA CHEST FINDINGS There is mild emphysematous changes of the lungs. Small right upper lobe calcified granuloma. There is a patchy area of nodularity at the right lung base concerning for pneumonia. Areas of  ground-glass and hazy airspace opacity noted at the lung bases as well as the apical region, likely chronic or related to atelectatic changes. There is no focal consolidation, pleural effusion, or pneumothorax. The central airways are patent. There is mild peribronchial thickening predominantly involving the lung bases. Bronchopneumonia is not excluded. There is slight irregularity of the aortic arch with noncalcified plaques. There is no aneurysm or dissection of the thoracic aorta. No CT evidence of pulmonary embolism. There is no cardiomegaly or pericardial effusion. There is no hilar or mediastinal adenopathy. The esophagus is grossly unremarkable. There is focal calcification of the left thyroid gland. Ultrasound may provide better evaluation. There is no axillary adenopathy. The chest wall soft tissues appear unremarkable. There is osteopenia with degenerative changes of the thoracic spine. No acute fracture. Review of the MIP images confirms the above findings. CTA ABDOMEN AND PELVIS FINDINGS No intra-abdominal free air or free fluid. Cholecystectomy. The liver, pancreas, spleen, adrenal glands appear unremarkable. There is moderate bilateral renal atrophy. There is no hydronephrosis on either side. The visualized ureters and urinary bladder appear unremarkable. The uterus and the ovaries are grossly unremarkable. There is thickening and inflammatory changes of the gastric antrum and pylorus as well as the second portion of the duodenum compatible with gastritis. There is postsurgical changes of right hemicolectomy. A and left lower quadrant ostomy is noted. There is herniation of multiple loops of small bowel along the left lower quadrant ostomy compatible with a parastomal hernia. There is narrowing of the bowel loop at the neck of the hernia. A normal caliber air-filled loop of bowel is seen within the hernia. Mild/low grade obstruction is less likely. There is also a small focal defect in the right  anterior pelvic wall with herniation of multiple loops of small bowel without evidence of obstruction. There are postsurgical changes and defects of the anterior abdominal wall similar to prior study. No fluid collection or inflammatory changes identified. There is atherosclerotic calcification of the abdominal aorta. There is no evidence of dissection or aneurysm. The origins of the celiac axis, SMA, IMA as well as the origins of the renal arteries are patent. No portal venous gas identified. There is no adenopathy. There is osteopenia with degenerative changes of the spine. No acute fracture. Review of the MIP images confirms the above findings. IMPRESSION: No evidence of aortic dissection or aneurysm. No CT evidence of pulmonary embolism. Emphysema. A patchy area of nodularity at the right lung base concerning for pneumonia. Inflammatory changes and thickening of the distal stomach and proximal duodenum concerning for gastroduodenitis. Clinical correlation is recommended. No evidence of bowel obstruction. 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 Postsurgical changes of right hemicolectomy as well as stable appearing postsurgical changes of the anterior abdominal wall. Electronically Signed   By: Elgie Collard M.D.   On: 11/28/2015 22:35    Medications: {REviewed    @  Pt interviewed with [place] translater   1  Pain  Pt complains of abdominal pain  No CP  Epigastric region  I am not convinced it is cardiac angina  ? GI   I do not think pericarditis.   She does get SOB with  stairs  Hx reactive airways  Asthma  Does use inhalers.  Again, I am not convinced of it being anginal equivalent    2  CAD Hx of CAD by remote cath  Lost to f/u   Will review echo  Repeat trop pending    3   Lipids  Check panel      Dietrich Pates 11/29/2015, 8:18 AM

## 2015-11-29 NOTE — Progress Notes (Signed)
Pt is stable at this time, patient family at the the bedside

## 2015-11-29 NOTE — ED Provider Notes (Signed)
CSN: [id number]     Arrival date & time 11/28/15  2023 History   First MD Initiated Contact with Patient 11/28/15 2054     Chief Complaint  Patient presents with  . Code STEMI  . Abdominal Pain     (Consider location/radiation/quality/duration/timing/severity/associated sxs/prior Treatment) HPI   75 y F w PMH CAD, afib, colostomy, presenting with epigastric pain since 9am today with associated chest pain.  The patient has difficulty describing her pain, the pain is severe and non-radiating.  No fever/chills.  No sob.  No cough.    Past Medical History  Diagnosis Date  . Asthma   . NSTEMI (non-ST elevated myocardial infarction) (HCC) 2011    medically managed  . Paroxysmal atrial fibrillation (HCC)     Hx of amiodarone use  . Acute gastric ulcer with bleeding 11/09/2013   Past Surgical History  Procedure Laterality Date  . Colostomy Left 2011    Dr. Carolynne Edouard  . Partial colectomy  2011    necrotic bowel after lap chole  . Laparoscopic cholecystectomy  2011  . Ercp  2011  . Cardiac catheterization  2011    100% occlusion RCA, 80% stenosis circumflex, 60% stenosis LAD  . Esophagogastroduodenoscopy N/A 11/08/2013    Procedure: ESOPHAGOGASTRODUODENOSCOPY (EGD);  Surgeon: Theda Belfast, MD;  Location: [facility] ENDOSCOPY;  Service: Endoscopy;  Laterality: N/A;  . Esophagogastroduodenoscopy N/A 08/28/2014    Procedure: ESOPHAGOGASTRODUODENOSCOPY (EGD);  Surgeon: Barrie Folk, MD;  Location: [facility] ENDOSCOPY;  Service: Endoscopy;  Laterality: N/A;   History reviewed. No pertinent family history. Social History  Substance Use Topics  . Smoking status: Never Smoker   . Smokeless tobacco: None  . Alcohol Use: No   OB History    No data available     Review of Systems  Constitutional: Negative for fever and chills.  HENT: Negative for nosebleeds.   Eyes: Negative for visual disturbance.  Respiratory: Negative for cough and shortness of breath.   Cardiovascular: Negative for chest pain.   Gastrointestinal: Negative for nausea, vomiting, abdominal pain, diarrhea and constipation.  Genitourinary: Negative for dysuria.  Skin: Negative for rash.  Neurological: Negative for weakness.  All other systems reviewed and are negative.     Allergies  Review of patient's allergies indicates no known allergies.  Home Medications   Prior to Admission medications   Medication Sig Start Date End Date Taking? Authorizing Provider  albuterol (PROVENTIL HFA;VENTOLIN HFA) 108 (90 BASE) MCG/ACT inhaler Inhale 1 puff into the lungs every 6 (six) hours as needed for wheezing or shortness of breath.    Historical Provider, MD  Fluticasone-Salmeterol (ADVAIR) 250-50 MCG/DOSE AEPB Inhale 1 puff into the lungs 2 (two) times daily.    Historical Provider, MD  pantoprazole (PROTONIX) 40 MG tablet Take 1 tablet (40 mg total) by mouth 2 (two) times daily. 08/30/14   [name], MD   BP 106/80 mmHg  Pulse 62  Temp(Src) 97.5 F (36.4 C) (Oral)  Resp 20  SpO2 99% Physical Exam  Constitutional: She is oriented to person, place, and time. No distress.  HENT:  Head: Normocephalic and atraumatic.  Eyes: EOM are normal. Pupils are equal, round, and reactive to light.  Neck: Normal range of motion. Neck supple.  Cardiovascular: Normal rate and intact distal pulses.   Pulmonary/Chest: No respiratory distress.  Abdominal: Soft. There is tenderness (epigastric). There is no rebound and no guarding.  Musculoskeletal: Normal range of motion.  Neurological: She is alert and oriented to person, place,  and time.  Skin: No rash noted. She is not diaphoretic.  Psychiatric: She has a normal mood and affect.    ED Course  Procedures (including critical care time) Labs Review Labs Reviewed  BASIC METABOLIC PANEL - Abnormal; Notable for the following:    Sodium 132 (*)    Potassium 5.4 (*)    CO2 16 (*)    Glucose, Bld 116 (*)    Creatinine, Ser 1.54 (*)    GFR calc non Af Amer 32 (*)    GFR calc  Af Amer 37 (*)    All other components within normal limits  HEPATIC FUNCTION PANEL - Abnormal; Notable for the following:    Alkaline Phosphatase 174 (*)    All other components within normal limits  URINALYSIS, ROUTINE W REFLEX MICROSCOPIC (NOT AT [facility]) - Abnormal; Notable for the following:    Hgb urine dipstick TRACE (*)    All other components within normal limits  URINE MICROSCOPIC-ADD ON - Abnormal; Notable for the following:    Squamous Epithelial / LPF 0-5 (*)    Bacteria, UA RARE (*)    Casts HYALINE CASTS (*)    All other components within normal limits  URINE CULTURE  CBC  LIPASE, BLOOD  CBC  COMPREHENSIVE METABOLIC PANEL  CREATININE, URINE, RANDOM  SODIUM, URINE, RANDOM  I-STAT TROPOININ, ED    Imaging Review Dg Chest Portable 1 View  11/28/2015  CLINICAL DATA:  Acute onset of epigastric abdominal pain, radiating to the chest. Shortness of breath. Initial encounter. EXAM: PORTABLE CHEST 1 VIEW COMPARISON:  Chest radiograph performed 08/23/2014 FINDINGS: The lungs are well-aerated and clear. There is no evidence of focal opacification, pleural effusion or pneumothorax. The cardiomediastinal silhouette is borderline enlarged. No acute osseous abnormalities are seen. IMPRESSION: Borderline cardiomegaly.  Lungs remain grossly clear. Electronically Signed   By: Roanna Raider M.D.   On: 11/28/2015 21:19   Ct Angio Chest Aorta W/cm &/or Wo/cm  11/28/2015  CLINICAL DATA:  75 year old female with abdominal pain radiating to the chest. EXAM: CT ANGIOGRAPHY CHEST, ABDOMEN AND PELVIS TECHNIQUE: Multidetector CT imaging through the chest, abdomen and pelvis was performed using the standard protocol during bolus administration of intravenous contrast. Multiplanar reconstructed images and MIPs were obtained and reviewed to evaluate the vascular anatomy. CONTRAST:  80mL OMNIPAQUE IOHEXOL 350 MG/ML SOLN COMPARISON:  CT dated 08/23/2014 FINDINGS: CTA CHEST FINDINGS There is mild emphysematous  changes of the lungs. Small right upper lobe calcified granuloma. There is a patchy area of nodularity at the right lung base concerning for pneumonia. Areas of ground-glass and hazy airspace opacity noted at the lung bases as well as the apical region, likely chronic or related to atelectatic changes. There is no focal consolidation, pleural effusion, or pneumothorax. The central airways are patent. There is mild peribronchial thickening predominantly involving the lung bases. Bronchopneumonia is not excluded. There is slight irregularity of the aortic arch with noncalcified plaques. There is no aneurysm or dissection of the thoracic aorta. No CT evidence of pulmonary embolism. There is no cardiomegaly or pericardial effusion. There is no hilar or mediastinal adenopathy. The esophagus is grossly unremarkable. There is focal calcification of the left thyroid gland. Ultrasound may provide better evaluation. There is no axillary adenopathy. The chest wall soft tissues appear unremarkable. There is osteopenia with degenerative changes of the thoracic spine. No acute fracture. Review of the MIP images confirms the above findings. CTA ABDOMEN AND PELVIS FINDINGS No intra-abdominal free air or free fluid. Cholecystectomy.  The liver, pancreas, spleen, adrenal glands appear unremarkable. There is moderate bilateral renal atrophy. There is no hydronephrosis on either side. The visualized ureters and urinary bladder appear unremarkable. The uterus and the ovaries are grossly unremarkable. There is thickening and inflammatory changes of the gastric antrum and pylorus as well as the second portion of the duodenum compatible with gastritis. There is postsurgical changes of right hemicolectomy. A and left lower quadrant ostomy is noted. There is herniation of multiple loops of small bowel along the left lower quadrant ostomy compatible with a parastomal hernia. There is narrowing of the bowel loop at the neck of the hernia. A  normal caliber air-filled loop of bowel is seen within the hernia. Mild/low grade obstruction is less likely. There is also a small focal defect in the right anterior pelvic wall with herniation of multiple loops of small bowel without evidence of obstruction. There are postsurgical changes and defects of the anterior abdominal wall similar to prior study. No fluid collection or inflammatory changes identified. There is atherosclerotic calcification of the abdominal aorta. There is no evidence of dissection or aneurysm. The origins of the celiac axis, SMA, IMA as well as the origins of the renal arteries are patent. No portal venous gas identified. There is no adenopathy. There is osteopenia with degenerative changes of the spine. No acute fracture. Review of the MIP images confirms the above findings. IMPRESSION: No evidence of aortic dissection or aneurysm. No CT evidence of pulmonary embolism. Emphysema. A patchy area of nodularity at the right lung base concerning for pneumonia. Inflammatory changes and thickening of the distal stomach and proximal duodenum concerning for gastroduodenitis. Clinical correlation is recommended. No evidence of bowel obstruction. 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 Postsurgical changes of right hemicolectomy as well as stable appearing postsurgical changes of the anterior abdominal wall. Electronically Signed   By: Elgie Collard M.D.   On: 11/28/2015 22:35   Ct Angio Abd/pel W/ And/or W/o  11/28/2015  CLINICAL DATA:  75 year old female with abdominal pain radiating to the chest. EXAM: CT ANGIOGRAPHY CHEST, ABDOMEN AND PELVIS TECHNIQUE: Multidetector CT imaging through the chest, abdomen and pelvis was performed using the standard protocol during bolus administration of intravenous contrast. Multiplanar reconstructed images and MIPs were obtained and reviewed to evaluate the vascular anatomy. CONTRAST:  80mL OMNIPAQUE  IOHEXOL 350 MG/ML SOLN COMPARISON:  CT dated 08/23/2014 FINDINGS: CTA CHEST FINDINGS There is mild emphysematous changes of the lungs. Small right upper lobe calcified granuloma. There is a patchy area of nodularity at the right lung base concerning for pneumonia. Areas of ground-glass and hazy airspace opacity noted at the lung bases as well as the apical region, likely chronic or related to atelectatic changes. There is no focal consolidation, pleural effusion, or pneumothorax. The central airways are patent. There is mild peribronchial thickening predominantly involving the lung bases. Bronchopneumonia is not excluded. There is slight irregularity of the aortic arch with noncalcified plaques. There is no aneurysm or dissection of the thoracic aorta. No CT evidence of pulmonary embolism. There is no cardiomegaly or pericardial effusion. There is no hilar or mediastinal adenopathy. The esophagus is grossly unremarkable. There is focal calcification of the left thyroid gland. Ultrasound may provide better evaluation. There is no axillary adenopathy. The chest wall soft tissues appear unremarkable. There is osteopenia with degenerative changes of the thoracic spine. No acute fracture. Review of the MIP images confirms the above findings. CTA ABDOMEN AND PELVIS FINDINGS No  intra-abdominal free air or free fluid. Cholecystectomy. The liver, pancreas, spleen, adrenal glands appear unremarkable. There is moderate bilateral renal atrophy. There is no hydronephrosis on either side. The visualized ureters and urinary bladder appear unremarkable. The uterus and the ovaries are grossly unremarkable. There is thickening and inflammatory changes of the gastric antrum and pylorus as well as the second portion of the duodenum compatible with gastritis. There is postsurgical changes of right hemicolectomy. A and left lower quadrant ostomy is noted. There is herniation of multiple loops of small bowel along the left lower quadrant  ostomy compatible with a parastomal hernia. There is narrowing of the bowel loop at the neck of the hernia. A normal caliber air-filled loop of bowel is seen within the hernia. Mild/low grade obstruction is less likely. There is also a small focal defect in the right anterior pelvic wall with herniation of multiple loops of small bowel without evidence of obstruction. There are postsurgical changes and defects of the anterior abdominal wall similar to prior study. No fluid collection or inflammatory changes identified. There is atherosclerotic calcification of the abdominal aorta. There is no evidence of dissection or aneurysm. The origins of the celiac axis, SMA, IMA as well as the origins of the renal arteries are patent. No portal venous gas identified. There is no adenopathy. There is osteopenia with degenerative changes of the spine. No acute fracture. Review of the MIP images confirms the above findings. IMPRESSION: No evidence of aortic dissection or aneurysm. No CT evidence of pulmonary embolism. Emphysema. A patchy area of nodularity at the right lung base concerning for pneumonia. Inflammatory changes and thickening of the distal stomach and proximal duodenum concerning for gastroduodenitis. Clinical correlation is recommended. No evidence of bowel obstruction. 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 Postsurgical changes of right hemicolectomy as well as stable appearing postsurgical changes of the anterior abdominal wall. Electronically Signed   By: Elgie Collard M.D.   On: 11/28/2015 22:35   I have personally reviewed and evaluated these images and lab results as part of my medical decision-making.   EKG Interpretation   Date/Time:  Thursday November 28 2015 20:24:30 EST Ventricular Rate:  59 PR Interval:  176 QRS Duration: 70 QT Interval:  460 QTC Calculation: 455 R Axis:   74 Text Interpretation:  Sinus bradycardia ST elevation in  Inferior leads and   Anterolateral leads ** ** ACUTE MI / STEMI ** ** When compared with ECG  of 08/26/2014, ** ** ACUTE MI / STEMI ** ** is now Present Confirmed by  [facility]  MD, DAVID ([id number]) on 11/28/2015 8:41:13 PM      MDM   Final diagnoses:  Epigastric pain    54 y F w PMH CAD, afib, colostomy, presenting with epigastric pain since 9am today with associated chest pain.  On exam she has epigastric ttp.  Concern for ACS vs. sbo vs. Bowel perforation vs aortic dissection.  Doubt pe given not tachycardic and not hypoxic  Initial ekg with some apparent st segment elevations in the inferior leads without reciprocal changes.  Have spoken w cards fellow regarding patient and they recommend awaiting trop. Trop has come back at 0.  Doubt MI.  Will obtain cbc/cmp/lipase/cta chest-abdomen-pelvis  Imaging with possible enteritis.  Bmp with aki with elevated K at 5.4.  Will perform shifting maneuvers with insulin.  Will admit for aki and enteritis.  No acute events in the ED    Silas Flood, MD 11/29/15 [phone]  Arby Barrette, MD 12/03/15 1911

## 2015-11-29 NOTE — Care Management Note (Signed)
Case Management Note  Patient Details  Name: [name] MRN: [id number] Date of Birth: December 24, 1940  Subjective/Objective:                  Date- 11-29-15 Initial Assessment Spoke with patient at the bedside along with daughters who interpreted/ answered questions.  Introduced self as Sports coach and explained role in discharge planning and how to be reached.  Verified patient lives in  [place] with family.  Verified patient anticipates to go home with family at time of discharge and will have full time supervision by family at this time to best of their knowledge.  Patient has DME cane and nebulizer. Expressed potential need for no other DME.  Patient denied needing help with their medication.  Patient  is driven by family to MD appointments.  Verified patient has PCP [place]. Patient states they currently receive HH services through no one.   Admission Comments: In obs for AKI   Lawerance Sabal RN BSN CM [phone]   Action/Plan:  DC to home in 1 day, no CM needs identified.  Expected Discharge Date:                  Expected Discharge Plan:  Home/Self Care  In-House Referral:     Discharge planning Services     Post Acute Care Choice:    Choice offered to:     DME Arranged:    DME Agency:     HH Arranged:    HH Agency:     Status of Service:  In process, will continue to follow  Medicare Important Message Given:    Date Medicare IM Given:    Medicare IM give by:    Date Additional Medicare IM Given:    Additional Medicare Important Message give by:     If discussed at Long Length of Stay Meetings, dates discussed:    Additional Comments:  Lawerance Sabal, RN 11/29/2015, 3:11 PM

## 2015-11-29 NOTE — ED Notes (Signed)
Attempted to call report

## 2015-11-29 NOTE — Consult Note (Addendum)
WOC ostomy consult note Stoma type/location: Consult requested for ostomy assistance. 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 Pt is able to care for her stoma prior to admission. Supplies ordered to the bedside for patient and staff nurse use. Please re-consult if further assistance is needed. Thank-you,  Cammie Mcgee MSN, RN, CWOCN, [place], CNS [phone]

## 2015-11-30 DIAGNOSIS — N179 Acute kidney failure, unspecified: Secondary | ICD-10-CM | POA: Diagnosis not present

## 2015-11-30 LAB — COMPREHENSIVE METABOLIC PANEL
ALBUMIN: 2.6 g/dL — AB (ref 3.5–5.0)
ALK PHOS: 120 U/L (ref 38–126)
ALT: 31 U/L (ref 14–54)
ANION GAP: 5 (ref 5–15)
AST: 36 U/L (ref 15–41)
BUN: 13 mg/dL (ref 6–20)
CALCIUM: 7.9 mg/dL — AB (ref 8.9–10.3)
CO2: 21 mmol/L — AB (ref 22–32)
Chloride: 113 mmol/L — ABNORMAL HIGH (ref 101–111)
Creatinine, Ser: 1.46 mg/dL — ABNORMAL HIGH (ref 0.44–1.00)
GFR calc Af Amer: 40 mL/min — ABNORMAL LOW (ref >60)
GFR calc non Af Amer: 34 mL/min — ABNORMAL LOW (ref >60)
GLUCOSE: 94 mg/dL (ref 65–99)
Potassium: 4.1 mmol/L (ref 3.5–5.1)
SODIUM: 139 mmol/L (ref 135–145)
Total Bilirubin: 0.7 mg/dL (ref 0.3–1.2)
Total Protein: 5.5 g/dL — ABNORMAL LOW (ref 6.5–8.1)

## 2015-11-30 LAB — CBC
HEMATOCRIT: 30.6 % — AB (ref 36.0–46.0)
HEMOGLOBIN: 9.8 g/dL — AB (ref 12.0–15.0)
MCH: 27.4 pg (ref 26.0–34.0)
MCHC: 32 g/dL (ref 30.0–36.0)
MCV: 85.5 fL (ref 78.0–100.0)
Platelets: 154 10*3/uL (ref 150–400)
RBC: 3.58 MIL/uL — ABNORMAL LOW (ref 3.87–5.11)
RDW: 13.6 % (ref 11.5–15.5)
WBC: 7.8 10*3/uL (ref 4.0–10.5)

## 2015-11-30 LAB — LIPID PANEL
CHOLESTEROL: 140 mg/dL (ref 0–200)
HDL: 29 mg/dL — ABNORMAL LOW (ref >40)
LDL CALC: 80 mg/dL (ref 0–99)
Total CHOL/HDL Ratio: 4.8 RATIO
Triglycerides: 157 mg/dL — ABNORMAL HIGH (ref <150)
VLDL: 31 mg/dL (ref 0–40)

## 2015-11-30 LAB — URINE CULTURE: CULTURE: NO GROWTH

## 2015-11-30 NOTE — Discharge Summary (Signed)
Physician Discharge Summary  [name] MRN: [id number] DOB/AGE: 1941-10-02 75 y.o.  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[place])   Gastroenteritis   Epigastric pain    Follow-up recommendations Follow-up with PCP in 3-5 days , including all  additional recommended appointments as below Follow-up CBC, CMP in 3-5 days      Medication List    STOP taking these medications        hydrochlorothiazide 25 MG tablet  Commonly known as:  HYDRODIURIL     ibuprofen 800 MG tablet  Commonly known as:  ADVIL,MOTRIN      TAKE these medications        albuterol 108 (90 Base) MCG/ACT inhaler  Commonly known as:  PROVENTIL HFA;VENTOLIN HFA  Inhale 1 puff into the lungs every 6 (six) hours as needed for wheezing or shortness of breath.     cetirizine 10 MG tablet  Commonly known as:  ZYRTEC  Take 10 mg by mouth daily.     Fluticasone-Salmeterol 250-50 MCG/DOSE Aepb  Commonly known as:  ADVAIR  Inhale 1 puff into the lungs 2 (two) times daily.     pantoprazole 40 MG tablet  Commonly known as:  PROTONIX  Take 1 tablet (40 mg total) by mouth 2 (two) times daily.     simvastatin 40 MG tablet  Commonly known as:  ZOCOR  Take 40 mg by mouth daily.     topiramate 25 MG tablet  Commonly known as:  TOPAMAX  Take 25 mg by mouth daily.         Discharge Condition: *  Discharge Instructions       Discharge Instructions    Diet - low sodium heart healthy    Complete by:  As directed      Increase activity slowly    Complete by:  As directed            No Known Allergies    Disposition: 01-Home or Self Care   Consults: None    Significant Diagnostic Studies:  Dg Chest Portable 1 View  11/28/2015  CLINICAL DATA:  Acute onset of epigastric  abdominal pain, radiating to the chest. Shortness of breath. Initial encounter. EXAM: PORTABLE CHEST 1 VIEW COMPARISON:  Chest radiograph performed 08/23/2014 FINDINGS: The lungs are well-aerated and clear. There is no evidence of focal opacification, pleural effusion or pneumothorax. The cardiomediastinal silhouette is borderline enlarged. No acute osseous abnormalities are seen. IMPRESSION: Borderline cardiomegaly.  Lungs remain grossly clear. Electronically Signed   By: Garald Balding M.D.   On: 11/28/2015 21:19   Ct Angio Chest Aorta W/cm &/or Wo/cm  11/28/2015  CLINICAL DATA:  75 year old female with abdominal pain radiating to the chest. EXAM: CT ANGIOGRAPHY CHEST, ABDOMEN AND PELVIS TECHNIQUE: Multidetector CT imaging through the chest, abdomen and pelvis was performed using the standard protocol during bolus administration of intravenous contrast. Multiplanar reconstructed images and MIPs were obtained and reviewed to evaluate the vascular anatomy. CONTRAST:  40m OMNIPAQUE IOHEXOL 350 MG/ML SOLN COMPARISON:  CT dated 08/23/2014 FINDINGS: CTA CHEST FINDINGS There is mild emphysematous changes of the lungs. Small right upper lobe calcified granuloma. There is a patchy area of nodularity at the right lung base concerning for pneumonia. Areas of ground-glass and hazy airspace opacity noted at the lung  bases as well as the apical region, likely chronic or related to atelectatic changes. There is no focal consolidation, pleural effusion, or pneumothorax. The central airways are patent. There is mild peribronchial thickening predominantly involving the lung bases. Bronchopneumonia is not excluded. There is slight irregularity of the aortic arch with noncalcified plaques. There is no aneurysm or dissection of the thoracic aorta. No CT evidence of pulmonary embolism. There is no cardiomegaly or pericardial effusion. There is no hilar or mediastinal adenopathy. The esophagus is grossly unremarkable. There is focal  calcification of the left thyroid gland. Ultrasound may provide better evaluation. There is no axillary adenopathy. The chest wall soft tissues appear unremarkable. There is osteopenia with degenerative changes of the thoracic spine. No acute fracture. Review of the MIP images confirms the above findings. CTA ABDOMEN AND PELVIS FINDINGS No intra-abdominal free air or free fluid. Cholecystectomy. The liver, pancreas, spleen, adrenal glands appear unremarkable. There is moderate bilateral renal atrophy. There is no hydronephrosis on either side. The visualized ureters and urinary bladder appear unremarkable. The uterus and the ovaries are grossly unremarkable. There is thickening and inflammatory changes of the gastric antrum and pylorus as well as the second portion of the duodenum compatible with gastritis. There is postsurgical changes of right hemicolectomy. A and left lower quadrant ostomy is noted. There is herniation of multiple loops of small bowel along the left lower quadrant ostomy compatible with a parastomal hernia. There is narrowing of the bowel loop at the neck of the hernia. A normal caliber air-filled loop of bowel is seen within the hernia. Mild/low grade obstruction is less likely. There is also a small focal defect in the right anterior pelvic wall with herniation of multiple loops of small bowel without evidence of obstruction. There are postsurgical changes and defects of the anterior abdominal wall similar to prior study. No fluid collection or inflammatory changes identified. There is atherosclerotic calcification of the abdominal aorta. There is no evidence of dissection or aneurysm. The origins of the celiac axis, SMA, IMA as well as the origins of the renal arteries are patent. No portal venous gas identified. There is no adenopathy. There is osteopenia with degenerative changes of the spine. No acute fracture. Review of the MIP images confirms the above findings. IMPRESSION: No evidence of  aortic dissection or aneurysm. No CT evidence of pulmonary embolism. Emphysema. A patchy area of nodularity at the right lung base concerning for pneumonia. Inflammatory changes and thickening of the distal stomach and proximal duodenum concerning for gastroduodenitis. Clinical correlation is recommended. No evidence of bowel obstruction. 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 Postsurgical changes of right hemicolectomy as well as stable appearing postsurgical changes of the anterior abdominal wall. Electronically Signed   By: Anner Crete M.D.   On: 11/28/2015 22:35   Ct Angio Abd/pel W/ And/or W/o  11/28/2015  CLINICAL DATA:  75 year old female with abdominal pain radiating to the chest. EXAM: CT ANGIOGRAPHY CHEST, ABDOMEN AND PELVIS TECHNIQUE: Multidetector CT imaging through the chest, abdomen and pelvis was performed using the standard protocol during bolus administration of intravenous contrast. Multiplanar reconstructed images and MIPs were obtained and reviewed to evaluate the vascular anatomy. CONTRAST:  1m OMNIPAQUE IOHEXOL 350 MG/ML SOLN COMPARISON:  CT dated 08/23/2014 FINDINGS: CTA CHEST FINDINGS There is mild emphysematous changes of the lungs. Small right upper lobe calcified granuloma. There is a patchy area of nodularity at the right lung base concerning for pneumonia. Areas of ground-glass and  hazy airspace opacity noted at the lung bases as well as the apical region, likely chronic or related to atelectatic changes. There is no focal consolidation, pleural effusion, or pneumothorax. The central airways are patent. There is mild peribronchial thickening predominantly involving the lung bases. Bronchopneumonia is not excluded. There is slight irregularity of the aortic arch with noncalcified plaques. There is no aneurysm or dissection of the thoracic aorta. No CT evidence of pulmonary embolism. There is no cardiomegaly or pericardial  effusion. There is no hilar or mediastinal adenopathy. The esophagus is grossly unremarkable. There is focal calcification of the left thyroid gland. Ultrasound may provide better evaluation. There is no axillary adenopathy. The chest wall soft tissues appear unremarkable. There is osteopenia with degenerative changes of the thoracic spine. No acute fracture. Review of the MIP images confirms the above findings. CTA ABDOMEN AND PELVIS FINDINGS No intra-abdominal free air or free fluid. Cholecystectomy. The liver, pancreas, spleen, adrenal glands appear unremarkable. There is moderate bilateral renal atrophy. There is no hydronephrosis on either side. The visualized ureters and urinary bladder appear unremarkable. The uterus and the ovaries are grossly unremarkable. There is thickening and inflammatory changes of the gastric antrum and pylorus as well as the second portion of the duodenum compatible with gastritis. There is postsurgical changes of right hemicolectomy. A and left lower quadrant ostomy is noted. There is herniation of multiple loops of small bowel along the left lower quadrant ostomy compatible with a parastomal hernia. There is narrowing of the bowel loop at the neck of the hernia. A normal caliber air-filled loop of bowel is seen within the hernia. Mild/low grade obstruction is less likely. There is also a small focal defect in the right anterior pelvic wall with herniation of multiple loops of small bowel without evidence of obstruction. There are postsurgical changes and defects of the anterior abdominal wall similar to prior study. No fluid collection or inflammatory changes identified. There is atherosclerotic calcification of the abdominal aorta. There is no evidence of dissection or aneurysm. The origins of the celiac axis, SMA, IMA as well as the origins of the renal arteries are patent. No portal venous gas identified. There is no adenopathy. There is osteopenia with degenerative changes of  the spine. No acute fracture. Review of the MIP images confirms the above findings. IMPRESSION: No evidence of aortic dissection or aneurysm. No CT evidence of pulmonary embolism. Emphysema. A patchy area of nodularity at the right lung base concerning for pneumonia. Inflammatory changes and thickening of the distal stomach and proximal duodenum concerning for gastroduodenitis. Clinical correlation is recommended. No evidence of bowel obstruction. 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 Postsurgical changes of right hemicolectomy as well as stable appearing postsurgical changes of the anterior abdominal wall. Electronically Signed   By: Anner Crete M.D.   On: 11/28/2015 22:35        Filed Weights   11/29/15 0730  Weight: 50 kg (110 lb 3.7 oz)     Microbiology: Recent Results (from the past 240 hour(s))  Urine culture     Status: None   Collection Time: 11/28/15 10:38 PM  Result Value Ref Range Status   Specimen Description URINE, CATHETERIZED  Final   Special Requests NONE  Final   Culture NO GROWTH 2 DAYS  Final   Report Status 11/30/2015 FINAL  Final       Blood Culture    Component Value Date/Time   SDES URINE, CATHETERIZED 11/28/2015 2238  SPECREQUEST NONE 11/28/2015 2238   CULT NO GROWTH 2 DAYS 11/28/2015 2238   REPTSTATUS 11/30/2015 FINAL 11/28/2015 2238      Labs: Results for orders placed or performed during the hospital encounter of 11/28/15 (from the past 48 hour(s))  Basic metabolic panel     Status: Abnormal   Collection Time: 11/28/15  8:40 PM  Result Value Ref Range   Sodium 132 (L) 135 - 145 mmol/L   Potassium 5.4 (H) 3.5 - 5.1 mmol/L   Chloride 107 101 - 111 mmol/L   CO2 16 (L) 22 - 32 mmol/L   Glucose, Bld 116 (H) 65 - 99 mg/dL   BUN 20 6 - 20 mg/dL   Creatinine, Ser 1.54 (H) 0.44 - 1.00 mg/dL   Calcium 9.0 8.9 - 10.3 mg/dL   GFR calc non Af Amer 32 (L) >60 mL/min   GFR calc Af Amer 37 (L) >60  mL/min    Comment: (NOTE) The eGFR has been calculated using the CKD EPI equation. This calculation has not been validated in all clinical situations. eGFR's persistently <60 mL/min signify possible Chronic Kidney Disease.    Anion gap 9 5 - 15  CBC     Status: None   Collection Time: 11/28/15  8:40 PM  Result Value Ref Range   WBC 9.9 4.0 - 10.5 K/uL   RBC 4.57 3.87 - 5.11 MIL/uL   Hemoglobin 12.5 12.0 - 15.0 g/dL   HCT 38.9 36.0 - 46.0 %   MCV 85.1 78.0 - 100.0 fL   MCH 27.4 26.0 - 34.0 pg   MCHC 32.1 30.0 - 36.0 g/dL   RDW 13.1 11.5 - 15.5 %   Platelets 217 150 - 400 K/uL  Lipase, blood     Status: None   Collection Time: 11/28/15  8:40 PM  Result Value Ref Range   Lipase 45 11 - 51 U/L  Hepatic function panel     Status: Abnormal   Collection Time: 11/28/15  8:40 PM  Result Value Ref Range   Total Protein 7.9 6.5 - 8.1 g/dL   Albumin 3.8 3.5 - 5.0 g/dL   AST 40 15 - 41 U/L   ALT 26 14 - 54 U/L   Alkaline Phosphatase 174 (H) 38 - 126 U/L   Total Bilirubin 0.8 0.3 - 1.2 mg/dL   Bilirubin, Direct 0.2 0.1 - 0.5 mg/dL   Indirect Bilirubin 0.6 0.3 - 0.9 mg/dL  I-stat troponin, ED (not at [facility], [facility])     Status: None   Collection Time: 11/28/15  8:44 PM  Result Value Ref Range   Troponin i, poc 0.00 0.00 - 0.08 ng/mL   Comment 3            Comment: Due to the release kinetics of cTnI, a negative result within the first hours of the onset of symptoms does not rule out myocardial infarction with certainty. If myocardial infarction is still suspected, repeat the test at appropriate intervals.   Urine culture     Status: None   Collection Time: 11/28/15 10:38 PM  Result Value Ref Range   Specimen Description URINE, CATHETERIZED    Special Requests NONE    Culture NO GROWTH 2 DAYS    Report Status 11/30/2015 FINAL   Urinalysis, Routine w reflex microscopic (not at [facility])     Status: Abnormal   Collection Time: 11/28/15 10:38 PM  Result Value Ref Range   Color, Urine  YELLOW YELLOW   APPearance CLEAR CLEAR  Specific Gravity, Urine 1.017 1.005 - 1.030   pH 5.5 5.0 - 8.0   Glucose, UA NEGATIVE NEGATIVE mg/dL   Hgb urine dipstick TRACE (A) NEGATIVE   Bilirubin Urine NEGATIVE NEGATIVE   Ketones, ur NEGATIVE NEGATIVE mg/dL   Protein, ur NEGATIVE NEGATIVE mg/dL   Nitrite NEGATIVE NEGATIVE   Leukocytes, UA NEGATIVE NEGATIVE  Urine microscopic-add on     Status: Abnormal   Collection Time: 11/28/15 10:38 PM  Result Value Ref Range   Squamous Epithelial / LPF 0-5 (A) NONE SEEN   WBC, UA 0-5 0 - 5 WBC/hpf   RBC / HPF 0-5 0 - 5 RBC/hpf   Bacteria, UA RARE (A) NONE SEEN   Casts HYALINE CASTS (A) NEGATIVE  CBC     Status: Abnormal   Collection Time: 11/29/15  4:01 AM  Result Value Ref Range   WBC 12.4 (H) 4.0 - 10.5 K/uL   RBC 4.41 3.87 - 5.11 MIL/uL   Hemoglobin 12.0 12.0 - 15.0 g/dL   HCT 37.3 36.0 - 46.0 %   MCV 84.6 78.0 - 100.0 fL   MCH 27.2 26.0 - 34.0 pg   MCHC 32.2 30.0 - 36.0 g/dL   RDW 13.1 11.5 - 15.5 %   Platelets 182 150 - 400 K/uL  Comprehensive metabolic panel     Status: Abnormal   Collection Time: 11/29/15  4:01 AM  Result Value Ref Range   Sodium 135 135 - 145 mmol/L   Potassium 4.1 3.5 - 5.1 mmol/L    Comment: DELTA CHECK NOTED   Chloride 108 101 - 111 mmol/L   CO2 20 (L) 22 - 32 mmol/L   Glucose, Bld 114 (H) 65 - 99 mg/dL   BUN 17 6 - 20 mg/dL   Creatinine, Ser 1.28 (H) 0.44 - 1.00 mg/dL   Calcium 8.8 (L) 8.9 - 10.3 mg/dL   Total Protein 6.7 6.5 - 8.1 g/dL   Albumin 3.2 (L) 3.5 - 5.0 g/dL   AST 108 (H) 15 - 41 U/L   ALT 54 14 - 54 U/L   Alkaline Phosphatase 167 (H) 38 - 126 U/L   Total Bilirubin 1.3 (H) 0.3 - 1.2 mg/dL   GFR calc non Af Amer 40 (L) >60 mL/min   GFR calc Af Amer 47 (L) >60 mL/min    Comment: (NOTE) The eGFR has been calculated using the CKD EPI equation. This calculation has not been validated in all clinical situations. eGFR's persistently <60 mL/min signify possible Chronic Kidney Disease.    Anion  gap 7 5 - 15  Troponin I     Status: None   Collection Time: 11/29/15  2:41 PM  Result Value Ref Range   Troponin I <0.03 <0.031 ng/mL    Comment:        NO INDICATION OF MYOCARDIAL INJURY.   Troponin I     Status: None   Collection Time: 11/29/15  7:42 PM  Result Value Ref Range   Troponin I 0.03 <0.031 ng/mL    Comment:        NO INDICATION OF MYOCARDIAL INJURY.   Comprehensive metabolic panel     Status: Abnormal   Collection Time: 11/30/15  5:31 AM  Result Value Ref Range   Sodium 139 135 - 145 mmol/L   Potassium 4.1 3.5 - 5.1 mmol/L   Chloride 113 (H) 101 - 111 mmol/L   CO2 21 (L) 22 - 32 mmol/L   Glucose, Bld 94 65 - 99 mg/dL  BUN 13 6 - 20 mg/dL   Creatinine, Ser 1.46 (H) 0.44 - 1.00 mg/dL   Calcium 7.9 (L) 8.9 - 10.3 mg/dL   Total Protein 5.5 (L) 6.5 - 8.1 g/dL   Albumin 2.6 (L) 3.5 - 5.0 g/dL   AST 36 15 - 41 U/L   ALT 31 14 - 54 U/L   Alkaline Phosphatase 120 38 - 126 U/L   Total Bilirubin 0.7 0.3 - 1.2 mg/dL   GFR calc non Af Amer 34 (L) >60 mL/min   GFR calc Af Amer 40 (L) >60 mL/min    Comment: (NOTE) The eGFR has been calculated using the CKD EPI equation. This calculation has not been validated in all clinical situations. eGFR's persistently <60 mL/min signify possible Chronic Kidney Disease.    Anion gap 5 5 - 15  CBC     Status: Abnormal   Collection Time: 11/30/15  5:31 AM  Result Value Ref Range   WBC 7.8 4.0 - 10.5 K/uL   RBC 3.58 (L) 3.87 - 5.11 MIL/uL   Hemoglobin 9.8 (L) 12.0 - 15.0 g/dL   HCT 30.6 (L) 36.0 - 46.0 %   MCV 85.5 78.0 - 100.0 fL   MCH 27.4 26.0 - 34.0 pg   MCHC 32.0 30.0 - 36.0 g/dL   RDW 13.6 11.5 - 15.5 %   Platelets 154 150 - 400 K/uL  Lipid panel     Status: Abnormal   Collection Time: 11/30/15  5:31 AM  Result Value Ref Range   Cholesterol 140 0 - 200 mg/dL   Triglycerides 157 (H) <150 mg/dL   HDL 29 (L) >40 mg/dL   Total CHOL/HDL Ratio 4.8 RATIO   VLDL 31 0 - 40 mg/dL   LDL Cholesterol 80 0 - 99 mg/dL     Comment:        Total Cholesterol/HDL:CHD Risk Coronary Heart Disease Risk Table                     Men   Women  1/2 Average Risk   3.4   3.3  Average Risk       5.0   4.4  2 X Average Risk   9.6   7.1  3 X Average Risk  23.4   11.0        Use the calculated Patient Ratio above and the CHD Risk Table to determine the patient's CHD Risk.        ATP III CLASSIFICATION (LDL):  <100     mg/dL   Optimal  100-129  mg/dL   Near or Above                    Optimal  130-159  mg/dL   Borderline  160-189  mg/dL   High  >190     mg/dL   Very High      Lipid Panel     Component Value Date/Time   CHOL 140 11/30/2015 0531   TRIG 157* 11/30/2015 0531   HDL 29* 11/30/2015 0531   CHOLHDL 4.8 11/30/2015 0531   VLDL 31 11/30/2015 0531   LDLCALC 80 11/30/2015 0531     Lab Results  Component Value Date   HGBA1C * 06/24/2010    5.7 (NOTE)  According to the ADA Clinical Practice Recommendations for 2011, when HbA1c is used as a screening test:   >=6.5%   Diagnostic of Diabetes Mellitus           (if abnormal result  is confirmed)  5.7-6.4%   Increased risk of developing Diabetes Mellitus  References:Diagnosis and Classification of Diabetes Mellitus,Diabetes AUQJ,3354,56(YBWLS 1):S62-S69 and Standards of Medical Care in         Diabetes - 2011,Diabetes LHTD,4287,68  (Suppl 1):S11-S61.     Lab Results  Component Value Date   LDLCALC 80 11/30/2015   CREATININE 1.46* 11/30/2015    74 y.o. female with a past medical history significant for HTN, CAD s/p NSTEMI in 2011, CKD, and perforated duodenum during ERCP in 2011 requiring partial bowel resection and ostomy who presents with abdominal pain and vomiting for one day. In the ED, the patient was afebrile, hemodynamically stable, hypertensive, and saturating well on room air. She had hyperkalemia and AKI. A CT of the abdomen and pelvis with contrast showed thickening of the  distal stomach and proximal duodenum consistent with gastroenteritis, and without any evidence of obstruction with transition point, or identifiable abnormality at her stoma  Assessment and plan 1. AKI: improving 1.54>1.28 This is new. Bland urine sediment. Likely functional from volume loss with diarrhea, emesis.  Improved with IVF , encouraged PO intake , patient tolerating soft diet      2. Viral gastroenteritis:  resolved with Fluids and supportive cares with ondansetron and analgesics  3. HTN and history of CAD: NSTEMI August 18-21 with LHC with CTO RCA, 60% LAD, 80% LCx and EF at that time of 45-50% EF treated medically. Cardiology consulted to review her ECG,Lost to f/u   Troponin negative this admission Hypertensive at admission. Not on antihypertensives at this time. Labetalol PRN for BP > 180/110 overnight. Dc HCTZ give AKI and electrolyte abnormalities  Will set up follow up with cards   4. Hyperkalemia:  Due to AKI -Fluid resuscitation and trend K  5. Ostomy:  No evidence of obstruction on CT. -Consult to [place]  6. Alkaline phosphatase elevation: Unclear etiology. CT negative. Trend LFT     Discharge Exam:    Blood pressure 120/52, pulse 70, temperature 99.7 F (37.6 C), temperature source Oral, resp. rate 18, height 4' 9"  (1.448 m), weight 50 kg (110 lb 3.7 oz), SpO2 93 %.   General appearance: Frail appearing elderly adult female, alert and in moderate distress from pain and nausea.  Eyes: Anicteric, conjunctiva pink, lids and lashes normal.  ENT: No nasal deformity, discharge, or epistaxis. OP moist without lesions. Edentulous. Lymph: No cervical, supraclavicular lymphadenopathy. Skin: Warm and dry.  Cardiac: RRR, nl S1-S2, no murmurs appreciated. Respiratory: Normal respiratory rate and rhythm. CTAB without rales or wheezes. Abdomen: Abdomen soft without rigidity. No focal TTP.Ostomy with soft stool, no frank blood. No ascites,  distension.  Neuro: Sensorium intact and responding to questions, attention normal. Speech seems fluent. Moves all extremities equally and with normal coordination.  Psych: Behavior appropriate   Follow-up Information    Follow up with GARBA,LAWAL, MD. Schedule an appointment as soon as possible for a visit in 3 days.   Specialty:  Internal Medicine   Contact information:   [place] [place] [address] [phone]       Signed: Reyne Dumas 11/30/2015, 12:43 PM        Time spent >45 mins

## 2015-11-30 NOTE — Progress Notes (Signed)
Patient was discharged home by MD order; discharged instructions review and give to patient and her daughter with care notes; IV D/C; patient will be escorted to the car by nurse tech via wheelchair.

## 2015-12-03 ENCOUNTER — Emergency Department (HOSPITAL_COMMUNITY)
Admission: EM | Admit: 2015-12-03 | Discharge: 2015-12-04 | Disposition: A | Discharge status: Home / Self Care | Payer: Medicaid Other | Attending: Emergency Medicine | Admitting: Emergency Medicine

## 2015-12-03 ENCOUNTER — Encounter (HOSPITAL_COMMUNITY): Payer: Self-pay | Admitting: *Deleted

## 2015-12-03 DIAGNOSIS — Z933 Colostomy status: Secondary | ICD-10-CM | POA: Diagnosis not present

## 2015-12-03 DIAGNOSIS — Z9049 Acquired absence of other specified parts of digestive tract: Secondary | ICD-10-CM | POA: Diagnosis not present

## 2015-12-03 DIAGNOSIS — Z9889 Other specified postprocedural states: Secondary | ICD-10-CM | POA: Insufficient documentation

## 2015-12-03 DIAGNOSIS — J45909 Unspecified asthma, uncomplicated: Secondary | ICD-10-CM | POA: Diagnosis not present

## 2015-12-03 DIAGNOSIS — I252 Old myocardial infarction: Secondary | ICD-10-CM | POA: Insufficient documentation

## 2015-12-03 DIAGNOSIS — Z79899 Other long term (current) drug therapy: Secondary | ICD-10-CM | POA: Insufficient documentation

## 2015-12-03 DIAGNOSIS — Z7951 Long term (current) use of inhaled steroids: Secondary | ICD-10-CM | POA: Insufficient documentation

## 2015-12-03 DIAGNOSIS — R1013 Epigastric pain: Secondary | ICD-10-CM | POA: Insufficient documentation

## 2015-12-03 DIAGNOSIS — Z8719 Personal history of other diseases of the digestive system: Secondary | ICD-10-CM | POA: Diagnosis not present

## 2015-12-03 LAB — COMPREHENSIVE METABOLIC PANEL
ALBUMIN: 3.2 g/dL — AB (ref 3.5–5.0)
ALT: 20 U/L (ref 14–54)
AST: 23 U/L (ref 15–41)
Alkaline Phosphatase: 117 U/L (ref 38–126)
Anion gap: 8 (ref 5–15)
BUN: 8 mg/dL (ref 6–20)
CHLORIDE: 111 mmol/L (ref 101–111)
CO2: 22 mmol/L (ref 22–32)
Calcium: 8.7 mg/dL — ABNORMAL LOW (ref 8.9–10.3)
Creatinine, Ser: 1.23 mg/dL — ABNORMAL HIGH (ref 0.44–1.00)
GFR calc Af Amer: 49 mL/min — ABNORMAL LOW (ref >60)
GFR, EST NON AFRICAN AMERICAN: 42 mL/min — AB (ref >60)
Glucose, Bld: 134 mg/dL — ABNORMAL HIGH (ref 65–99)
POTASSIUM: 3.5 mmol/L (ref 3.5–5.1)
Sodium: 141 mmol/L (ref 135–145)
Total Bilirubin: 0.6 mg/dL (ref 0.3–1.2)
Total Protein: 6.5 g/dL (ref 6.5–8.1)

## 2015-12-03 LAB — CBC
HEMATOCRIT: 33 % — AB (ref 36.0–46.0)
Hemoglobin: 10.3 g/dL — ABNORMAL LOW (ref 12.0–15.0)
MCH: 26.5 pg (ref 26.0–34.0)
MCHC: 31.2 g/dL (ref 30.0–36.0)
MCV: 85.1 fL (ref 78.0–100.0)
Platelets: 221 10*3/uL (ref 150–400)
RBC: 3.88 MIL/uL (ref 3.87–5.11)
RDW: 13.7 % (ref 11.5–15.5)
WBC: 6.1 10*3/uL (ref 4.0–10.5)

## 2015-12-03 LAB — URINALYSIS, ROUTINE W REFLEX MICROSCOPIC
Bilirubin Urine: NEGATIVE
GLUCOSE, UA: NEGATIVE mg/dL
Ketones, ur: NEGATIVE mg/dL
Nitrite: NEGATIVE
PH: 5.5 (ref 5.0–8.0)
Protein, ur: NEGATIVE mg/dL
SPECIFIC GRAVITY, URINE: 1.015 (ref 1.005–1.030)

## 2015-12-03 LAB — URINE MICROSCOPIC-ADD ON

## 2015-12-03 LAB — LIPASE, BLOOD: LIPASE: 64 U/L — AB (ref 11–51)

## 2015-12-03 MED ORDER — RANITIDINE HCL 150 MG/10ML PO SYRP
300.0000 mg | ORAL_SOLUTION | Freq: Once | ORAL | Status: AC
  Administered 2015-12-04: 300 mg via ORAL
  Filled 2015-12-03: qty 20

## 2015-12-03 MED ORDER — ALUM & MAG HYDROXIDE-SIMETH 200-200-20 MG/5ML PO SUSP
30.0000 mL | Freq: Once | ORAL | Status: AC
  Administered 2015-12-04: 30 mL via ORAL
  Filled 2015-12-03: qty 30

## 2015-12-03 MED ORDER — SUCRALFATE 1 GM/10ML PO SUSP
1.0000 g | Freq: Once | ORAL | Status: AC
  Administered 2015-12-04: 1 g via ORAL
  Filled 2015-12-03: qty 10

## 2015-12-03 NOTE — ED Notes (Signed)
Pt here for abdominal pain.  Pt had same pain last week and was admitted to the hospital for 3 days, per family the source of the pain was not found.  Pt went home on 1/21 and pain continued and today it is worse.  Pt has been having nausea and dry heaves.  Pt has been able to hold fluids down.  Last BM was today in colostomy.  No fever or chills, no urinary symptoms

## 2015-12-03 NOTE — ED Provider Notes (Signed)
CSN: [id number]     Arrival date & time 12/03/15  2026 History  By signing my name below, I, [name], attest that this documentation has been prepared under the direction and in the presence of Tomasita Crumble, MD. Electronically Signed: [name], ED Scribe. 12/04/2015. 12:03 AM   Chief Complaint  Patient presents with  . Abdominal Pain   The history is provided by the patient. A language interpreter was used [place] ).   [name] is a 75 y.o. female with PMHx of NSTEMI, paroxysmal a-fib, asthma, and gastric ulcer with bleeding who presents to the Emergency Department with her daughter-in-law complaining of constant, burning/aching, 9/10 in severity, mid-epigastric abdominal pain with onset earlier today. The pain is present regardless of eating. A warm compress provided insufficient relief in pain at home and caused some redness at the affected area. Associated symptoms include nausea. Pt denies vomiting, diarrhea (her stools have been normal), and fever.   Past Medical History  Diagnosis Date  . Asthma   . NSTEMI (non-ST elevated myocardial infarction) (HCC) 2011    medically managed  . Paroxysmal atrial fibrillation (HCC)     Hx of amiodarone use  . Acute gastric ulcer with bleeding 11/09/2013   Past Surgical History  Procedure Laterality Date  . Colostomy Left 2011    Dr. Carolynne Edouard  . Partial colectomy  2011    necrotic bowel after lap chole  . Laparoscopic cholecystectomy  2011  . Ercp  2011  . Cardiac catheterization  2011    100% occlusion RCA, 80% stenosis circumflex, 60% stenosis LAD  . Esophagogastroduodenoscopy N/A 11/08/2013    Procedure: ESOPHAGOGASTRODUODENOSCOPY (EGD);  Surgeon: Theda Belfast, MD;  Location: [facility] ENDOSCOPY;  Service: Endoscopy;  Laterality: N/A;  . Esophagogastroduodenoscopy N/A 08/28/2014    Procedure: ESOPHAGOGASTRODUODENOSCOPY (EGD);  Surgeon: Barrie Folk, MD;  Location: [facility] ENDOSCOPY;  Service: Endoscopy;  Laterality: N/A;   No family  history on file. Social History  Substance Use Topics  . Smoking status: Never Smoker   . Smokeless tobacco: Never Used  . Alcohol Use: No   OB History    No data available     Review of Systems 10 Systems reviewed and all are negative for acute change except as noted in the HPI.  Allergies  Review of patient's allergies indicates no known allergies.  Home Medications   Prior to Admission medications   Medication Sig Start Date End Date Taking? Authorizing Provider  albuterol (PROVENTIL HFA;VENTOLIN HFA) 108 (90 BASE) MCG/ACT inhaler Inhale 1 puff into the lungs every 6 (six) hours as needed for wheezing or shortness of breath.    Historical Provider, MD  cetirizine (ZYRTEC) 10 MG tablet Take 10 mg by mouth daily.    Historical Provider, MD  Fluticasone-Salmeterol (ADVAIR) 250-50 MCG/DOSE AEPB Inhale 1 puff into the lungs 2 (two) times daily.    Historical Provider, MD  pantoprazole (PROTONIX) 40 MG tablet Take 1 tablet (40 mg total) by mouth 2 (two) times daily. Patient not taking: Reported on 11/29/2015 08/30/14   [name], MD  simvastatin (ZOCOR) 40 MG tablet Take 40 mg by mouth daily.    Historical Provider, MD  topiramate (TOPAMAX) 25 MG tablet Take 25 mg by mouth daily.    Historical Provider, MD   BP 161/71 mmHg  Pulse 62  Temp(Src) 97.7 F (36.5 C) (Oral)  Resp 18  SpO2 99% Physical Exam  Constitutional: She is oriented to person, place, and time. She appears well-developed  and well-nourished. No distress.  HENT:  Head: Normocephalic and atraumatic.  Nose: Nose normal.  Mouth/Throat: Oropharynx is clear and moist. No oropharyngeal exudate.  Eyes: Conjunctivae and EOM are normal. Pupils are equal, round, and reactive to light. No scleral icterus.  Neck: Normal range of motion. Neck supple. No JVD present. No tracheal deviation present. No thyromegaly present.  Cardiovascular: Normal rate, regular rhythm and normal heart sounds.  Exam reveals no gallop and no  friction rub.   No murmur heard. Pulmonary/Chest: Effort normal and breath sounds normal. No respiratory distress. She has no wheezes. She exhibits no tenderness.  Abdominal: Soft. Bowel sounds are normal. She exhibits no distension and no mass. There is no tenderness. There is no rebound and no guarding.  Colostomy bag noted in LLQ Erythema in mid-epigastric area  Musculoskeletal: Normal range of motion. She exhibits no edema or tenderness.  Lymphadenopathy:    She has no cervical adenopathy.  Neurological: She is alert and oriented to person, place, and time. No cranial nerve deficit. She exhibits normal muscle tone.  Skin: Skin is warm and dry. No rash noted. No erythema. No pallor.  Nursing note and vitals reviewed.   ED Course  Procedures (including critical care time) DIAGNOSTIC STUDIES: Oxygen Saturation is 99% on RA,  normal by my interpretation.    COORDINATION OF CARE: 11:36 PM Discussed treatment plan which includes lab work, EKG, Zantac, Maalox/Mylanta, and Carafate with pt at bedside and pt agreed to plan.  Labs Review Labs Reviewed  LIPASE, BLOOD - Abnormal; Notable for the following:    Lipase 64 (*)    All other components within normal limits  COMPREHENSIVE METABOLIC PANEL - Abnormal; Notable for the following:    Glucose, Bld 134 (*)    Creatinine, Ser 1.23 (*)    Calcium 8.7 (*)    Albumin 3.2 (*)    GFR calc non Af Amer 42 (*)    GFR calc Af Amer 49 (*)    All other components within normal limits  CBC - Abnormal; Notable for the following:    Hemoglobin 10.3 (*)    HCT 33.0 (*)    All other components within normal limits  URINALYSIS, ROUTINE W REFLEX MICROSCOPIC (NOT AT [facility]) - Abnormal; Notable for the following:    APPearance CLOUDY (*)    Hgb urine dipstick TRACE (*)    Leukocytes, UA SMALL (*)    All other components within normal limits  URINE MICROSCOPIC-ADD ON - Abnormal; Notable for the following:    Squamous Epithelial / LPF 0-5 (*)     Bacteria, UA FEW (*)    Casts HYALINE CASTS (*)    All other components within normal limits  I-STAT CG4 LACTIC ACID, ED    Imaging Review No results found. I have personally reviewed and evaluated these lab results as part of my medical decision-making.   EKG Interpretation None      MDM   Final diagnoses:  None   patient presents to the emergency department for abdominal pain. She was here last week for the same. Her history is consistent with gastritis. She was given ranitidine, Maalox, sucralfate for treatment. Upon my repeat evaluation she states the pain is completely gone. She'll be placed on Zantac at home to take daily.  She had a recent CTA of her chest abdomen and pelvis last week which did not show any acute cause of her pain. She currently appears well, resting comfortably in the bed. Vital signs within  her normal limits and she is safe for discharge.    I personally performed the services described in this documentation, which was scribed in my presence. The recorded information has been reviewed and is accurate.     Tomasita Crumble, MD 12/04/15 [phone]

## 2015-12-04 LAB — I-STAT CG4 LACTIC ACID, ED: LACTIC ACID, VENOUS: 1.03 mmol/L (ref 0.5–2.0)

## 2015-12-04 MED ORDER — RANITIDINE HCL 150 MG PO CAPS: 150.0000 mg | ORAL_CAPSULE | Freq: Every day | ORAL | Status: DC

## 2015-12-04 NOTE — Discharge Instructions (Signed)
Abdominal Pain, Adult [name], take RANITIDINE every day to help with your abdominal pain.  See your primary care doctor within 3 days for close follow up.  If symptoms worsen, come back to the ED immediately.  Thank you.  ?????? Sidener, RANITIDINE ???? ??? ????? ??? ????? ????? ???? ????????3 ??? ????? ????? ???????? ???????? ?????? ?????????? ???? ??? ?????????? worsen ? ???, ?? ???? ???????? ???????????????     Many things can cause belly (abdominal) pain. Most times, the belly pain is not dangerous. Many cases of belly pain can be watched and treated at home. HOME CARE   Do not take medicines that help you go poop (laxatives) unless told to by your doctor.  Only take medicine as told by your doctor.  Eat or drink as told by your doctor. Your doctor will tell you if you should be on a special diet. GET HELP IF:  You do not know what is causing your belly pain.  You have belly pain while you are sick to your stomach (nauseous) or have runny poop (diarrhea).  You have pain while you pee or poop.  Your belly pain wakes you up at night.  You have belly pain that gets worse or better when you eat.  You have belly pain that gets worse when you eat fatty foods.  You have a fever. GET HELP RIGHT AWAY IF:   The pain does not go away within 2 hours.  You keep throwing up (vomiting).  The pain changes and is only in the right or left part of the belly.  You have bloody or tarry looking poop. MAKE SURE YOU:   Understand these instructions.  Will watch your condition.  Will get help right away if you are not doing well or get worse.   This information is not intended to replace advice given to you by your health care provider. Make sure you discuss any questions you have with your health care provider.   Document Released: 04/13/2008 Document Revised: 11/16/2014 Document Reviewed: 07/05/2013 Elsevier Interactive Patient Education [employer]

## 2016-09-01 ENCOUNTER — Inpatient Hospital Stay (HOSPITAL_COMMUNITY): Payer: Medicare Other

## 2016-09-01 ENCOUNTER — Inpatient Hospital Stay (HOSPITAL_COMMUNITY)
Admission: EM | Admit: 2016-09-01 | Discharge: 2016-09-05 | DRG: 390 | Disposition: A | Discharge status: Home / Self Care | Payer: Medicare Other | Attending: Emergency Medicine | Admitting: Emergency Medicine

## 2016-09-01 ENCOUNTER — Encounter (HOSPITAL_COMMUNITY): Payer: Self-pay | Admitting: *Deleted

## 2016-09-01 ENCOUNTER — Emergency Department (HOSPITAL_COMMUNITY): Payer: Medicare Other

## 2016-09-01 DIAGNOSIS — Z932 Ileostomy status: Secondary | ICD-10-CM | POA: Diagnosis not present

## 2016-09-01 DIAGNOSIS — I480 Paroxysmal atrial fibrillation: Secondary | ICD-10-CM | POA: Diagnosis present

## 2016-09-01 DIAGNOSIS — K56600 Partial intestinal obstruction, unspecified as to cause: Secondary | ICD-10-CM | POA: Diagnosis present

## 2016-09-01 DIAGNOSIS — Z79899 Other long term (current) drug therapy: Secondary | ICD-10-CM

## 2016-09-01 DIAGNOSIS — N289 Disorder of kidney and ureter, unspecified: Secondary | ICD-10-CM | POA: Diagnosis present

## 2016-09-01 DIAGNOSIS — I2510 Atherosclerotic heart disease of native coronary artery without angina pectoris: Secondary | ICD-10-CM | POA: Diagnosis present

## 2016-09-01 DIAGNOSIS — K460 Unspecified abdominal hernia with obstruction, without gangrene: Secondary | ICD-10-CM

## 2016-09-01 DIAGNOSIS — Z0189 Encounter for other specified special examinations: Secondary | ICD-10-CM

## 2016-09-01 DIAGNOSIS — I10 Essential (primary) hypertension: Secondary | ICD-10-CM | POA: Diagnosis present

## 2016-09-01 DIAGNOSIS — R109 Unspecified abdominal pain: Secondary | ICD-10-CM

## 2016-09-01 DIAGNOSIS — K432 Incisional hernia without obstruction or gangrene: Secondary | ICD-10-CM | POA: Diagnosis present

## 2016-09-01 DIAGNOSIS — I255 Ischemic cardiomyopathy: Secondary | ICD-10-CM | POA: Diagnosis present

## 2016-09-01 DIAGNOSIS — K56609 Unspecified intestinal obstruction, unspecified as to partial versus complete obstruction: Secondary | ICD-10-CM

## 2016-09-01 DIAGNOSIS — D649 Anemia, unspecified: Secondary | ICD-10-CM | POA: Diagnosis present

## 2016-09-01 DIAGNOSIS — I252 Old myocardial infarction: Secondary | ICD-10-CM

## 2016-09-01 LAB — CBC WITH DIFFERENTIAL/PLATELET
Basophils Absolute: 0 10*3/uL (ref 0.0–0.1)
Basophils Relative: 0 %
Eosinophils Absolute: 0.2 10*3/uL (ref 0.0–0.7)
Eosinophils Relative: 2 %
HCT: 36.3 % (ref 36.0–46.0)
Hemoglobin: 11.6 g/dL — ABNORMAL LOW (ref 12.0–15.0)
Lymphocytes Relative: 17 %
Lymphs Abs: 1.5 10*3/uL (ref 0.7–4.0)
MCH: 27.2 pg (ref 26.0–34.0)
MCHC: 32 g/dL (ref 30.0–36.0)
MCV: 85 fL (ref 78.0–100.0)
Monocytes Absolute: 0.6 10*3/uL (ref 0.1–1.0)
Monocytes Relative: 6 %
Neutro Abs: 6.5 10*3/uL (ref 1.7–7.7)
Neutrophils Relative %: 75 %
Platelets: 236 10*3/uL (ref 150–400)
RBC: 4.27 MIL/uL (ref 3.87–5.11)
RDW: 13.4 % (ref 11.5–15.5)
WBC: 8.7 10*3/uL (ref 4.0–10.5)

## 2016-09-01 LAB — COMPREHENSIVE METABOLIC PANEL
ALT: 20 U/L (ref 14–54)
AST: 30 U/L (ref 15–41)
Albumin: 3.5 g/dL (ref 3.5–5.0)
Alkaline Phosphatase: 133 U/L — ABNORMAL HIGH (ref 38–126)
Anion gap: 8 (ref 5–15)
BUN: 13 mg/dL (ref 6–20)
CO2: 21 mmol/L — ABNORMAL LOW (ref 22–32)
Calcium: 8.7 mg/dL — ABNORMAL LOW (ref 8.9–10.3)
Chloride: 106 mmol/L (ref 101–111)
Creatinine, Ser: 1.33 mg/dL — ABNORMAL HIGH (ref 0.44–1.00)
GFR calc Af Amer: 45 mL/min — ABNORMAL LOW (ref >60)
GFR calc non Af Amer: 39 mL/min — ABNORMAL LOW (ref >60)
Glucose, Bld: 121 mg/dL — ABNORMAL HIGH (ref 65–99)
Potassium: 4.3 mmol/L (ref 3.5–5.1)
Sodium: 135 mmol/L (ref 135–145)
Total Bilirubin: 0.9 mg/dL (ref 0.3–1.2)
Total Protein: 7.4 g/dL (ref 6.5–8.1)

## 2016-09-01 LAB — URINALYSIS, ROUTINE W REFLEX MICROSCOPIC
Bilirubin Urine: NEGATIVE
Glucose, UA: NEGATIVE mg/dL
Hgb urine dipstick: NEGATIVE
Ketones, ur: NEGATIVE mg/dL
Leukocytes, UA: NEGATIVE
Nitrite: NEGATIVE
Protein, ur: NEGATIVE mg/dL
Specific Gravity, Urine: 1.004 — ABNORMAL LOW (ref 1.005–1.030)
pH: 5.5 (ref 5.0–8.0)

## 2016-09-01 LAB — I-STAT TROPONIN, ED: Troponin i, poc: 0 ng/mL (ref 0.00–0.08)

## 2016-09-01 LAB — LIPASE, BLOOD: Lipase: 47 U/L (ref 11–51)

## 2016-09-01 MED ORDER — ONDANSETRON HCL 4 MG/2ML IJ SOLN
4.0000 mg | Freq: Four times a day (QID) | INTRAMUSCULAR | Status: DC | PRN
Start: 2016-09-01 — End: 2016-09-05

## 2016-09-01 MED ORDER — MORPHINE SULFATE (PF) 4 MG/ML IV SOLN
2.0000 mg | Freq: Once | INTRAVENOUS | Status: AC
  Administered 2016-09-01: 2 mg via INTRAVENOUS
  Filled 2016-09-01: qty 1

## 2016-09-01 MED ORDER — IOPAMIDOL (ISOVUE-300) INJECTION 61%
INTRAVENOUS | Status: AC
  Filled 2016-09-01: qty 30

## 2016-09-01 MED ORDER — FAMOTIDINE IN NACL 20-0.9 MG/50ML-% IV SOLN
20.0000 mg | Freq: Two times a day (BID) | INTRAVENOUS | Status: DC
Start: 2016-09-01 — End: 2016-09-03
  Administered 2016-09-01 – 2016-09-03 (×5): 20 mg via INTRAVENOUS
  Filled 2016-09-01 (×6): qty 50

## 2016-09-01 MED ORDER — HEPARIN SODIUM (PORCINE) 5000 UNIT/ML IJ SOLN
5000.0000 [IU] | Freq: Three times a day (TID) | INTRAMUSCULAR | Status: DC
  Administered 2016-09-01 – 2016-09-05 (×11): 5000 [IU] via SUBCUTANEOUS
  Filled 2016-09-01 (×12): qty 1

## 2016-09-01 MED ORDER — ONDANSETRON HCL 4 MG/2ML IJ SOLN
4.0000 mg | Freq: Once | INTRAMUSCULAR | Status: AC
  Administered 2016-09-01: 4 mg via INTRAVENOUS
  Filled 2016-09-01: qty 2

## 2016-09-01 MED ORDER — HYDROMORPHONE HCL 2 MG/ML IJ SOLN
0.5000 mg | INTRAMUSCULAR | Status: DC | PRN
  Administered 2016-09-03 (×2): 0.5 mg via INTRAVENOUS
  Filled 2016-09-01 (×2): qty 1

## 2016-09-01 MED ORDER — ONDANSETRON 4 MG PO TBDP: 4.0000 mg | ORAL_TABLET | Freq: Four times a day (QID) | ORAL | Status: DC | PRN

## 2016-09-01 MED ORDER — DIATRIZOATE MEGLUMINE & SODIUM 66-10 % PO SOLN
90.0000 mL | Freq: Once | ORAL | Status: AC
  Administered 2016-09-01: 90 mL via NASOGASTRIC
  Filled 2016-09-01: qty 90

## 2016-09-01 MED ORDER — SODIUM CHLORIDE 0.9 % IV SOLN
INTRAVENOUS | Status: DC
  Administered 2016-09-01 – 2016-09-02 (×4): via INTRAVENOUS

## 2016-09-01 MED ORDER — SODIUM CHLORIDE 0.9 % IV BOLUS (SEPSIS)
1000.0000 mL | Freq: Once | INTRAVENOUS | Status: AC
  Administered 2016-09-01: 1000 mL via INTRAVENOUS

## 2016-09-01 MED ORDER — IOPAMIDOL (ISOVUE-300) INJECTION 61%
INTRAVENOUS | Status: AC
  Administered 2016-09-01: 80 mL
  Filled 2016-09-01: qty 100

## 2016-09-01 MED ORDER — MORPHINE SULFATE (PF) 4 MG/ML IV SOLN
4.0000 mg | Freq: Once | INTRAVENOUS | Status: AC
  Administered 2016-09-01: 4 mg via INTRAVENOUS
  Filled 2016-09-01: qty 1

## 2016-09-01 MED ORDER — SODIUM CHLORIDE 0.9 % IV BOLUS (SEPSIS)
500.0000 mL | Freq: Once | INTRAVENOUS | Status: AC
  Administered 2016-09-01: 500 mL via INTRAVENOUS

## 2016-09-01 NOTE — ED Provider Notes (Signed)
7:51 AM Level V caveat secondary to language barrier. Patient speaks [place]. Initial history obtained through translator at bedside Patient care assumed from Dr. Preston FleetingGlick.  Patient presented complaining of abdominal pain. She has history of multiple small bowel obstructions and has ostomy. He suspects a small bowel obstruction and CT is pending. On my bedside exam patient is resting and appears somewhat uncomfortable but is drinking her contrast. Exam of her abdomen reveals multiple scars on abdomen with empty colostomy bag. She points to the right lower quadrant for tenderness. Abdomen has tender mass palpable in right lower quadrant. Remainder of abdomen soft. 10:21 AM CT reviewed and incarcerated hernia noted.  Surgery paged. Patient remains hemodynamically stable. Vitals:   09/01/16 0945 09/01/16 1000  BP: 106/64 117/71  Pulse: 70 70  Resp: 17 19  Temp:       Principal Problem:   AKI (acute kidney injury) (HCC) Active Problems:   Essential hypertension, benign   Coronary atherosclerosis   Ileostomy status (HCC)   Gastroenteritis   Epigastric pain  Discussed with Will on general surgery and they will be in to see.     Regina Grizzleanielle Qiana Landgrebe, MD 09/01/16 1027

## 2016-09-01 NOTE — Progress Notes (Signed)
Patient arrived from ED alert and oriented accompanied by family. NG tube in L nare, IV site in right AC. Mild pain in abdomen, other than that no complaints.

## 2016-09-01 NOTE — ED Notes (Signed)
Son stated he would be back at 4 this afternoon to pick her up.  Explained if she does get discharged that we cold not hold her in the room until then.  Explained that we usually have people in the waiting room that need to be seen.  He asked if we had a Medicare Taxi and explain that we have taxi's but it is the patients responsibility to pay for it.

## 2016-09-01 NOTE — ED Notes (Signed)
Family at bedside. 

## 2016-09-01 NOTE — Progress Notes (Signed)
Admission nurse notified of new patient.  

## 2016-09-01 NOTE — ED Provider Notes (Signed)
[facility]-EMERGENCY DEPT Provider Note   CSN: [id number] Arrival date & time: 09/01/16  0504     History   Chief Complaint Chief Complaint  Patient presents with  . Abdominal Pain    HPI [name] is a 75 y.o. female.  She has a history of small bowel obstruction, ileostomy, hypertension, ischemic cardiomyopathy. She comes in with three-day history of crampy lower abdominal pain with associated nausea but no vomiting. She continues to have output through her colostomy. There've been no fever, chills, sweats. She has taken over-the-counter medication without relief of pain. Nothing makes pain better nothing makes it worse.   The history is provided by the patient. A language interpreter was used.    Past Medical History:  Diagnosis Date  . Acute gastric ulcer with bleeding 11/09/2013  . Asthma   . NSTEMI (non-ST elevated myocardial infarction) (HCC) 2011   medically managed  . Paroxysmal atrial fibrillation (HCC)    Hx of amiodarone use    Patient Active Problem List   Diagnosis Date Noted  . AKI (acute kidney injury) (HCC) 11/29/2015  . Gastroenteritis 11/29/2015  . Epigastric pain   . Partial small bowel obstruction 08/23/2014  . Acute gastric ulcer with bleeding 11/09/2013  . Acute posthemorrhagic anemia 11/09/2013  . 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 ANEMIA 07/23/2010  . Essential hypertension, benign 07/23/2010  . ISCHEMIC CARDIOMYOPATHY 07/23/2010  . HYPERLIPIDEMIA-MIXED 07/17/2010  . Coronary atherosclerosis 07/17/2010  . Atrial fibrillation (HCC) 07/17/2010  . RENAL INSUFFICIENCY 07/17/2010  . Asthma 07/15/2010    Past Surgical History:  Procedure Laterality Date  . 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 COLOSTOMY Left 2011   Dr. Carolynne Edouardoth  . ERCP  2011  . ESOPHAGOGASTRODUODENOSCOPY N/A 11/08/2013   Procedure: ESOPHAGOGASTRODUODENOSCOPY (EGD);  Surgeon: Theda BelfastPatrick D Hung, MD;  [facility];  Service: Endoscopy;  Laterality: N/A;  . ESOPHAGOGASTRODUODENOSCOPY N/A 08/28/2014   Procedure: ESOPHAGOGASTRODUODENOSCOPY (EGD);  Surgeon: Barrie FolkJohn C Hayes, MD;  [facility];  Service: Endoscopy;  Laterality: N/A;  . LAPAROSCOPIC CHOLECYSTECTOMY  2011  . PARTIAL COLECTOMY  2011   necrotic bowel after lap chole    OB History    No data available       Home Medications    Prior to Admission medications   Medication Sig Start Date End Date Taking? Authorizing Provider  albuterol (PROVENTIL HFA;VENTOLIN HFA) 108 (90 BASE) MCG/ACT inhaler Inhale 1 puff into the lungs every 6 (six) hours as needed for wheezing or shortness of breath.    Historical Provider, MD  cetirizine (ZYRTEC) 10 MG tablet Take 10 mg by mouth daily.    Historical Provider, MD  Fluticasone-Salmeterol (ADVAIR) 250-50 MCG/DOSE AEPB Inhale 1 puff into the lungs 2 (two) times daily.    Historical Provider, MD  ranitidine (ZANTAC) 150 MG capsule Take 1 capsule (150 mg total) by mouth daily. 12/04/15   Tomasita CrumbleAdeleke Oni, MD  simvastatin (ZOCOR) 40 MG tablet Take 40 mg by mouth daily.    Historical Provider, MD  topiramate (TOPAMAX) 25 MG tablet Take 25 mg by mouth daily.    Historical Provider, MD    Family History History reviewed. No pertinent family history.  Social History Social History  Substance Use Topics  . Smoking status: Never Smoker  . Smokeless tobacco: Never Used  . Alcohol use No     Allergies   Review of patient's allergies indicates no known allergies.   Review of  Systems Review of Systems  All other systems reviewed and are negative.    Physical Exam Updated Vital Signs BP 159/95 (BP Location: Right Arm)   Pulse 71   Temp 97.6 F (36.4 C) (Oral)   Resp 20   Wt 110 lb (49.9 kg)   SpO2 100%   BMI 23.80 kg/m   Physical Exam  Nursing note and vitals reviewed.  75 year old female, resting comfortably and in no acute distress. Vital signs are significant for hypertension.  Oxygen saturation is 100%, which is normal. Head is normocephalic and atraumatic. PERRLA, EOMI. Oropharynx is clear. Neck is nontender and supple without adenopathy or JVD. Back is nontender and there is no CVA tenderness. Lungs are clear without rales, wheezes, or rhonchi. Chest is nontender. Heart has regular rate and rhythm without murmur. Abdomen is soft, flat, with moderate to severe tenderness in the right lower abdomen. Multiple surgical scars are present. Colostomy is present in the left lower quadrant. There are no other masses or hepatosplenomegaly and peristalsis is hypoactive. Extremities have no cyanosis or edema, full range of motion is present. Skin is warm and dry without rash. Neurologic: Mental status is normal, cranial nerves are intact, there are no motor or sensory deficits.  ED Treatments / Results  Labs (all labs ordered are listed, but only abnormal results are displayed) Labs Reviewed  COMPREHENSIVE METABOLIC PANEL - Abnormal; Notable for the following:       Result Value   CO2 21 (*)    Glucose, Bld 121 (*)    Creatinine, Ser 1.33 (*)    Calcium 8.7 (*)    Alkaline Phosphatase 133 (*)    GFR calc non Af Amer 39 (*)    GFR calc Af Amer 45 (*)    All other components within normal limits  CBC WITH DIFFERENTIAL/PLATELET - Abnormal; Notable for the following:    Hemoglobin 11.6 (*)    All other components within normal limits  LIPASE, BLOOD  URINALYSIS, ROUTINE W REFLEX MICROSCOPIC (NOT AT [facility])  I-STAT TROPOININ, ED    EKG  EKG Interpretation  Date/Time:  Tuesday September 01 2016 05:33:33 EDT Ventricular Rate:  67 PR Interval:    QRS Duration: 85 QT Interval:  422 QTC Calculation: 446 R Axis:   63 Text Interpretation:  Sinus rhythm ST elevation, consider inferior injury When compared with ECG of 12/03/2015, No significant change was found Confirmed by [facility]  MD, Denzel Etienne ([id number]) on 09/01/2016 5:38:34 AM       Procedures Procedures (including  critical care time)  Medications Ordered in ED Medications  ondansetron (ZOFRAN) injection 4 mg (not administered)  morphine 4 MG/ML injection 4 mg (not administered)     Initial Impression / Assessment and Plan / ED Course  I have reviewed the triage vital signs and the nursing notes.  Pertinent lab results that were available during my care of the patient were reviewed by me and considered in my medical decision making (see chart for details).  Clinical Course   Abdominal pain with marked tenderness in the right lower abdomen. This is very worrisome for partial small bowel obstruction. Old records are reviewed and she has been admitted for partial small bowel obstructions in the past. Last January, CT angiogram showed no evidence of aortic dissection or aneurysm. She's given morphine for pain and will be sent for CT of abdomen and pelvis.  Initial laboratory workup is unremarkable. She has stable renal insufficiency and stable mild anemia. Case is signed  out to Dr. Rosalia Hammers.  Final Clinical Impressions(s) / ED Diagnoses   Final diagnoses:  Abdominal pain, unspecified abdominal location  Renal insufficiency  Normochromic normocytic anemia    New Prescriptions New Prescriptions   No medications on file     Dione Booze, MD 09/02/16 [phone]

## 2016-09-01 NOTE — H&P (Signed)
[name] is an 75 y.o. female.   PCP:  Barbette Merino, MD   Chief Complaint: Nausea and vomiting that started 08/30/16, worse yesterday with nausea and vomiting this a.m.  She has had a hernia since her surgery, says it has gotten larger, but cannot give me a time line for that.     HPI: Complicated lady who underwent duodenal perforation and bleeding after sphincterotomy/ERCP for common bile duct stones. She and underwent exploratory laparotomy lysis of adhesions, kocherization of the duodenum, duodenal ostomy with repair of the duodenal perforation and bleeding opening cholecystectomy common bile duct exploration and placement of T-tube. 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 She was taken back to the operating room on 10/06/2010 at which time she underwent lysis of adhesions, right colectomy and ileostomy. She was hospitalized until 12/23/2010 and ultimately transferred to a skilled nursing facility where they can continue her TNA. She had to remain nothing by mouth secondary to the duodenal cutaneous fistula.She was eventually able to come off TNA, and T-tube was removed.  No current records and Alscript from our office. Patient does not remember the last time she saw Dr. Marlou Starks.  On exam in the ED this AM she is having nausea and vomiting, pain and a large 10 x 10 right lower abdomen hernia, with distension and a hard knot of bowel in it.  She says she has been having stool and emptied bag earlier this AM.   She is afebrile and vital signs are stable. Labs show a creatinine of 1.33 a glucose of 121. Normal troponin, and WBC of 8.7 normal hemoglobin, hematocrit, and platelet count. CT shows a right hemicolectomy, ileostomy in the left lower quadrant with parastomal hernia containing small bowel loops of bowel, interval development of a new hernia right lower quadrant anterior abdominal wall with a loop of incarcerated small bowel that does not appear  to be obstructed obstruction appears to be more proximal in the small bowel. On ED exam we gave her something for nausea and pain. The right lower quadrant abdominal hernia was about 10 x 10 cm. You could feel a hard area of induration with in the hernia. With gentle massage over the next 10-15 minutes this reduced. With cough he returns but is easily reducible. She also has midline ventral hernia but no incarceration. The ileostomy appears intact, there is some fluid and gas in the bag.  Past Medical History:  Diagnosis Date  . Acute gastric ulcer with bleeding 11/09/2013  . Asthma   . NSTEMI (non-ST elevated myocardial infarction) ([place]) 2011   medically managed  . Paroxysmal atrial fibrillation ([facility])    Hx of amiodarone use    Past Surgical History:  Procedure Laterality Date  . 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 COLOSTOMY Left 2011   Dr. Marlou Starks  . ERCP  2011  . ESOPHAGOGASTRODUODENOSCOPY N/A 11/08/2013   Procedure: ESOPHAGOGASTRODUODENOSCOPY (EGD);  Surgeon: Beryle Beams, MD;  Location: [facility] ENDOSCOPY;  Service: Endoscopy;  Laterality: N/A;  . ESOPHAGOGASTRODUODENOSCOPY N/A 08/28/2014   Procedure: ESOPHAGOGASTRODUODENOSCOPY (EGD);  Surgeon: Missy Sabins, MD;  Location: [facility] ENDOSCOPY;  Service: Endoscopy;  Laterality: N/A;  . LAPAROSCOPIC CHOLECYSTECTOMY  2011  . PARTIAL COLECTOMY  2011   necrotic bowel after lap chole    History reviewed. No pertinent family history. Social History:  reports that she has never smoked. She has never used smokeless tobacco. She reports  that she does not drink alcohol or use drugs.  Allergies: No Known Allergies  Prior to Admission medications   Medication Sig Start Date End Date Taking? Authorizing Provider  albuterol (PROVENTIL HFA;VENTOLIN HFA) 108 (90 BASE) MCG/ACT inhaler Inhale 1 puff into the lungs every 6 (six) hours as needed for wheezing or shortness of breath.   Yes Historical  Provider, MD  amLODipine (NORVASC) 5 MG tablet Take 5 mg by mouth daily.   Yes Historical Provider, MD  clopidogrel (PLAVIX) 75 MG tablet Take 75 mg by mouth daily.   Yes Historical Provider, MD  esomeprazole (NEXIUM) 20 MG capsule Take 20 mg by mouth daily at 12 noon.   Yes Historical Provider, MD  metoCLOPramide (REGLAN) 10 MG tablet Take 10 mg by mouth at bedtime.   Yes Historical Provider, MD  metoprolol tartrate (LOPRESSOR) 25 MG tablet Take 12.5 mg by mouth 2 (two) times daily.   Yes Historical Provider, MD  nitroGLYCERIN (NITROSTAT) 0.4 MG SL tablet Place 0.4 mg under the tongue every 5 (five) minutes as needed for chest pain.   Yes Historical Provider, MD  simvastatin (ZOCOR) 20 MG tablet Take 20 mg by mouth every morning.   Yes Historical Provider, MD  cetirizine (ZYRTEC) 10 MG tablet Take 10 mg by mouth daily.    Historical Provider, MD  Fluticasone-Salmeterol (ADVAIR) 250-50 MCG/DOSE AEPB Inhale 1 puff into the lungs 2 (two) times daily.    Historical Provider, MD  ranitidine (ZANTAC) 150 MG capsule Take 1 capsule (150 mg total) by mouth daily. Patient not taking: Reported on 09/01/2016 12/04/15   Everlene Balls, MD  simvastatin (ZOCOR) 40 MG tablet Take 40 mg by mouth daily.    Historical Provider, MD  topiramate (TOPAMAX) 25 MG tablet Take 25 mg by mouth daily.    Historical Provider, MD     Results for orders placed or performed during the hospital encounter of 09/01/16 (from the past 48 hour(s))  Lipase, blood     Status: None   Collection Time: 09/01/16  5:29 AM  Result Value Ref Range   Lipase 47 11 - 51 U/L  Comprehensive metabolic panel     Status: Abnormal   Collection Time: 09/01/16  5:29 AM  Result Value Ref Range   Sodium 135 135 - 145 mmol/L   Potassium 4.3 3.5 - 5.1 mmol/L   Chloride 106 101 - 111 mmol/L   CO2 21 (L) 22 - 32 mmol/L   Glucose, Bld 121 (H) 65 - 99 mg/dL   BUN 13 6 - 20 mg/dL   Creatinine, Ser 1.33 (H) 0.44 - 1.00 mg/dL   Calcium 8.7 (L) 8.9 - 10.3  mg/dL   Total Protein 7.4 6.5 - 8.1 g/dL   Albumin 3.5 3.5 - 5.0 g/dL   AST 30 15 - 41 U/L   ALT 20 14 - 54 U/L   Alkaline Phosphatase 133 (H) 38 - 126 U/L   Total Bilirubin 0.9 0.3 - 1.2 mg/dL   GFR calc non Af Amer 39 (L) >60 mL/min   GFR calc Af Amer 45 (L) >60 mL/min    Comment: (NOTE) The eGFR has been calculated using the CKD EPI equation. This calculation has not been validated in all clinical situations. eGFR's persistently <60 mL/min signify possible Chronic Kidney Disease.    Anion gap 8 5 - 15  CBC with Differential     Status: Abnormal   Collection Time: 09/01/16  5:29 AM  Result Value Ref Range   WBC  8.7 4.0 - 10.5 K/uL   RBC 4.27 3.87 - 5.11 MIL/uL   Hemoglobin 11.6 (L) 12.0 - 15.0 g/dL   HCT 36.3 36.0 - 46.0 %   MCV 85.0 78.0 - 100.0 fL   MCH 27.2 26.0 - 34.0 pg   MCHC 32.0 30.0 - 36.0 g/dL   RDW 13.4 11.5 - 15.5 %   Platelets 236 150 - 400 K/uL   Neutrophils Relative % 75 %   Neutro Abs 6.5 1.7 - 7.7 K/uL   Lymphocytes Relative 17 %   Lymphs Abs 1.5 0.7 - 4.0 K/uL   Monocytes Relative 6 %   Monocytes Absolute 0.6 0.1 - 1.0 K/uL   Eosinophils Relative 2 %   Eosinophils Absolute 0.2 0.0 - 0.7 K/uL   Basophils Relative 0 %   Basophils Absolute 0.0 0.0 - 0.1 K/uL  I-Stat Troponin, ED (not at [facility])     Status: None   Collection Time: 09/01/16  5:48 AM  Result Value Ref Range   Troponin i, poc 0.00 0.00 - 0.08 ng/mL   Comment 3            Comment: Due to the release kinetics of cTnI, a negative result within the first hours of the onset of symptoms does not rule out myocardial infarction with certainty. If myocardial infarction is still suspected, repeat the test at appropriate intervals.   Urinalysis, Routine w reflex microscopic     Status: Abnormal   Collection Time: 09/01/16  9:50 AM  Result Value Ref Range   Color, Urine YELLOW YELLOW   APPearance CLEAR CLEAR   Specific Gravity, Urine 1.004 (L) 1.005 - 1.030   pH 5.5 5.0 - 8.0   Glucose, UA  NEGATIVE NEGATIVE mg/dL   Hgb urine dipstick NEGATIVE NEGATIVE   Bilirubin Urine NEGATIVE NEGATIVE   Ketones, ur NEGATIVE NEGATIVE mg/dL   Protein, ur NEGATIVE NEGATIVE mg/dL   Nitrite NEGATIVE NEGATIVE   Leukocytes, UA NEGATIVE NEGATIVE    Comment: MICROSCOPIC NOT DONE ON URINES WITH NEGATIVE PROTEIN, BLOOD, LEUKOCYTES, NITRITE, OR GLUCOSE <1000 mg/dL.   Ct Abdomen Pelvis W Contrast  Result Date: 09/01/2016 CLINICAL DATA:  Acute right lower quadrant abdominal pain. EXAM: CT ABDOMEN AND PELVIS WITH CONTRAST TECHNIQUE: Multidetector CT imaging of the abdomen and pelvis was performed using the standard protocol following bolus administration of intravenous contrast. CONTRAST:  53m ISOVUE-300 IOPAMIDOL (ISOVUE-300) INJECTION 61% COMPARISON:  CT scan of August 23, 2014. FINDINGS: Lower chest: Stable small subpleural nodule seen in right middle lobe consistent with benign etiology. New 9 mm nodule is noted along the left cardiac border in the left lung base best seen on image number 3 of series 205. Given its rapid change, this is concerning for possible neoplasm. Hepatobiliary: Status post cholecystectomy. Normal liver. Stable dilatation of common bile duct is noted. Pancreas: Normal. Spleen: Normal. Adrenals/Urinary Tract: Adrenal glands and kidneys are unremarkable. No hydronephrosis or renal obstruction is noted. No renal or ureteral calculi are noted. Urinary bladder appears normal. Stomach/Bowel: Status post right hemicolectomy. Ileostomy seen in the left lower quadrant. Continued presence of peristomal hernia which contains loops of small bowel. There is interval development of new hernia in the right lower quadrant of the anterior abdominal wall which contains a loop of small bowel, and does appear to be resulting in incarceration and obstruction. Vascular/Lymphatic: Atherosclerosis of abdominal aorta is noted without aneurysm formation. No significant adenopathy is noted. Reproductive: Uterus and  ovaries are unremarkable. 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 Other:  No abnormal fluid collection is noted. Musculoskeletal: No significant osseous abnormality is noted. IMPRESSION: New 9 mm nodule seen along left cardiac border and left lung base. This is concerning for possible neoplasm. Consider one of the following in 3 months for both low-risk and high-risk individuals: (a) repeat chest CT, (b) follow-up PET-CT, or (c) tissue sampling. This recommendation follows the consensus statement: Guidelines for Management of Incidental Pulmonary Nodules Detected on CT Images: From the Fleischner Society 2017; Radiology 2017; 284:228-243. Status post right hemicolectomy. Ileostomy again seen in left lower quadrant with continued presence of peristomal hernia which contains loops of small bowel. There is the interval development of new hernia and right lower quadrant of anterior abdominal wall which contains a loop of incarcerated small bowel, and does appear to be resulting in obstruction of the more proximal small bowel. Electronically Signed   By: Marijo Conception, M.D.   On: 09/01/2016 09:36    Review of Systems  Constitutional: Negative.   HENT: Negative.        She cannot get stud out of left nostril  Eyes: Negative.   Respiratory: Negative.   Cardiovascular: Positive for chest pain (with nausea and vomiting).  Gastrointestinal: Positive for abdominal pain, heartburn, nausea and vomiting.       She is unsure how long the current hernia in the right lower quadrant has been the current size. It sounds like she's had a bit of a hernia since her surgery. Unable to quantitate when it got larger. It sounds like the hernia hurts her intermittently, but this is also something I could not obtain a firm history about.  Genitourinary: Negative.   Musculoskeletal: Negative.   Skin: Negative.   Neurological: Negative.   Endo/Heme/Allergies: Negative.   Psychiatric/Behavioral: Negative.     Blood pressure  117/71, pulse 70, temperature 97.6 F (36.4 C), temperature source Oral, resp. rate 19, weight 49.9 kg (110 lb), SpO2 98 %. Physical Exam  Constitutional: She is oriented to person, place, and time. No distress.  Woman is from the [place] and does not speak [place]. Her sister is with her she also is unable to speak [place]. Chronically cachectic female in mild distress with abdominal pain.  HENT:  Head: Normocephalic and atraumatic.  She has a stud in her left nostril that is permanent, she says she cannot remove it. She also had some jewelry from the midportion of her nasal septum which the family removed.  Eyes: Right eye exhibits no discharge. Left eye exhibits no discharge. No scleral icterus.  Neck: Normal range of motion. Neck supple. No JVD present. No tracheal deviation present. No thyromegaly present.  Cardiovascular: Normal rate, regular rhythm, normal heart sounds and intact distal pulses.   No murmur heard. Respiratory: Effort normal and breath sounds normal. No respiratory distress. She has no wheezes. She has no rales. She exhibits no tenderness.  GI: She exhibits distension. She exhibits no mass. There is tenderness. There is no rebound and no guarding.  She is having recurrent bouts of nausea with some vomiting. She has a midline surgical incision with significant scarring and hernia. She has a right lower quadrant ventral hernia with palpable indurated bowel. She did have bowel sounds. She has a left sided ileostomy this looks viable with some fluid and gas in the ostomy bag. She reports she emptied the bag earlier this a.m.  Musculoskeletal: She exhibits no edema.  Lymphadenopathy:    She has no cervical adenopathy.  Neurological: She is alert and oriented  to person, place, and time. No cranial nerve deficit.  Skin: Skin is warm and dry. No rash noted. She is not diaphoretic. No erythema. No pallor.  Psychiatric: She has a normal mood and affect. Her behavior is normal. Judgment  and thought content normal.     Assessme/Plan SBO History of duodenal perforation status post sphincterotomy ERCP for common bile duct stones. Status post exploratory laparotomy lysis of adhesions, cauterization of duodenum, duodenal ostomy repair with duodenal perforation and bleeding. Open cholecystectomy, common bile duct exploration and T-tube placement.09/17/10 Dr. 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 Grandville Silos. Mild renal insufficiency History of PAF History of non-STEMI History of acute gastric bleeding History of asthma.  Plan: With the assistance of the on phone interpreters [phone] and [phone] we were able to get a history, initiate treatment, and discuss plan with the patient. Her sister is present she also speaks only in the [place]. We used the speaker phone so they could both hear both sides of the discussion. After some IV pain medication and Zofran for nausea we were able to reduce the right lower quadrant ventral hernia. It does return with cough, but is easily reducible. We discussed plans for insertion of an NG tube and the small bowel protocol. We attempted to call her son Dwyane Dee at [phone] on 3 occasions and were unsuccessful. She reports her son will be here later today after work. I have attempted to answer all her questions while the interpreter was on the phone.  Venus Gilles, PA-C 09/01/2016, 10:26 AM

## 2016-09-01 NOTE — Progress Notes (Signed)
Notified Radiology Department of the Gastrografin administration through NG at 1545.

## 2016-09-01 NOTE — ED Notes (Signed)
1 bottle contrast almost completed

## 2016-09-01 NOTE — ED Notes (Signed)
MD at bedside, Dr. Marlyne BeardsJennings.

## 2016-09-01 NOTE — Progress Notes (Signed)
NG in she is not having pain like she did before. Son is not here and other family members are gone.  She is on SB protocol now.

## 2016-09-01 NOTE — ED Triage Notes (Signed)
Patient present with c/o RLQ pain that has been going on since Saturday.  Denies vomiting +nausea

## 2016-09-01 NOTE — ED Triage Notes (Signed)
Denies urinary symptoms

## 2016-09-01 NOTE — Progress Notes (Signed)
6N RN attempted to receive report. ED to call 6N RN back.

## 2016-09-01 NOTE — ED Notes (Signed)
[name], Nepali interpreter [phone] used for translation during NG placement and assessment. Dr. Lindie SpruceWyatt at bedside.

## 2016-09-01 NOTE — ED Notes (Signed)
Patient transported to MRI 

## 2016-09-02 ENCOUNTER — Inpatient Hospital Stay (HOSPITAL_COMMUNITY): Payer: Medicare Other

## 2016-09-02 LAB — CBC
HEMATOCRIT: 32.7 % — AB (ref 36.0–46.0)
Hemoglobin: 10.2 g/dL — ABNORMAL LOW (ref 12.0–15.0)
MCH: 26.3 pg (ref 26.0–34.0)
MCHC: 31.2 g/dL (ref 30.0–36.0)
MCV: 84.3 fL (ref 78.0–100.0)
PLATELETS: 214 10*3/uL (ref 150–400)
RBC: 3.88 MIL/uL (ref 3.87–5.11)
RDW: 13 % (ref 11.5–15.5)
WBC: 6.8 10*3/uL (ref 4.0–10.5)

## 2016-09-02 LAB — PROTIME-INR
INR: 1.27
Prothrombin Time: 16 seconds — ABNORMAL HIGH (ref 11.4–15.2)

## 2016-09-02 LAB — BASIC METABOLIC PANEL
Anion gap: 4 — ABNORMAL LOW (ref 5–15)
BUN: 13 mg/dL (ref 6–20)
CHLORIDE: 114 mmol/L — AB (ref 101–111)
CO2: 22 mmol/L (ref 22–32)
CREATININE: 1.18 mg/dL — AB (ref 0.44–1.00)
Calcium: 8 mg/dL — ABNORMAL LOW (ref 8.9–10.3)
GFR calc Af Amer: 52 mL/min — ABNORMAL LOW (ref >60)
GFR calc non Af Amer: 45 mL/min — ABNORMAL LOW (ref >60)
GLUCOSE: 83 mg/dL (ref 65–99)
POTASSIUM: 4 mmol/L (ref 3.5–5.1)
SODIUM: 140 mmol/L (ref 135–145)

## 2016-09-02 LAB — APTT: aPTT: 47 seconds — ABNORMAL HIGH (ref 24–36)

## 2016-09-02 MED ORDER — METOCLOPRAMIDE HCL 5 MG/ML IJ SOLN
5.0000 mg | Freq: Two times a day (BID) | INTRAMUSCULAR | Status: DC
Start: 2016-09-02 — End: 2016-09-03
  Administered 2016-09-02 (×2): 5 mg via INTRAVENOUS
  Filled 2016-09-02 (×2): qty 2

## 2016-09-02 MED ORDER — ORAL CARE MOUTH RINSE
15.0000 mL | Freq: Two times a day (BID) | OROMUCOSAL | Status: DC
  Administered 2016-09-02: 15 mL via OROMUCOSAL

## 2016-09-02 MED ORDER — ALBUTEROL SULFATE (2.5 MG/3ML) 0.083% IN NEBU: 2.5000 mg | INHALATION_SOLUTION | Freq: Four times a day (QID) | RESPIRATORY_TRACT | Status: DC | PRN

## 2016-09-02 MED ORDER — CHLORHEXIDINE GLUCONATE 0.12 % MT SOLN
15.0000 mL | Freq: Two times a day (BID) | OROMUCOSAL | Status: DC
  Administered 2016-09-02 – 2016-09-04 (×5): 15 mL via OROMUCOSAL
  Filled 2016-09-02 (×4): qty 15

## 2016-09-02 MED ORDER — NITROGLYCERIN 0.4 MG SL SUBL: 0.4000 mg | SUBLINGUAL_TABLET | SUBLINGUAL | Status: DC | PRN

## 2016-09-02 MED ORDER — METOPROLOL TARTRATE 5 MG/5ML IV SOLN
2.5000 mg | Freq: Four times a day (QID) | INTRAVENOUS | Status: DC
  Administered 2016-09-02: 2.5 mg via INTRAVENOUS
  Filled 2016-09-02 (×2): qty 5

## 2016-09-02 MED ORDER — MOMETASONE FURO-FORMOTEROL FUM 200-5 MCG/ACT IN AERO
2.0000 | INHALATION_SPRAY | Freq: Two times a day (BID) | RESPIRATORY_TRACT | Status: DC
  Administered 2016-09-02 – 2016-09-05 (×6): 2 via RESPIRATORY_TRACT
  Filled 2016-09-02: qty 8.8

## 2016-09-02 NOTE — Progress Notes (Signed)
Subjective: She is doing much better this a.m. Her son is there in the room with her. His speaks [place]. Noted she's had 775 ml through the ostomy since admission. She is no longer distended or having pain. I confirm the medicine she is on with the son. I discussed the plan with the son.  Objective: Vital signs in last 24 hours: Temp:  [98.1 F (36.7 C)-98.4 F (36.9 C)] 98.1 F (36.7 C) (10/25 0540) Pulse Rate:  [59-73] 59 (10/25 0540) Resp:  [13-19] 16 (10/25 0540) BP: (106-160)/(53-98) 129/57 (10/25 0540) SpO2:  [92 %-99 %] 96 % (10/25 0540) Weight:  [49.9 kg (110 lb 0.2 oz)] 49.9 kg (110 lb 0.2 oz) (10/24 1436) Last BM Date: 09/01/16  NPO 1520 IV NG 350 775 from the ileostomy  Afebrile, VSS Creatinine is better and WBC is normal   Intake/Output from previous day: 10/24 0701 - 10/25 0700 In: 1520 [I.V.:1420; IV Piggyback:100] Out: 1125 [Emesis/NG output:350; Stool:775] Intake/Output this shift: No intake/output data recorded.  General appearance: alert, cooperative and no distress Resp: clear to auscultation bilaterally GI: soft, non-tender; bowel sounds normal; no masses,  no organomegaly and good output thru the ileostomy now.  No distension or tenderness.  Hernias persist.  Lab Results:   Recent Labs  09/01/16 0529 09/02/16 0356  WBC 8.7 6.8  HGB 11.6* 10.2*  HCT 36.3 32.7*  PLT 236 214    BMET  Recent Labs  09/01/16 0529 09/02/16 0356  NA 135 140  K 4.3 4.0  CL 106 114*  CO2 21* 22  GLUCOSE 121* 83  BUN 13 13  CREATININE 1.33* 1.18*  CALCIUM 8.7* 8.0*   PT/INR  Recent Labs  09/02/16 0356  LABPROT 16.0*  INR 1.27     Recent Labs Lab 09/01/16 0529  AST 30  ALT 20  ALKPHOS 133*  BILITOT 0.9  PROT 7.4  ALBUMIN 3.5     Lipase     Component Value Date/Time   LIPASE 47 09/01/2016 0529     Studies/Results: Ct Abdomen Pelvis W Contrast  Result Date: 09/01/2016 CLINICAL DATA:  Acute right lower quadrant abdominal pain.  EXAM: CT ABDOMEN AND PELVIS WITH CONTRAST TECHNIQUE: Multidetector CT imaging of the abdomen and pelvis was performed using the standard protocol following bolus administration of intravenous contrast. CONTRAST:  80mL ISOVUE-300 IOPAMIDOL (ISOVUE-300) INJECTION 61% COMPARISON:  CT scan of August 23, 2014. FINDINGS: Lower chest: Stable small subpleural nodule seen in right middle lobe consistent with benign etiology. New 9 mm nodule is noted along the left cardiac border in the left lung base best seen on image number 3 of series 205. Given its rapid change, this is concerning for possible neoplasm. Hepatobiliary: Status post cholecystectomy. Normal liver. Stable dilatation of common bile duct is noted. Pancreas: Normal. Spleen: Normal. Adrenals/Urinary Tract: Adrenal glands and kidneys are unremarkable. No hydronephrosis or renal obstruction is noted. No renal or ureteral calculi are noted. Urinary bladder appears normal. Stomach/Bowel: Status post right hemicolectomy. Ileostomy seen in the left lower quadrant. Continued presence of peristomal hernia which contains loops of small bowel. There is interval development of new hernia in the right lower quadrant of the anterior abdominal wall which contains a loop of small bowel, and does appear to be resulting in incarceration and obstruction. Vascular/Lymphatic: Atherosclerosis of abdominal aorta is noted without aneurysm formation. No significant adenopathy is noted. Reproductive: Uterus and ovaries are unremarkable. 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 Other: No abnormal fluid collection is noted. Musculoskeletal: No significant  osseous abnormality is noted. IMPRESSION: New 9 mm nodule seen along left cardiac border and left lung base. This is concerning for possible neoplasm. Consider one of the following in 3 months for both low-risk and high-risk individuals: (a) repeat chest CT, (b) follow-up PET-CT, or (c) tissue sampling. This recommendation follows the  consensus statement: Guidelines for Management of Incidental Pulmonary Nodules Detected on CT Images: From the Fleischner Society 2017; Radiology 2017; 284:228-243. Status post right hemicolectomy. Ileostomy again seen in left lower quadrant with continued presence of peristomal hernia which contains loops of small bowel. There is the interval development of new hernia and right lower quadrant of anterior abdominal wall which contains a loop of incarcerated small bowel, and does appear to be resulting in obstruction of the more proximal small bowel. Electronically Signed   By: Lupita RaiderJames  Green Jr, M.D.   On: 09/01/2016 09:36   Dg Abd Portable 1v-small Bowel Obstruction Protocol-initial, 8 Hr Delay  Result Date: 09/02/2016 CLINICAL DATA:  75 year old female with small bowel obstruction. 8 hour delay from oral contrast. EXAM: PORTABLE ABDOMEN - 1 VIEW COMPARISON:  Earlier Abdominal radiograph dated 09/01/2016 and CT dated 09/01/2016 FINDINGS: An enteric tube is noted with tip and side ports in the left hemiabdomen over the gastric silhouette. No bowel dilatation noted. There is dilution of the oral contrast with residual contrast seen in nondistended loops of small bowel in the left hemiabdomen. There is a collapsed loop of bowel in the lateral aspect of the left hemiabdomen corresponding to the location of the descending colon seen on the CT. This loop of bowel demonstrate a somewhat similar attenuation to the more medial small-bowel loops and may contain some contrast. Evaluation is limited due to dilution of the contrast, however no definite dilated bowel loops identified. There is slight increased radiopacity of the renal silhouette which may represent a degree of contrast retention. Correlation with kidney function tests recommended. Excreted contrast seen within the bladder. Right upper quadrant cholecystectomy clips noted. The soft tissues and osseous structures are otherwise unremarkable. IMPRESSION: Limited  evaluation due to dilution of the oral contrast. No definite dilated bowel identified. A collapsed loop of bowel in the lateral aspect of the left hemi abdomen likely represents the descending colon and may contain oral contrast. Was enteric tube the tip and side-port in the stomach. Electronically Signed   By: Elgie CollardArash  Radparvar M.D.   On: 09/02/2016 00:25   Dg Abd Portable 1v-small Bowel Protocol-position Verification  Result Date: 09/01/2016 CLINICAL DATA:  NG tube insertion. EXAM: PORTABLE ABDOMEN - 1 VIEW COMPARISON:  CT 09/01/2016. FINDINGS: NG tube noted with tip projected over the stomach. Oral contrast from prior CT is noted small bowel. Left lower ostomy noted. Left lateral lower abdominal wall hernia again noted. Herniation of small bowel present. This along with right lower abdominal wall hernia better identified on prior CT. Contrast noted in the kidneys and bladder from prior CT. No acute bony abnormality . IMPRESSION: 1. NG tube noted over the stomach. 2. Oral contrast is noted in the small bowel from prior CT. Left lower quadrant ostomy is present. Left lower abdominal wall hernia is again noted. This along with the right abdominal wall hernia better identified on recent CT. Electronically Signed   By: Maisie Fushomas  Register   On: 09/01/2016 13:14   Dg Abd Portable 2v  Result Date: 09/02/2016 CLINICAL DATA:  Small bowel obstruction. EXAM: PORTABLE ABDOMEN - 2 VIEW COMPARISON:  Radiograph of September 01, 2016. FINDINGS: The bowel gas  pattern is normal. There is no evidence of free air. Distal tip of nasogastric tube is seen in the stomach. Status post cholecystectomy. No radio-opaque calculi or other significant radiographic abnormality is seen. IMPRESSION: Nasogastric tube tip seen in stomach. No evidence of bowel obstruction or ileus. Electronically Signed   By: Lupita Raider, M.D.   On: 09/02/2016 08:50   Prior to Admission medications   Medication Sig Start Date End Date Taking? Authorizing  Provider  albuterol (PROVENTIL HFA;VENTOLIN HFA) 108 (90 BASE) MCG/ACT inhaler Inhale 1 puff into the lungs every 6 (six) hours as needed for wheezing or shortness of breath. yes  Yes Historical Provider, MD  amLODipine (NORVASC) 5 MG tablet Take 5 mg by mouth daily. yes  Yes Historical Provider, MD  clopidogrel (PLAVIX) 75 MG tablet Take 75 mg by mouth daily. yes  Yes Historical Provider, MD  esomeprazole (NEXIUM) 20 MG capsule Take 20 mg by mouth daily at 12 noon. yes  Yes Historical Provider, MD  metoCLOPramide (REGLAN) 10 MG tablet Take 10 mg by mouth at bedtime. yes  Yes Historical Provider, MD  metoprolol tartrate (LOPRESSOR) 25 MG tablet Take 12.5 mg by mouth 2 (two) times daily. yes  Yes Historical Provider, MD  nitroGLYCERIN (NITROSTAT) 0.4 MG SL tablet Place 0.4 mg under the tongue every 5 (five) minutes as needed for chest pain. yes  Yes Historical Provider, MD  simvastatin (ZOCOR) 20 MG tablet Take 20 mg by mouth every morning. yes  Yes Historical Provider, MD  cetirizine (ZYRTEC) 10 MG tablet Take 10 mg by mouth daily.    Historical Provider, MD  Fluticasone-Salmeterol (ADVAIR) 250-50 MCG/DOSE AEPB Inhale 1 puff into the lungs 2 (two) times daily. yes   Historical Provider, MD  ranitidine (ZANTAC) 150 MG capsule Take 1 capsule (150 mg total) by mouth daily. Patient not taking: Reported on 09/01/2016 12/04/15   Tomasita Crumble, MD  simvastatin (ZOCOR) 40 MG tablet Take 40 mg by mouth daily.    Historical Provider, MD  topiramate (TOPAMAX) 25 MG tablet Take 25 mg by mouth daily.    Historical Provider, MD    Medications: . famotidine (PEPCID) IV  20 mg Intravenous Q12H  . heparin  5,000 Units Subcutaneous Q8H   . sodium chloride 100 mL/hr at 09/02/16 0217    History of PAF History of non-STEMI History of acute gastric bleeding History of asthma.  Assessment/Plan SBO/large ventral RLQ, midline and parastomal hernias History of duodenal perforation status post sphincterotomy ERCP  for common bile duct stones. Status post exploratory laparotomy lysis of adhesions, cauterization of duodenum, duodenal ostomy repair with duodenal perforation and bleeding. Open cholecystectomy, common bile duct exploration and T-tube placement.09/17/10 Dr. 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 Janee Morn. Mild renal insufficiency- creatinine improving with hydration 1.18 this Am FEN:  NPO/IV fluids ID:  None [id number]  Plan: Clamping trials of the NG today, ice chips, I restarted her inhalers, and Reglan. We will mobilize and see how she does with ambulation. Advance diet as she tolerates. Hopefully remove the NG in the a.m. I discussed this with the son, I also told him that repairing all of her hernias would be very difficult. Follow labs and recheck film in AM.   LOS: 1 day    Kaleb Linquist 09/02/2016 [phone]

## 2016-09-02 NOTE — Progress Notes (Signed)
Per orders, clamping NG trial started. Clamped at 10:15 will resume to suction in 4 hours.

## 2016-09-03 ENCOUNTER — Inpatient Hospital Stay (HOSPITAL_COMMUNITY): Payer: Medicare Other

## 2016-09-03 LAB — CBC
HCT: 32.6 % — ABNORMAL LOW (ref 36.0–46.0)
Hemoglobin: 10.4 g/dL — ABNORMAL LOW (ref 12.0–15.0)
MCH: 26.6 pg (ref 26.0–34.0)
MCHC: 31.9 g/dL (ref 30.0–36.0)
MCV: 83.4 fL (ref 78.0–100.0)
PLATELETS: 213 10*3/uL (ref 150–400)
RBC: 3.91 MIL/uL (ref 3.87–5.11)
RDW: 12.8 % (ref 11.5–15.5)
WBC: 7.4 10*3/uL (ref 4.0–10.5)

## 2016-09-03 LAB — BASIC METABOLIC PANEL
ANION GAP: 8 (ref 5–15)
BUN: 18 mg/dL (ref 6–20)
CALCIUM: 7.8 mg/dL — AB (ref 8.9–10.3)
CO2: 17 mmol/L — ABNORMAL LOW (ref 22–32)
CREATININE: 1.11 mg/dL — AB (ref 0.44–1.00)
Chloride: 114 mmol/L — ABNORMAL HIGH (ref 101–111)
GFR calc Af Amer: 56 mL/min — ABNORMAL LOW (ref >60)
GFR, EST NON AFRICAN AMERICAN: 48 mL/min — AB (ref >60)
GLUCOSE: 50 mg/dL — AB (ref 65–99)
Potassium: 3.9 mmol/L (ref 3.5–5.1)
Sodium: 139 mmol/L (ref 135–145)

## 2016-09-03 MED ORDER — METOPROLOL TARTRATE 12.5 MG HALF TABLET: 12.5000 mg | ORAL_TABLET | Freq: Two times a day (BID) | ORAL | Status: DC

## 2016-09-03 MED ORDER — METOPROLOL TARTRATE 12.5 MG HALF TABLET
12.5000 mg | ORAL_TABLET | Freq: Two times a day (BID) | ORAL | Status: DC
  Administered 2016-09-03 – 2016-09-05 (×5): 12.5 mg via ORAL
  Filled 2016-09-03 (×5): qty 1

## 2016-09-03 MED ORDER — METOCLOPRAMIDE HCL 10 MG PO TABS
10.0000 mg | ORAL_TABLET | Freq: Every day | ORAL | Status: DC
  Administered 2016-09-03 – 2016-09-04 (×2): 10 mg via ORAL
  Filled 2016-09-03 (×2): qty 1

## 2016-09-03 MED ORDER — FAMOTIDINE 20 MG PO TABS
20.0000 mg | ORAL_TABLET | Freq: Two times a day (BID) | ORAL | Status: DC
  Administered 2016-09-03 – 2016-09-05 (×4): 20 mg via ORAL
  Filled 2016-09-03 (×3): qty 1
  Filled 2016-09-03: qty 2

## 2016-09-03 NOTE — Progress Notes (Signed)
Subjective: She is doing better, she emptied her ostomy bag several times yesterday. Her stomach is soft and she has bowel sounds. She would like real food. It's unclear how much ambulation was accomplished yesterday. Interpreter [phone] was used during the evaluation.  Objective: Vital signs in last 24 hours: Temp:  [97.5 F (36.4 C)-98.7 F (37.1 C)] 98.4 F (36.9 C) (10/26 0540) Pulse Rate:  [52-66] 66 (10/26 0540) Resp:  [15-18] 15 (10/26 0540) BP: (117-139)/(55-71) 139/59 (10/26 0540) SpO2:  [92 %-100 %] 94 % (10/26 0816) Last BM Date: 09/01/16 650 per NG PO not recorded 100 recorded from the ostomy?? Afebrile, VSS Labs  Continue to improve Intake/Output from previous day: 10/25 0701 - 10/26 0700 In: 800 [I.V.:800] Out: 1500 [Urine:750; Emesis/NG output:650; Stool:100] Intake/Output this shift: No intake/output data recorded.  General appearance: alert, cooperative and no distress Resp: clear to auscultation bilaterally GI: soft, non-tender; bowel sounds normal; no masses,  no organomegaly and Ostomy appears to be working well. No further abdominal distention or discomfort.  Lab Results:   Recent Labs  09/02/16 0356 09/03/16 0414  WBC 6.8 7.4  HGB 10.2* 10.4*  HCT 32.7* 32.6*  PLT 214 213    BMET  Recent Labs  09/02/16 0356 09/03/16 0414  NA 140 139  K 4.0 3.9  CL 114* 114*  CO2 22 17*  GLUCOSE 83 50*  BUN 13 18  CREATININE 1.18* 1.11*  CALCIUM 8.0* 7.8*   PT/INR  Recent Labs  09/02/16 0356  LABPROT 16.0*  INR 1.27     Recent Labs Lab 09/01/16 0529  AST 30  ALT 20  ALKPHOS 133*  BILITOT 0.9  PROT 7.4  ALBUMIN 3.5     Lipase     Component Value Date/Time   LIPASE 47 09/01/2016 0529     Studies/Results: Dg Abd 1 View  Result Date: 09/03/2016 CLINICAL DATA:  Small bowel obstruction, follow-up EXAM: ABDOMEN - 1 VIEW COMPARISON:  09/02/2016 abdominal radiograph FINDINGS: Enteric tube terminates in the body of the stomach.  Surgical clips are seen in the right upper quadrant of the abdomen. No dilated small bowel loops. Minimal gas in the large bowel. No evidence of pneumatosis or pneumoperitoneum. No pathologic soft tissue calcifications. IMPRESSION: Enteric tube terminates in the body of the stomach. Nonobstructive bowel gas pattern. Electronically Signed   By: Delbert Phenix M.D.   On: 09/03/2016 08:17   Ct Abdomen Pelvis W Contrast  Result Date: 09/01/2016 CLINICAL DATA:  Acute right lower quadrant abdominal pain. EXAM: CT ABDOMEN AND PELVIS WITH CONTRAST TECHNIQUE: Multidetector CT imaging of the abdomen and pelvis was performed using the standard protocol following bolus administration of intravenous contrast. CONTRAST:  80mL ISOVUE-300 IOPAMIDOL (ISOVUE-300) INJECTION 61% COMPARISON:  CT scan of August 23, 2014. FINDINGS: Lower chest: Stable small subpleural nodule seen in right middle lobe consistent with benign etiology. New 9 mm nodule is noted along the left cardiac border in the left lung base best seen on image number 3 of series 205. Given its rapid change, this is concerning for possible neoplasm. Hepatobiliary: Status post cholecystectomy. Normal liver. Stable dilatation of common bile duct is noted. Pancreas: Normal. Spleen: Normal. Adrenals/Urinary Tract: Adrenal glands and kidneys are unremarkable. No hydronephrosis or renal obstruction is noted. No renal or ureteral calculi are noted. Urinary bladder appears normal. Stomach/Bowel: Status post right hemicolectomy. Ileostomy seen in the left lower quadrant. Continued presence of peristomal hernia which contains loops of small bowel. There is interval development of new hernia  in the right lower quadrant of the anterior abdominal wall which contains a loop of small bowel, and does appear to be resulting in incarceration and obstruction. Vascular/Lymphatic: Atherosclerosis of abdominal aorta is noted without aneurysm formation. No significant adenopathy is noted.  Reproductive: Uterus and ovaries are unremarkable. 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 Other: No abnormal fluid collection is noted. Musculoskeletal: No significant osseous abnormality is noted. IMPRESSION: New 9 mm nodule seen along left cardiac border and left lung base. This is concerning for possible neoplasm. Consider one of the following in 3 months for both low-risk and high-risk individuals: (a) repeat chest CT, (b) follow-up PET-CT, or (c) tissue sampling. This recommendation follows the consensus statement: Guidelines for Management of Incidental Pulmonary Nodules Detected on CT Images: From the Fleischner Society 2017; Radiology 2017; 284:228-243. Status post right hemicolectomy. Ileostomy again seen in left lower quadrant with continued presence of peristomal hernia which contains loops of small bowel. There is the interval development of new hernia and right lower quadrant of anterior abdominal wall which contains a loop of incarcerated small bowel, and does appear to be resulting in obstruction of the more proximal small bowel. Electronically Signed   By: Lupita Raider, M.D.   On: 09/01/2016 09:36   Dg Abd Portable 1v-small Bowel Obstruction Protocol-initial, 8 Hr Delay  Result Date: 09/02/2016 CLINICAL DATA:  75 year old female with small bowel obstruction. 8 hour delay from oral contrast. EXAM: PORTABLE ABDOMEN - 1 VIEW COMPARISON:  Earlier Abdominal radiograph dated 09/01/2016 and CT dated 09/01/2016 FINDINGS: An enteric tube is noted with tip and side ports in the left hemiabdomen over the gastric silhouette. No bowel dilatation noted. There is dilution of the oral contrast with residual contrast seen in nondistended loops of small bowel in the left hemiabdomen. There is a collapsed loop of bowel in the lateral aspect of the left hemiabdomen corresponding to the location of the descending colon seen on the CT. This loop of bowel demonstrate a somewhat similar attenuation to the more medial  small-bowel loops and may contain some contrast. Evaluation is limited due to dilution of the contrast, however no definite dilated bowel loops identified. There is slight increased radiopacity of the renal silhouette which may represent a degree of contrast retention. Correlation with kidney function tests recommended. Excreted contrast seen within the bladder. Right upper quadrant cholecystectomy clips noted. The soft tissues and osseous structures are otherwise unremarkable. IMPRESSION: Limited evaluation due to dilution of the oral contrast. No definite dilated bowel identified. A collapsed loop of bowel in the lateral aspect of the left hemi abdomen likely represents the descending colon and may contain oral contrast. Was enteric tube the tip and side-port in the stomach. Electronically Signed   By: Elgie Collard M.D.   On: 09/02/2016 00:25   Dg Abd Portable 1v-small Bowel Protocol-position Verification  Result Date: 09/01/2016 CLINICAL DATA:  NG tube insertion. EXAM: PORTABLE ABDOMEN - 1 VIEW COMPARISON:  CT 09/01/2016. FINDINGS: NG tube noted with tip projected over the stomach. Oral contrast from prior CT is noted small bowel. Left lower ostomy noted. Left lateral lower abdominal wall hernia again noted. Herniation of small bowel present. This along with right lower abdominal wall hernia better identified on prior CT. Contrast noted in the kidneys and bladder from prior CT. No acute bony abnormality . IMPRESSION: 1. NG tube noted over the stomach. 2. Oral contrast is noted in the small bowel from prior CT. Left lower quadrant ostomy is present. Left lower abdominal wall  hernia is again noted. This along with the right abdominal wall hernia better identified on recent CT. Electronically Signed   By: Maisie Fushomas  Register   On: 09/01/2016 13:14   Dg Abd Portable 2v  Result Date: 09/02/2016 CLINICAL DATA:  Small bowel obstruction. EXAM: PORTABLE ABDOMEN - 2 VIEW COMPARISON:  Radiograph of September 01, 2016. FINDINGS: The bowel gas pattern is normal. There is no evidence of free air. Distal tip of nasogastric tube is seen in the stomach. Status post cholecystectomy. No radio-opaque calculi or other significant radiographic abnormality is seen. IMPRESSION: Nasogastric tube tip seen in stomach. No evidence of bowel obstruction or ileus. Electronically Signed   By: Lupita RaiderJames  Green Jr, M.D.   On: 09/02/2016 08:50    Medications: . chlorhexidine  15 mL Mouth Rinse BID  . famotidine (PEPCID) IV  20 mg Intravenous Q12H  . heparin  5,000 Units Subcutaneous Q8H  . mouth rinse  15 mL Mouth Rinse q12n4p  . metoCLOPramide (REGLAN) injection  5 mg Intravenous Q12H  . metoprolol  2.5 mg Intravenous Q6H  . mometasone-formoterol  2 puff Inhalation BID    Assessment/Plan SBO/large ventral RLQ, midline and parastomal hernias History of duodenal perforation status post sphincterotomy ERCP for common bile duct stones. Status post exploratory laparotomy lysis of adhesions, cauterization of duodenum, duodenal ostomy repair with duodenal perforation and bleeding. Open cholecystectomy, common bile duct exploration and T-tube placement.09/17/10 Dr. 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 Janee Mornhompson. Mild renal insufficiency- creatinine improving with hydration 1.18 this Am FEN:  ice chips/IV fluids  +advance to clears now and fulls later today. ID:  None [id number]    Plan: Clamping her NG give her clears now if she does well and has no problems DC NG at lunchtime advance her to a full liquid diet. Ambulate in halls 4 times a day decrease IV fluids recheck labs in a.m. Hopefully a soft diet and home Friday afternoon.   LOS: 2 days    Issaiah Seabrooks 09/03/2016 [phone]

## 2016-09-04 LAB — BASIC METABOLIC PANEL
Anion gap: 4 — ABNORMAL LOW (ref 5–15)
BUN: 15 mg/dL (ref 6–20)
CALCIUM: 7.9 mg/dL — AB (ref 8.9–10.3)
CHLORIDE: 113 mmol/L — AB (ref 101–111)
CO2: 21 mmol/L — AB (ref 22–32)
CREATININE: 1.23 mg/dL — AB (ref 0.44–1.00)
GFR calc Af Amer: 49 mL/min — ABNORMAL LOW (ref >60)
GFR calc non Af Amer: 42 mL/min — ABNORMAL LOW (ref >60)
Glucose, Bld: 95 mg/dL (ref 65–99)
Potassium: 3.5 mmol/L (ref 3.5–5.1)
SODIUM: 138 mmol/L (ref 135–145)

## 2016-09-04 LAB — CBC
HEMATOCRIT: 30.9 % — AB (ref 36.0–46.0)
HEMOGLOBIN: 9.7 g/dL — AB (ref 12.0–15.0)
MCH: 26.3 pg (ref 26.0–34.0)
MCHC: 31.4 g/dL (ref 30.0–36.0)
MCV: 83.7 fL (ref 78.0–100.0)
Platelets: 213 10*3/uL (ref 150–400)
RBC: 3.69 MIL/uL — ABNORMAL LOW (ref 3.87–5.11)
RDW: 13.1 % (ref 11.5–15.5)
WBC: 6.8 10*3/uL (ref 4.0–10.5)

## 2016-09-04 MED ORDER — AMLODIPINE BESYLATE 5 MG PO TABS
5.0000 mg | ORAL_TABLET | Freq: Every day | ORAL | Status: DC
  Administered 2016-09-05: 5 mg via ORAL
  Filled 2016-09-04: qty 1

## 2016-09-04 MED ORDER — SIMVASTATIN 20 MG PO TABS
20.0000 mg | ORAL_TABLET | Freq: Every day | ORAL | Status: DC
  Administered 2016-09-04: 20 mg via ORAL
  Filled 2016-09-04: qty 1

## 2016-09-04 MED ORDER — CLOPIDOGREL BISULFATE 75 MG PO TABS
75.0000 mg | ORAL_TABLET | Freq: Every day | ORAL | Status: DC
  Administered 2016-09-04 – 2016-09-05 (×2): 75 mg via ORAL
  Filled 2016-09-04 (×2): qty 1

## 2016-09-04 NOTE — Progress Notes (Signed)
Tolerating soft diet, ostomy working and she has no abdominal pain.  She is up walking now with IV out.  I offered to let her go home tonight, but she would like to wait till AM tomorrow.   Interpreter:  [phone]

## 2016-09-04 NOTE — Progress Notes (Signed)
Subjective: Patient reports she's feeling better. She is afraid to walk with the IV pole. No one from nursing has come in to ambulate her. She is tolerating the full liquids well. She emptied her ostomy bag 4 times yesterday.  Objective: Vital signs in last 24 hours: Temp:  [97.8 F (36.6 C)-98.4 F (36.9 C)] 98.4 F (36.9 C) (10/27 0435) Pulse Rate:  [59-67] 67 (10/27 0435) Resp:  [17-20] 17 (10/27 0435) BP: (109-129)/(57-61) 129/61 (10/27 0435) SpO2:  [90 %-100 %] 90 % (10/27 0729) Last BM Date: 09/03/16 PO not recorded 1 BM recorded yesterday Nothing recorded from the NG. Afebrile vital signs are stable. Creatinine is 1.23 CBC is normal.   Intake/Output from previous day: 10/26 0701 - 10/27 0700 In: -  Out: 500 [Urine:500] Intake/Output this shift: Total I/O In: -  Out: 500 [Urine:500]  General appearance: alert, cooperative and no distress Resp: clear to auscultation bilaterally GI: soft, non-tender; bowel sounds normal; no masses,  no organomegaly and No change in hernias they're all currently reduced.  Lab Results:   Recent Labs  09/03/16 0414 09/04/16 0547  WBC 7.4 6.8  HGB 10.4* 9.7*  HCT 32.6* 30.9*  PLT 213 213    BMET  Recent Labs  09/03/16 0414 09/04/16 0547  NA 139 138  K 3.9 3.5  CL 114* 113*  CO2 17* 21*  GLUCOSE 50* 95  BUN 18 15  CREATININE 1.11* 1.23*  CALCIUM 7.8* 7.9*   PT/INR  Recent Labs  09/02/16 0356  LABPROT 16.0*  INR 1.27     Recent Labs Lab 09/01/16 0529  AST 30  ALT 20  ALKPHOS 133*  BILITOT 0.9  PROT 7.4  ALBUMIN 3.5     Lipase     Component Value Date/Time   LIPASE 47 09/01/2016 0529     Studies/Results: Dg Abd 1 View  Result Date: 09/03/2016 CLINICAL DATA:  Small bowel obstruction, follow-up EXAM: ABDOMEN - 1 VIEW COMPARISON:  09/02/2016 abdominal radiograph FINDINGS: Enteric tube terminates in the body of the stomach. Surgical clips are seen in the right upper quadrant of the abdomen. No  dilated small bowel loops. Minimal gas in the large bowel. No evidence of pneumatosis or pneumoperitoneum. No pathologic soft tissue calcifications. IMPRESSION: Enteric tube terminates in the body of the stomach. Nonobstructive bowel gas pattern. Electronically Signed   By: Delbert Phenix M.D.   On: 09/03/2016 08:17    Medications: . chlorhexidine  15 mL Mouth Rinse BID  . famotidine  20 mg Oral BID  . heparin  5,000 Units Subcutaneous Q8H  . mouth rinse  15 mL Mouth Rinse q12n4p  . metoCLOPramide  10 mg Oral QHS  . metoprolol tartrate  12.5 mg Oral BID  . mometasone-formoterol  2 puff Inhalation BID    Assessment/Plan SBO/large ventral RLQ, midline and parastomal hernias History of duodenal perforation status post sphincterotomy ERCP for common bile duct stones. Status post exploratory laparotomy lysis of adhesions, cauterization of duodenum, duodenal ostomy repair with duodenal perforation and bleeding. Open cholecystectomy, common bile duct exploration and T-tube placement.09/17/10 Dr. 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 Janee Morn. Mild renal insufficiency- creatinine improving with hydration 1.18 this Am stable FEN: Full liquids/IV fluids  +advance to clears now and fulls later today. ID: None [id number]   Plan: Advance her diet, saline lock her IV, if she does well home later today or tomorrow.    LOS: 3 days  Sherrie GeorgeJENNINGS,Aerik Polan 09/04/2016 [phone]

## 2016-09-04 NOTE — Discharge Instructions (Signed)
Ventral Hernia A ventral hernia (also called an incisional hernia) is a hernia that occurs at the site of a previous surgical cut (incision) in the abdomen. The abdominal wall spans from your lower chest down to your pelvis. If the abdominal wall is weakened from a surgical incision, a hernia can occur. A hernia is a bulge of bowel or muscle tissue pushing out on the weakened part of the abdominal wall. Ventral hernias can get bigger from straining or lifting. Obese and older people are at higher risk for a ventral hernia. People who develop infections after surgery or require repeat incisions at the same site on the abdomen are also at increased risk. CAUSES  A ventral hernia occurs because of weakness in the abdominal wall at an incision site.  SYMPTOMS  Common symptoms include:  A visible bulge or lump on the abdominal wall.  Pain or tenderness around the lump.  Increased discomfort if you cough or make a sudden movement. If the hernia has blocked part of the intestine, a serious complication can occur (incarcerated or strangulated hernia). This can become a problem that requires emergency surgery because the blood flow to the blocked intestine may be cut off. Symptoms may include:  Feeling sick to your stomach (nauseous).  Throwing up (vomiting).  Stomach swelling (distention) or bloating.  Fever.  Rapid heartbeat. DIAGNOSIS  Your health care provider will take a medical history and perform a physical exam. Various tests may be ordered, such as:  Blood tests.  Urine tests.  Ultrasonography.  X-rays.  Computed tomography (CT). TREATMENT  Watchful waiting may be all that is needed for a smaller hernia that does not cause symptoms. Your health care provider may recommend the use of a supportive belt (truss) that helps to keep the abdominal wall intact. For larger hernias or those that cause pain, surgery to repair the hernia is usually recommended. If a hernia becomes  strangulated, emergency surgery needs to be done right away. HOME CARE INSTRUCTIONS  Avoid putting pressure or strain on the abdominal area.  Avoid heavy lifting.  Use good body positioning for physical tasks. Ask your health care provider about proper body positioning.  Use a supportive belt as directed by your health care provider.  Maintain a healthy weight.  Eat foods that are high in fiber, such as whole grains, fruits, and vegetables. Fiber helps prevent difficult bowel movements (constipation).  Drink enough fluids to keep your urine clear or pale yellow.  Follow up with your health care provider as directed. SEEK MEDICAL CARE IF:   Your hernia seems to be getting larger or more painful. SEEK IMMEDIATE MEDICAL CARE IF:   You have abdominal pain that is sudden and sharp.  Your pain becomes severe.  You have repeated vomiting.  You are sweating a lot.  You notice a rapid heartbeat.  You develop a fever. MAKE SURE YOU:   Understand these instructions.  Will watch your condition.  Will get help right away if you are not doing well or get worse.   This information is not intended to replace advice given to you by your health care provider. Make sure you discuss any questions you have with your health care provider.   Document Released: 10/12/2012 Document Revised: 11/16/2014 Document Reviewed: 10/12/2012 Elsevier Interactive Patient Education 2016 [employer]  

## 2016-09-05 NOTE — Progress Notes (Signed)
Discharge home. Home discharge instruction given to Son, no queston verbalized.

## 2016-09-05 NOTE — Discharge Summary (Signed)
Patient ID: [name] [id number] 75 y.o. [birth year]  Admit date: 09/01/2016  Discharge date and time: 09/05/2016  Admitting Physician: Jimmye NormanWyatt,James  Discharge Physician: Ernestene MentionINGRAM,Shalayah Beagley M  Admission Diagnoses: Small bowel obstruction [K56.609] SBO (small bowel obstruction) [K56.609] Renal insufficiency [N28.9] Encounter for imaging study to confirm nasogastric (NG) tube placement [Z01.89] Normochromic normocytic anemia [D64.9] Incarcerated hernia [K46.0] Abdominal pain, unspecified abdominal location [R10.9]  Discharge Diagnoses: Partial small bowel obstruction, resolved                                         Ventral incisional hernia, reducible  Operations: None  Admission Condition: fair  Discharged Condition: good  Indication for Admission: Nausea and vomiting that started 08/30/16, seems to be getting worse.  She has had a hernia since her surgery, says it has gotten larger,      Complicated lady who underwent duodenal perforation and bleeding after sphincterotomy/ERCP for common bile duct stones. She and underwent exploratory laparotomy lysis of adhesions, kocherization of the duodenum, duodenal ostomy with repair of the duodenal perforation and bleeding opening cholecystectomy common bile duct exploration and placement of T-tube. 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      She was taken back to the operating room on 10/06/2010 at which time she underwent lysis of adhesions, right colectomy and ileostomy. She was hospitalized until 12/23/2010 and ultimately transferred to a skilled nursing facility where they can continue her TNA. She had to remain nothing by mouth secondary to the duodenal cutaneous fistula.She was eventually able to come off TNA, and T-tube was removed.  Patient does not remember the last time she saw Dr. Carolynne Edouardoth.     On exam in the ED on the day of admission she is having nausea and vomiting, pain and a  large 10 x 10 right lower abdomen hernia, with distension and a hard knot of bowel in it.  She says she has been having stool and emptied bag earlier this AM.   She is afebrile and vital signs are stable. Labs show a creatinine of 1.33 a glucose of 121. Normal troponin, and WBC of 8.7 normal hemoglobin, hematocrit, and platelet count. CT shows a right hemicolectomy, ileostomy in the left lower quadrant with parastomal hernia containing small bowel loops of bowel, interval development of a new hernia right lower quadrant anterior abdominal wall with a loop of incarcerated small bowel that does not appear to be obstructed obstruction appears to be more proximal in the small bowel. On ED exam we gave her something for nausea and pain. The right lower quadrant abdominal hernia was about 10 x 10 cm. You could feel a hard area of induration with in the hernia. With gentle massage over the next 10-15 minutes this reduced. With cough he returns but is easily reducible. She also has midline ventral hernia but no incarceration. The ileostomy appears intact, there is some fluid and gas in the bag.     She was admitted for pain control and management of bowel obstruction  Hospital Course: The patient was admitted on 09/01/2016 and discharged on 09/05/2017.  NG tube was placed.  Her right lower quadrant hernia was easily reducible.  She continued to have stool and flatus per ostomy bag.  The NG tube was discontinued and her diet was advanced without incident.  The right lower quadrant hernia would pop out occasionally  but without further pain and we easily reduced this.  We felt that the risk of surgery far outweighed the benefit at this point in time and since she was asymptomatic we decided to let her go home without any further surgical plans.    On the day of discharge the patient was comfortable in no distress.  Abdomen was soft and nontender.  Small right lower quadrant hernia defect easily reducible.  Diet and  activities were discussed.  No heavy lifting or strenuous activities.  Return to see Dr. Carolynne Edouardoth in the office if she has any further symptoms.  Consults: None  Significant Diagnostic Studies: Labs and x-rays  Treatments: IV hydration.  Nasogastric tube decompression.  Disposition: Home  Patient Instructions:    Medication List    TAKE these medications   albuterol 108 (90 Base) MCG/ACT inhaler Commonly known as:  PROVENTIL HFA;VENTOLIN HFA Inhale 1 puff into the lungs every 6 (six) hours as needed for wheezing or shortness of breath.   amLODipine 5 MG tablet Commonly known as:  NORVASC Take 5 mg by mouth daily.   cetirizine 10 MG tablet Commonly known as:  ZYRTEC Take 10 mg by mouth daily.   clopidogrel 75 MG tablet Commonly known as:  PLAVIX Take 75 mg by mouth daily.   esomeprazole 20 MG capsule Commonly known as:  NEXIUM Take 20 mg by mouth daily at 12 noon.   Fluticasone-Salmeterol 250-50 MCG/DOSE Aepb Commonly known as:  ADVAIR Inhale 1 puff into the lungs 2 (two) times daily.   metoCLOPramide 10 MG tablet Commonly known as:  REGLAN Take 10 mg by mouth at bedtime.   metoprolol tartrate 25 MG tablet Commonly known as:  LOPRESSOR Take 12.5 mg by mouth 2 (two) times daily.   nitroGLYCERIN 0.4 MG SL tablet Commonly known as:  NITROSTAT Place 0.4 mg under the tongue every 5 (five) minutes as needed for chest pain.   ranitidine 150 MG capsule Commonly known as:  ZANTAC Take 1 capsule (150 mg total) by mouth daily.   simvastatin 40 MG tablet Commonly known as:  ZOCOR Take 40 mg by mouth daily. What changed:  Another medication with the same name was removed. Continue taking this medication, and follow the directions you see here.   topiramate 25 MG tablet Commonly known as:  TOPAMAX Take 25 mg by mouth daily.       Activity: Ambulate as tolerated.  No heavy lifting for ever. Diet: low fat, low cholesterol diet Wound Care: none needed  Follow-up:   With Dr. Carolynne Edouardoth in As needed .  Signed: Angelia MouldHaywood M. Derrell LollingIngram, M.D., FACS General and minimally invasive surgery Breast and Colorectal Surgery  09/05/2016, 8:57 AM

## 2017-01-08 ENCOUNTER — Encounter (HOSPITAL_COMMUNITY): Payer: Self-pay | Admitting: Emergency Medicine

## 2017-01-08 ENCOUNTER — Inpatient Hospital Stay (HOSPITAL_COMMUNITY)
Admission: EM | Admit: 2017-01-08 | Discharge: 2017-01-11 | DRG: 394 | Disposition: A | Discharge status: Home / Self Care | Payer: Medicaid Other | Attending: Nephrology | Admitting: Nephrology

## 2017-01-08 DIAGNOSIS — Z79899 Other long term (current) drug therapy: Secondary | ICD-10-CM

## 2017-01-08 DIAGNOSIS — I252 Old myocardial infarction: Secondary | ICD-10-CM

## 2017-01-08 DIAGNOSIS — Y838 Other surgical procedures as the cause of abnormal reaction of the patient, or of later complication, without mention of misadventure at the time of the procedure: Secondary | ICD-10-CM | POA: Diagnosis present

## 2017-01-08 DIAGNOSIS — Z9049 Acquired absence of other specified parts of digestive tract: Secondary | ICD-10-CM

## 2017-01-08 DIAGNOSIS — I10 Essential (primary) hypertension: Secondary | ICD-10-CM | POA: Diagnosis present

## 2017-01-08 DIAGNOSIS — Z7902 Long term (current) use of antithrombotics/antiplatelets: Secondary | ICD-10-CM

## 2017-01-08 DIAGNOSIS — I2510 Atherosclerotic heart disease of native coronary artery without angina pectoris: Secondary | ICD-10-CM | POA: Diagnosis present

## 2017-01-08 DIAGNOSIS — I255 Ischemic cardiomyopathy: Secondary | ICD-10-CM | POA: Diagnosis present

## 2017-01-08 DIAGNOSIS — K433 Parastomal hernia with obstruction, without gangrene: Secondary | ICD-10-CM | POA: Diagnosis present

## 2017-01-08 DIAGNOSIS — K56609 Unspecified intestinal obstruction, unspecified as to partial versus complete obstruction: Secondary | ICD-10-CM

## 2017-01-08 DIAGNOSIS — I129 Hypertensive chronic kidney disease with stage 1 through stage 4 chronic kidney disease, or unspecified chronic kidney disease: Secondary | ICD-10-CM | POA: Diagnosis present

## 2017-01-08 DIAGNOSIS — K9413 Enterostomy malfunction: Principal | ICD-10-CM | POA: Diagnosis present

## 2017-01-08 DIAGNOSIS — D649 Anemia, unspecified: Secondary | ICD-10-CM | POA: Diagnosis present

## 2017-01-08 DIAGNOSIS — Z932 Ileostomy status: Secondary | ICD-10-CM

## 2017-01-08 DIAGNOSIS — N183 Chronic kidney disease, stage 3 unspecified: Secondary | ICD-10-CM | POA: Diagnosis present

## 2017-01-08 DIAGNOSIS — J45909 Unspecified asthma, uncomplicated: Secondary | ICD-10-CM | POA: Diagnosis present

## 2017-01-08 DIAGNOSIS — E1122 Type 2 diabetes mellitus with diabetic chronic kidney disease: Secondary | ICD-10-CM | POA: Diagnosis present

## 2017-01-08 DIAGNOSIS — Z0189 Encounter for other specified special examinations: Secondary | ICD-10-CM

## 2017-01-08 DIAGNOSIS — I480 Paroxysmal atrial fibrillation: Secondary | ICD-10-CM | POA: Diagnosis present

## 2017-01-08 DIAGNOSIS — D72829 Elevated white blood cell count, unspecified: Secondary | ICD-10-CM | POA: Diagnosis present

## 2017-01-08 DIAGNOSIS — E782 Mixed hyperlipidemia: Secondary | ICD-10-CM | POA: Diagnosis present

## 2017-01-08 DIAGNOSIS — E871 Hypo-osmolality and hyponatremia: Secondary | ICD-10-CM | POA: Diagnosis present

## 2017-01-08 LAB — COMPREHENSIVE METABOLIC PANEL
ALT: 21 U/L (ref 14–54)
ANION GAP: 10 (ref 5–15)
AST: 33 U/L (ref 15–41)
Albumin: 3.9 g/dL (ref 3.5–5.0)
Alkaline Phosphatase: 123 U/L (ref 38–126)
BUN: 11 mg/dL (ref 6–20)
CO2: 20 mmol/L — AB (ref 22–32)
CREATININE: 1.32 mg/dL — AB (ref 0.44–1.00)
Calcium: 9 mg/dL (ref 8.9–10.3)
Chloride: 103 mmol/L (ref 101–111)
GFR calc non Af Amer: 39 mL/min — ABNORMAL LOW (ref >60)
GFR, EST AFRICAN AMERICAN: 45 mL/min — AB (ref >60)
Glucose, Bld: 143 mg/dL — ABNORMAL HIGH (ref 65–99)
POTASSIUM: 4.7 mmol/L (ref 3.5–5.1)
SODIUM: 133 mmol/L — AB (ref 135–145)
Total Bilirubin: 0.8 mg/dL (ref 0.3–1.2)
Total Protein: 7.8 g/dL (ref 6.5–8.1)

## 2017-01-08 LAB — URINALYSIS, ROUTINE W REFLEX MICROSCOPIC
BILIRUBIN URINE: NEGATIVE
GLUCOSE, UA: NEGATIVE mg/dL
KETONES UR: NEGATIVE mg/dL
Nitrite: NEGATIVE
PH: 5 (ref 5.0–8.0)
PROTEIN: NEGATIVE mg/dL
SQUAMOUS EPITHELIAL / LPF: NONE SEEN
Specific Gravity, Urine: 1.012 (ref 1.005–1.030)

## 2017-01-08 LAB — CBC
HCT: 36.1 % (ref 36.0–46.0)
HEMOGLOBIN: 11.4 g/dL — AB (ref 12.0–15.0)
MCH: 26.7 pg (ref 26.0–34.0)
MCHC: 31.6 g/dL (ref 30.0–36.0)
MCV: 84.5 fL (ref 78.0–100.0)
PLATELETS: 235 10*3/uL (ref 150–400)
RBC: 4.27 MIL/uL (ref 3.87–5.11)
RDW: 14 % (ref 11.5–15.5)
WBC: 12.9 10*3/uL — AB (ref 4.0–10.5)

## 2017-01-08 LAB — LIPASE, BLOOD: LIPASE: 32 U/L (ref 11–51)

## 2017-01-08 MED ORDER — MORPHINE SULFATE (PF) 4 MG/ML IV SOLN
4.0000 mg | Freq: Once | INTRAVENOUS | Status: AC
  Administered 2017-01-08: 4 mg via INTRAVENOUS
  Filled 2017-01-08: qty 1

## 2017-01-08 MED ORDER — SODIUM CHLORIDE 0.9 % IV BOLUS (SEPSIS)
1000.0000 mL | Freq: Once | INTRAVENOUS | Status: AC
  Administered 2017-01-08: 1000 mL via INTRAVENOUS

## 2017-01-08 MED ORDER — ONDANSETRON HCL 4 MG/2ML IJ SOLN
4.0000 mg | Freq: Once | INTRAMUSCULAR | Status: AC
  Administered 2017-01-08: 4 mg via INTRAVENOUS
  Filled 2017-01-08: qty 2

## 2017-01-08 NOTE — ED Provider Notes (Signed)
[facility]-EMERGENCY DEPT Provider Note   CSN: [id number] Arrival date & time: 01/08/17  1945   By signing my name below, I, Clovis Pu, attest that this documentation has been prepared under the direction and in the presence of Geoffery Lyons, MD  Electronically Signed: Clovis Pu, ED Scribe. 01/08/17. 11:23 PM.   History   Chief Complaint Chief Complaint  Patient presents with  . Abdominal Pain    colostomy issues   The history is provided by a relative. No language interpreter was used.  Abdominal Pain   This is a new problem. The current episode started 12 to 24 hours ago. The problem occurs constantly. The problem has not changed since onset.The pain is associated with an unknown factor. The pain is moderate. Associated symptoms include nausea. Pertinent negatives include fever and vomiting. The symptoms are aggravated by palpation. Nothing relieves the symptoms.   HPI Comments:  [name] is a 76 y.o. female, with a hx of paroxysmal atrial fibrillation, HTN, hyperlipidemia and small bowel obstruction, who presents to the Emergency Department complaining of acute onset, moderate abdominal pain with associated abdominal swelling onset today. Pt has an ostomy bag in place and relative reports nothing but liquid is collecting in the bag. Relative also reports nausea and a hx of similar symptoms. No alleviating factors noted. Son denies fevers, vomiting or any other associated symptoms. No other complaints noted.   Past Medical History:  Diagnosis Date  . Acute gastric ulcer with bleeding 11/09/2013  . Asthma   . NSTEMI (non-ST elevated myocardial infarction) (HCC) 2011   medically managed  . Paroxysmal atrial fibrillation (HCC)    Hx of amiodarone use    Patient Active Problem List   Diagnosis Date Noted  . SBO (small bowel obstruction) 09/01/2016  . AKI (acute kidney injury) (HCC) 11/29/2015  . Gastroenteritis 11/29/2015  . Epigastric pain   . Partial small bowel obstruction  08/23/2014  . Acute gastric ulcer with bleeding 11/09/2013  . Acute posthemorrhagic anemia 11/09/2013  . 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 ANEMIA 07/23/2010  . Essential hypertension, benign 07/23/2010  . ISCHEMIC CARDIOMYOPATHY 07/23/2010  . HYPERLIPIDEMIA-MIXED 07/17/2010  . Coronary atherosclerosis 07/17/2010  . Atrial fibrillation (HCC) 07/17/2010  . RENAL INSUFFICIENCY 07/17/2010  . Asthma 07/15/2010    Past Surgical History:  Procedure Laterality Date  . 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 COLOSTOMY Left 2011   Dr. Carolynne Edouard  . ERCP  2011  . ESOPHAGOGASTRODUODENOSCOPY N/A 11/08/2013   Procedure: ESOPHAGOGASTRODUODENOSCOPY (EGD);  Surgeon: Theda Belfast, MD;  Location: [facility] ENDOSCOPY;  Service: Endoscopy;  Laterality: N/A;  . ESOPHAGOGASTRODUODENOSCOPY N/A 08/28/2014   Procedure: ESOPHAGOGASTRODUODENOSCOPY (EGD);  Surgeon: Barrie Folk, MD;  Location: [facility] ENDOSCOPY;  Service: Endoscopy;  Laterality: N/A;  . LAPAROSCOPIC CHOLECYSTECTOMY  2011  . PARTIAL COLECTOMY  2011   necrotic bowel after lap chole    OB History    No data available       Home Medications    Prior to Admission medications   Medication Sig Start Date End Date Taking? Authorizing Provider  albuterol (PROVENTIL HFA;VENTOLIN HFA) 108 (90 BASE) MCG/ACT inhaler Inhale 1 puff into the lungs every 6 (six) hours as needed for wheezing or shortness of breath.    Historical Provider, MD  amLODipine (NORVASC) 5 MG tablet Take 5 mg by mouth daily.    Historical Provider, MD  cetirizine (ZYRTEC) 10 MG tablet Take 10 mg by  mouth daily.    Historical Provider, MD  clopidogrel (PLAVIX) 75 MG tablet Take 75 mg by mouth daily.    Historical Provider, MD  esomeprazole (NEXIUM) 20 MG capsule Take 20 mg by mouth daily at 12 noon.    Historical Provider, MD  Fluticasone-Salmeterol (ADVAIR) 250-50 MCG/DOSE AEPB Inhale 1 puff into the lungs 2 (two) times daily.     Historical Provider, MD  metoCLOPramide (REGLAN) 10 MG tablet Take 10 mg by mouth at bedtime.    Historical Provider, MD  metoprolol tartrate (LOPRESSOR) 25 MG tablet Take 12.5 mg by mouth 2 (two) times daily.    Historical Provider, MD  nitroGLYCERIN (NITROSTAT) 0.4 MG SL tablet Place 0.4 mg under the tongue every 5 (five) minutes as needed for chest pain.    Historical Provider, MD  ranitidine (ZANTAC) 150 MG capsule Take 1 capsule (150 mg total) by mouth daily. Patient not taking: Reported on 09/01/2016 12/04/15   Tomasita CrumbleAdeleke Oni, MD  simvastatin (ZOCOR) 40 MG tablet Take 40 mg by mouth daily.    Historical Provider, MD  topiramate (TOPAMAX) 25 MG tablet Take 25 mg by mouth daily.    Historical Provider, MD    Family History No family history on file.  Social History Social History  Substance Use Topics  . Smoking status: Never Smoker  . Smokeless tobacco: Never Used  . Alcohol use No     Allergies   Patient has no known allergies.   Review of Systems Review of Systems  Constitutional: Negative for fever.  Gastrointestinal: Positive for abdominal pain and nausea. Negative for vomiting.  All other systems reviewed and are negative.  Physical Exam Updated Vital Signs BP 151/82 (BP Location: Left Arm)   Pulse 72   Temp 98.2 F (36.8 C) (Oral)   Resp 18   SpO2 98%   Physical Exam  Constitutional: She is oriented to person, place, and time. She appears well-developed and well-nourished. No distress.  HENT:  Head: Normocephalic and atraumatic.  Eyes: EOM are normal.  Neck: Normal range of motion.  Cardiovascular: Normal rate, regular rhythm and normal heart sounds.   Pulmonary/Chest: Effort normal and breath sounds normal.  Abdominal: Soft. She exhibits distension. There is tenderness. A hernia is present. Hernia confirmed positive in the ventral area.  Abdomen is moderately distended and tender in all 4 quadrants. Ostomy is in place and appears clean and well maintained.  Minimal liquid in the bag. Surgical scars present in mid abdomen with ventral hernia palpable.   Musculoskeletal: Normal range of motion.  Neurological: She is alert and oriented to person, place, and time.  Skin: Skin is warm and dry.  Psychiatric: She has a normal mood and affect. Judgment normal.  Nursing note and vitals reviewed.  ED Treatments / Results  DIAGNOSTIC STUDIES:  Oxygen Saturation is 98% on RA, normal by my interpretation.    COORDINATION OF CARE:  11:08 PM Discussed treatment plan with pt at bedside and pt agreed to plan.  Labs (all labs ordered are listed, but only abnormal results are displayed) Labs Reviewed  COMPREHENSIVE METABOLIC PANEL - Abnormal; Notable for the following:       Result Value   Sodium 133 (*)    CO2 20 (*)    Glucose, Bld 143 (*)    Creatinine, Ser 1.32 (*)    GFR calc non Af Amer 39 (*)    GFR calc Af Amer 45 (*)    All other components within normal limits  CBC -  Abnormal; Notable for the following:    WBC 12.9 (*)    Hemoglobin 11.4 (*)    All other components within normal limits  URINALYSIS, ROUTINE W REFLEX MICROSCOPIC - Abnormal; Notable for the following:    Hgb urine dipstick SMALL (*)    Leukocytes, UA TRACE (*)    Bacteria, UA RARE (*)    All other components within normal limits  LIPASE, BLOOD    EKG  EKG Interpretation None       Radiology No results found.  Procedures Procedures (including critical care time)  Medications Ordered in ED Medications - No data to display   Initial Impression / Assessment and Plan / ED Course  I have reviewed the triage vital signs and the nursing notes.  Pertinent labs & imaging results that were available during my care of the patient were reviewed by me and considered in my medical decision making (see chart for details).  Workup reveals a small bowel obstruction. An NG tube will be placed. I have spoken with Dr. Doylene Canard from general surgery who is requesting admission  by the hospitalist. Dr. Antionette Char agrees to admit.  Final Clinical Impressions(s) / ED Diagnoses   Final diagnoses:  None    New Prescriptions New Prescriptions   No medications on file  I personally performed the services described in this documentation, which was scribed in my presence. The recorded information has been reviewed and is accurate.        Geoffery Lyons, MD 01/09/17 [phone]

## 2017-01-08 NOTE — ED Notes (Signed)
Son up to pod A nurses desk inquiring if mother can have pain medicine in waiting room. Pt just assigned to room, will move and treat patient's pain. Son notified of this, apologized for delay and pt's pain.

## 2017-01-08 NOTE — ED Triage Notes (Addendum)
Pt to ED c/o abd pain starting today accompanied by nausea. Pt has colostomy and states that she feels constipated - "it's not coming out like it should - last time emptied was this morning." No edema or redness noted around colostomy site. Denies vomiting/fevers.

## 2017-01-09 ENCOUNTER — Emergency Department (HOSPITAL_COMMUNITY): Payer: Medicaid Other

## 2017-01-09 ENCOUNTER — Encounter (HOSPITAL_COMMUNITY): Payer: Self-pay | Admitting: Family Medicine

## 2017-01-09 DIAGNOSIS — Z7902 Long term (current) use of antithrombotics/antiplatelets: Secondary | ICD-10-CM | POA: Diagnosis not present

## 2017-01-09 DIAGNOSIS — Z79899 Other long term (current) drug therapy: Secondary | ICD-10-CM | POA: Diagnosis not present

## 2017-01-09 DIAGNOSIS — D649 Anemia, unspecified: Secondary | ICD-10-CM

## 2017-01-09 DIAGNOSIS — I255 Ischemic cardiomyopathy: Secondary | ICD-10-CM | POA: Diagnosis present

## 2017-01-09 DIAGNOSIS — Y838 Other surgical procedures as the cause of abnormal reaction of the patient, or of later complication, without mention of misadventure at the time of the procedure: Secondary | ICD-10-CM | POA: Diagnosis present

## 2017-01-09 DIAGNOSIS — N183 Chronic kidney disease, stage 3 (moderate): Secondary | ICD-10-CM | POA: Diagnosis not present

## 2017-01-09 DIAGNOSIS — I129 Hypertensive chronic kidney disease with stage 1 through stage 4 chronic kidney disease, or unspecified chronic kidney disease: Secondary | ICD-10-CM | POA: Diagnosis present

## 2017-01-09 DIAGNOSIS — Z9049 Acquired absence of other specified parts of digestive tract: Secondary | ICD-10-CM | POA: Diagnosis not present

## 2017-01-09 DIAGNOSIS — J4540 Moderate persistent asthma, uncomplicated: Secondary | ICD-10-CM

## 2017-01-09 DIAGNOSIS — I2510 Atherosclerotic heart disease of native coronary artery without angina pectoris: Secondary | ICD-10-CM

## 2017-01-09 DIAGNOSIS — I480 Paroxysmal atrial fibrillation: Secondary | ICD-10-CM | POA: Diagnosis present

## 2017-01-09 DIAGNOSIS — Z932 Ileostomy status: Secondary | ICD-10-CM

## 2017-01-09 DIAGNOSIS — D72829 Elevated white blood cell count, unspecified: Secondary | ICD-10-CM | POA: Diagnosis present

## 2017-01-09 DIAGNOSIS — E782 Mixed hyperlipidemia: Secondary | ICD-10-CM | POA: Diagnosis present

## 2017-01-09 DIAGNOSIS — K9413 Enterostomy malfunction: Secondary | ICD-10-CM | POA: Diagnosis present

## 2017-01-09 DIAGNOSIS — K56609 Unspecified intestinal obstruction, unspecified as to partial versus complete obstruction: Secondary | ICD-10-CM

## 2017-01-09 DIAGNOSIS — I10 Essential (primary) hypertension: Secondary | ICD-10-CM

## 2017-01-09 DIAGNOSIS — E871 Hypo-osmolality and hyponatremia: Secondary | ICD-10-CM | POA: Diagnosis present

## 2017-01-09 DIAGNOSIS — K433 Parastomal hernia with obstruction, without gangrene: Secondary | ICD-10-CM | POA: Diagnosis present

## 2017-01-09 DIAGNOSIS — I252 Old myocardial infarction: Secondary | ICD-10-CM | POA: Diagnosis not present

## 2017-01-09 DIAGNOSIS — E1122 Type 2 diabetes mellitus with diabetic chronic kidney disease: Secondary | ICD-10-CM | POA: Diagnosis present

## 2017-01-09 LAB — GLUCOSE, CAPILLARY: GLUCOSE-CAPILLARY: 92 mg/dL (ref 65–99)

## 2017-01-09 LAB — LACTIC ACID, PLASMA: LACTIC ACID, VENOUS: 1.7 mmol/L (ref 0.5–1.9)

## 2017-01-09 MED ORDER — ENOXAPARIN SODIUM 30 MG/0.3ML ~~LOC~~ SOLN
30.0000 mg | Freq: Every day | SUBCUTANEOUS | Status: DC
  Administered 2017-01-09 – 2017-01-11 (×3): 30 mg via SUBCUTANEOUS
  Filled 2017-01-09 (×4): qty 0.3

## 2017-01-09 MED ORDER — SODIUM CHLORIDE 0.9 % IV SOLN
INTRAVENOUS | Status: DC
  Administered 2017-01-09: 08:00:00 via INTRAVENOUS

## 2017-01-09 MED ORDER — PANTOPRAZOLE SODIUM 40 MG PO TBEC
40.0000 mg | DELAYED_RELEASE_TABLET | Freq: Every day | ORAL | Status: DC
  Filled 2017-01-09: qty 1

## 2017-01-09 MED ORDER — SIMVASTATIN 20 MG PO TABS
20.0000 mg | ORAL_TABLET | Freq: Every day | ORAL | Status: DC
  Administered 2017-01-10 – 2017-01-11 (×2): 20 mg via ORAL
  Filled 2017-01-09 (×2): qty 1

## 2017-01-09 MED ORDER — METOCLOPRAMIDE HCL 10 MG PO TABS
10.0000 mg | ORAL_TABLET | Freq: Every day | ORAL | Status: DC
  Administered 2017-01-09: 10 mg via ORAL
  Filled 2017-01-09 (×2): qty 1

## 2017-01-09 MED ORDER — AMLODIPINE BESYLATE 5 MG PO TABS: 5.0000 mg | ORAL_TABLET | Freq: Every day | ORAL | Status: DC

## 2017-01-09 MED ORDER — CLOPIDOGREL BISULFATE 75 MG PO TABS
75.0000 mg | ORAL_TABLET | Freq: Every day | ORAL | Status: DC
  Administered 2017-01-10 – 2017-01-11 (×2): 75 mg via ORAL
  Filled 2017-01-09 (×2): qty 1

## 2017-01-09 MED ORDER — SODIUM CHLORIDE 0.9 % IV SOLN: INTRAVENOUS | Status: DC

## 2017-01-09 MED ORDER — ALBUTEROL SULFATE (2.5 MG/3ML) 0.083% IN NEBU
2.5000 mg | INHALATION_SOLUTION | Freq: Four times a day (QID) | RESPIRATORY_TRACT | Status: DC | PRN
Start: 2017-01-09 — End: 2017-01-11
  Administered 2017-01-10: 2.5 mg via RESPIRATORY_TRACT
  Filled 2017-01-09: qty 3

## 2017-01-09 MED ORDER — MOMETASONE FURO-FORMOTEROL FUM 200-5 MCG/ACT IN AERO
2.0000 | INHALATION_SPRAY | Freq: Two times a day (BID) | RESPIRATORY_TRACT | Status: DC
  Administered 2017-01-09 – 2017-01-11 (×3): 2 via RESPIRATORY_TRACT
  Filled 2017-01-09: qty 8.8

## 2017-01-09 MED ORDER — TOPIRAMATE 25 MG PO TABS
25.0000 mg | ORAL_TABLET | Freq: Every day | ORAL | Status: DC
  Administered 2017-01-10 – 2017-01-11 (×2): 25 mg via ORAL
  Filled 2017-01-09 (×2): qty 1

## 2017-01-09 MED ORDER — PANTOPRAZOLE SODIUM 40 MG IV SOLR
40.0000 mg | INTRAVENOUS | Status: DC
  Administered 2017-01-09 – 2017-01-10 (×2): 40 mg via INTRAVENOUS
  Filled 2017-01-09 (×2): qty 40

## 2017-01-09 MED ORDER — PROMETHAZINE HCL 25 MG PO TABS: 12.5000 mg | ORAL_TABLET | Freq: Four times a day (QID) | ORAL | Status: DC | PRN

## 2017-01-09 MED ORDER — DEXTROSE-NACL 5-0.9 % IV SOLN
INTRAVENOUS | Status: DC
  Administered 2017-01-09: 14:00:00 via INTRAVENOUS
  Administered 2017-01-09: 100 mL/h via INTRAVENOUS

## 2017-01-09 MED ORDER — ACETAMINOPHEN 325 MG PO TABS: 650.0000 mg | ORAL_TABLET | Freq: Four times a day (QID) | ORAL | Status: DC | PRN

## 2017-01-09 MED ORDER — METOPROLOL TARTRATE 12.5 MG HALF TABLET
12.5000 mg | ORAL_TABLET | Freq: Two times a day (BID) | ORAL | Status: DC
  Filled 2017-01-09: qty 1

## 2017-01-09 MED ORDER — PANTOPRAZOLE SODIUM 40 MG IV SOLR: 40.0000 mg | Freq: Two times a day (BID) | INTRAVENOUS | Status: DC

## 2017-01-09 MED ORDER — HYDROMORPHONE HCL 2 MG/ML IJ SOLN: 0.5000 mg | INTRAMUSCULAR | Status: AC | PRN

## 2017-01-09 MED ORDER — LORATADINE 10 MG PO TABS
10.0000 mg | ORAL_TABLET | Freq: Every day | ORAL | Status: DC
  Administered 2017-01-10 – 2017-01-11 (×2): 10 mg via ORAL
  Filled 2017-01-09 (×3): qty 1

## 2017-01-09 MED ORDER — IOPAMIDOL (ISOVUE-300) INJECTION 61%
80.0000 mL | Freq: Once | INTRAVENOUS | Status: AC | PRN
  Administered 2017-01-09: 80 mL via INTRAVENOUS

## 2017-01-09 MED ORDER — ONDANSETRON HCL 4 MG/2ML IJ SOLN: 4.0000 mg | Freq: Four times a day (QID) | INTRAMUSCULAR | Status: AC | PRN

## 2017-01-09 MED ORDER — ACETAMINOPHEN 650 MG RE SUPP: 650.0000 mg | Freq: Four times a day (QID) | RECTAL | Status: DC | PRN

## 2017-01-09 MED ORDER — DIATRIZOATE MEGLUMINE & SODIUM 66-10 % PO SOLN: 90.0000 mL | Freq: Once | ORAL | Status: DC

## 2017-01-09 MED ORDER — HYDRALAZINE HCL 20 MG/ML IJ SOLN: 5.0000 mg | INTRAMUSCULAR | Status: DC | PRN

## 2017-01-09 NOTE — ED Notes (Signed)
Meds ordered from Pharm

## 2017-01-09 NOTE — ED Notes (Signed)
Pt colostomy bag draining liquid stool.  States when her colostomy drains it is no longer painful.

## 2017-01-09 NOTE — ED Notes (Signed)
Pt to CT via stretcher

## 2017-01-09 NOTE — ED Notes (Signed)
Son [name] [phone]

## 2017-01-09 NOTE — H&P (Signed)
History and Physical    [name] GDJ:[id number] DOB: August 03, 1941 DOA: 01/08/2017  PCP: Lonia Blood, MD   Patient coming from: Home  Chief Complaint: Abd pain, N/V  HPI: [name] is a 76 y.o. female with medical history significant for asthma, coronary artery disease, hypertension, and remote history of duodenal perforation and partial colectomy, now presenting to the emergency department with severe abdominal pain adjacent to the ostomy site with marked decrease in ostomy output, nausea, and nonbloody nonbilious vomiting. History is obtained using a Nepali-speaking telephonic interpreter.  Patient had been in her usual state of health until earlier today when she developed pain around her ostomy. She noted that date had not been filling as usual. She also noted her ventral hernia, and parastomal hernias to be firm and tender. She denies any recent fevers or chills and denies any bloody output in her colostomy. She describes her pain as severe, sharp, localized, constant, without alleviating or exacerbating factors, and associated with nausea and vomiting. She has had similar symptoms previously with SBO secondary to parastomal hernia, which resolved with conservative management.  ED Course: Upon arrival to the ED, patient is found to be afebrile, saturating well on room air, mildly hypertensive, and with vitals otherwise stable. Chemistry panels notable for a mild hyponatremia and serum creatinine 1.32, only negligibly increased from baseline. CBC is notable for leukocytosis to 12,900 and a stable normocytic anemia with hemoglobin of 11.4. CT of the abdomen and pelvis features a left lower quadrant ileostomy with peristomal hernia. On CT, the bowel loops in the parastomal hernia, as well as in the right ventral wall hernia appeared to be obstructed and edematous. Patient was given a liter of normal saline, IV morphine, and diabetes Zofran in the ED. Surgery was consulted by the ED physician and  request for medical admission. Patient has remained hemodynamically stable in the ED, in no apparent respiratory distress, and will be admitted to the medical-surgical unit for ongoing evaluation and management of bowel obstruction secondary to a parastomal hernia.  Review of Systems:  All other systems reviewed and apart from HPI, are negative.  Past Medical History:  Diagnosis Date  . Acute gastric ulcer with bleeding 11/09/2013  . Asthma   . NSTEMI (non-ST elevated myocardial infarction) (HCC) 2011   medically managed  . Paroxysmal atrial fibrillation (HCC)    Hx of amiodarone use    Past Surgical History:  Procedure Laterality Date  . 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 COLOSTOMY Left 2011   Dr. Carolynne Edouard  . ERCP  2011  . ESOPHAGOGASTRODUODENOSCOPY N/A 11/08/2013   Procedure: ESOPHAGOGASTRODUODENOSCOPY (EGD);  Surgeon: Theda Belfast, MD;  Location: [facility] ENDOSCOPY;  Service: Endoscopy;  Laterality: N/A;  . ESOPHAGOGASTRODUODENOSCOPY N/A 08/28/2014   Procedure: ESOPHAGOGASTRODUODENOSCOPY (EGD);  Surgeon: Barrie Folk, MD;  Location: [facility] ENDOSCOPY;  Service: Endoscopy;  Laterality: N/A;  . LAPAROSCOPIC CHOLECYSTECTOMY  2011  . PARTIAL COLECTOMY  2011   necrotic bowel after lap chole     reports that she has never smoked. She has never used smokeless tobacco. She reports that she does not drink alcohol or use drugs.  No Known Allergies  History reviewed. No pertinent family history.   Prior to Admission medications   Medication Sig Start Date End Date Taking? Authorizing Provider  albuterol (PROVENTIL HFA;VENTOLIN HFA) 108 (90 BASE) MCG/ACT inhaler Inhale 1 puff into the lungs every 6 (six) hours as needed for wheezing  or shortness of breath.    Historical Provider, MD  amLODipine (NORVASC) 5 MG tablet Take 5 mg by mouth daily.    Historical Provider, MD  cetirizine (ZYRTEC) 10 MG tablet Take 10 mg by mouth daily.     Historical Provider, MD  clopidogrel (PLAVIX) 75 MG tablet Take 75 mg by mouth daily.    Historical Provider, MD  esomeprazole (NEXIUM) 20 MG capsule Take 20 mg by mouth daily at 12 noon.    Historical Provider, MD  Fluticasone-Salmeterol (ADVAIR) 250-50 MCG/DOSE AEPB Inhale 1 puff into the lungs 2 (two) times daily.    Historical Provider, MD  metoCLOPramide (REGLAN) 10 MG tablet Take 10 mg by mouth at bedtime.    Historical Provider, MD  metoprolol tartrate (LOPRESSOR) 25 MG tablet Take 12.5 mg by mouth 2 (two) times daily.    Historical Provider, MD  nitroGLYCERIN (NITROSTAT) 0.4 MG SL tablet Place 0.4 mg under the tongue every 5 (five) minutes as needed for chest pain.    Historical Provider, MD  ranitidine (ZANTAC) 150 MG capsule Take 1 capsule (150 mg total) by mouth daily. Patient not taking: Reported on 09/01/2016 12/04/15   Tomasita Crumble, MD  simvastatin (ZOCOR) 40 MG tablet Take 40 mg by mouth daily.    Historical Provider, MD  topiramate (TOPAMAX) 25 MG tablet Take 25 mg by mouth daily.    Historical Provider, MD    Physical Exam: Vitals:   01/08/17 2308 01/08/17 2345 01/09/17 0045 01/09/17 0200  BP: 143/82 148/73 141/71 114/64  Pulse: 68 79 81 95  Resp:      Temp:      TempSrc:      SpO2: 99% 94% 97% 99%      Constitutional: NAD, calm, comfortable Eyes: PERTLA, lids and conjunctivae normal ENMT: Mucous membranes are moist. Posterior pharynx clear of any exudate or lesions.   Neck: normal, supple, no masses, no thyromegaly Respiratory: clear to auscultation bilaterally, no wheezing, no crackles. Normal respiratory effort.   Cardiovascular: S1 & S2 heard, regular rate and rhythm. No extremity edema. No significant JVD. Abdomen: No distension, ventral and parastomal hernias soft, but tender without overlying erythema; scan tan stool in colostomy with no visible blood. Bowel sounds appreciated.  Musculoskeletal: no clubbing / cyanosis. No joint deformity upper and lower  extremities. Normal muscle tone.  Skin: no significant rashes, lesions, ulcers. Warm, dry, well-perfused. Neurologic: CN 2-12 grossly intact. Sensation intact, DTR normal. Strength 5/5 in all 4 limbs.  Psychiatric: Normal judgment and insight. Alert and oriented x 3. Normal mood and affect.     Labs on Admission: I have personally reviewed following labs and imaging studies  CBC:  Recent Labs Lab 01/08/17 2001  WBC 12.9*  HGB 11.4*  HCT 36.1  MCV 84.5  PLT 235   Basic Metabolic Panel:  Recent Labs Lab 01/08/17 2001  NA 133*  K 4.7  CL 103  CO2 20*  GLUCOSE 143*  BUN 11  CREATININE 1.32*  CALCIUM 9.0   GFR: CrCl cannot be calculated (Unknown ideal weight.). Liver Function Tests:  Recent Labs Lab 01/08/17 2001  AST 33  ALT 21  ALKPHOS 123  BILITOT 0.8  PROT 7.8  ALBUMIN 3.9    Recent Labs Lab 01/08/17 2001  LIPASE 32   No results for input(s): AMMONIA in the last 168 hours. Coagulation Profile: No results for input(s): INR, PROTIME in the last 168 hours. Cardiac Enzymes: No results for input(s): CKTOTAL, CKMB, CKMBINDEX, TROPONINI in the last  168 hours. BNP (last 3 results) No results for input(s): PROBNP in the last 8760 hours. HbA1C: No results for input(s): HGBA1C in the last 72 hours. CBG: No results for input(s): GLUCAP in the last 168 hours. Lipid Profile: No results for input(s): CHOL, HDL, LDLCALC, TRIG, CHOLHDL, LDLDIRECT in the last 72 hours. Thyroid Function Tests: No results for input(s): TSH, T4TOTAL, FREET4, T3FREE, THYROIDAB in the last 72 hours. Anemia Panel: No results for input(s): VITAMINB12, FOLATE, FERRITIN, TIBC, IRON, RETICCTPCT in the last 72 hours. Urine analysis:    Component Value Date/Time   COLORURINE YELLOW 01/08/2017 2007   APPEARANCEUR CLEAR 01/08/2017 2007   LABSPEC 1.012 01/08/2017 2007   PHURINE 5.0 01/08/2017 2007   GLUCOSEU NEGATIVE 01/08/2017 2007   HGBUR SMALL (A) 01/08/2017 2007   BILIRUBINUR  NEGATIVE 01/08/2017 2007   KETONESUR NEGATIVE 01/08/2017 2007   PROTEINUR NEGATIVE 01/08/2017 2007   UROBILINOGEN 0.2 08/24/2014 2016   NITRITE NEGATIVE 01/08/2017 2007   LEUKOCYTESUR TRACE (A) 01/08/2017 2007   Sepsis Labs: @LABRCNTIP (procalcitonin:4,lacticidven:4) )No results found for this or any previous visit (from the past 240 hour(s)).   Radiological Exams on Admission: Ct Abdomen Pelvis W Contrast  Result Date: 01/09/2017 CLINICAL DATA:  Abdominal pain and distension. EXAM: CT ABDOMEN AND PELVIS WITH CONTRAST TECHNIQUE: Multidetector CT imaging of the abdomen and pelvis was performed using the standard protocol following bolus administration of intravenous contrast. CONTRAST:  80mL ISOVUE-300 IOPAMIDOL (ISOVUE-300) INJECTION 61% COMPARISON:  CT 09/01/2016 FINDINGS: Lower chest: Previous nodular opacities at the left lung base are not visualized. There is minimal paramediastinal atelectasis. Hepatobiliary: No focal hepatic lesion. Postcholecystectomy with unchanged biliary dilatation, common bile duct measures 14 mm just distal to the porta hepatis. No calcified choledocholithiasis. Pancreas: No ductal dilatation or inflammation. Spleen: Normal in size without focal abnormality. Adrenals/Urinary Tract: Normal adrenal glands. Symmetric renal enhancement and excretion on delayed phase imaging. Small cortical cyst in the posterior right kidney, unchanged. No hydronephrosis. Urinary bladder is physiologically distended. Stomach/Bowel: Mild submucosal fatty infiltration of the greater curvature of stomach. Stomach is distended with ingested contents. 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 There is fecalization of the herniated contents with adjacent edema and free fluid in the hernia sac. The small bowel just proximal to the parastomal hernia is also dilated with fecalized contents, including small bowel within an adjacent right lower quadrant anterior abdominal wall hernia. There is  mesenteric edema involving the abnormal small bowel with minimal free fluid. The more proximal small bowel is normal. Post right hemicolectomy with decompressed distal colon. There is no pneumatosis or free air. Vascular/Lymphatic: Abdominal aortic atherosclerosis without aneurysm. No adenopathy. Reproductive: There is prominent periuterine vascularity and dilatation of the gonadal veins, left ovarian vein measures 6 mm. Other: No free air. Musculoskeletal: There are no acute or suspicious osseous abnormalities. IMPRESSION: 1. 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 Bowel loops in the parastomal hernia as well as adjacent right ventral abdominal wall hernia appear obstructed and edematous. Point of obstruction appears within the hernia sac. 2. Additional stable chronic findings include postcholecystectomy biliary dilatation, submucosal fatty infiltration of the stomach, and abdominal atherosclerosis. 3. The previous left lung base nodular opacities are not seen, and may have resolved. Electronically Signed   By: Rubye OaksMelanie  Ehinger M.D.   On: 01/09/2017 01:41    EKG: Not performed, will obtain as appropriate.   Assessment/Plan  1. Parastomal hernia with obstruction  - Pt with complex surgical hx, presenting with pain at site of colostomy and  ventral hernia; very little colostomy output - CT abd/pelvis suggests obstruction with transition in the hernia; bowel loops within the hernias are edematous  - Surgery is consulting and much appreciated  - Plan for decompression, bowel rest, prn anti-emetics and analgesia, serial exams    2. CAD   - No anginal complaints  - Continue Plavix, statin, Lopressor   3. Hypertension - BP at goal  - Continue Lopressor with holding paramaters   4. CKD stage III  - SCr is 1.32 on admission, appears consistent with baseline  - Avoid nephrotoxins where possible, renally-dose medications as needed  - Providing a gentle IVF hydration while on bowel rest     5. Asthma - Stable on admission   - Continue scheduled ICS/LABA with Dulera, prn albuterol   6. Normocytic anemia  - Hgb is stable at 11.4 without sign of bleeding  - Likely secondary to CKD  - Monitor periodic CBC   7. Leukocytosis  - WBC is 12,900 on admission  - No fevers, urinary or respiratory sxs  - Likely reactive to the obstruction, will culture if febrile     DVT prophylaxis: sq Lovenox  Code Status: Full  Family Communication: Discussed with patient Disposition Plan: Admit to med-surg Consults called: Surgery Admission status: Inpatient    Briscoe Deutscher, MD [facility] Pager [phone]  If 7PM-7AM, please contact night-coverage [email or web address] Password [email or web address]  01/09/2017, 3:08 AM

## 2017-01-09 NOTE — Consult Note (Signed)
Surgical Consultation Requesting provider: Dr. 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 She has had incisional hernia and parastomal hernia for years. Earlier today she began to experience abdominal pain associated with nausea along with a decrease in ileostomy output. No emesis or fevers.    No Known Allergies  Past Medical History:  Diagnosis Date  . Acute gastric ulcer with bleeding 11/09/2013  . Asthma   . NSTEMI (non-ST elevated myocardial infarction) (HCC) 2011   medically managed  . Paroxysmal atrial fibrillation (HCC)    Hx of amiodarone use    Past Surgical History:  Procedure Laterality Date  . 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 COLOSTOMY Left 2011   Dr. Carolynne Edouardoth  . ERCP  2011  . ESOPHAGOGASTRODUODENOSCOPY N/A 11/08/2013   Procedure: ESOPHAGOGASTRODUODENOSCOPY (EGD);  Surgeon: Theda BelfastPatrick D Hung, MD;  [facility];  Service: Endoscopy;  Laterality: N/A;  . ESOPHAGOGASTRODUODENOSCOPY N/A 08/28/2014   Procedure: ESOPHAGOGASTRODUODENOSCOPY (EGD);  Surgeon: Barrie FolkJohn C Hayes, MD;  [facility];  Service: Endoscopy;  Laterality: N/A;  . LAPAROSCOPIC CHOLECYSTECTOMY  2011  . PARTIAL COLECTOMY  2011   necrotic bowel after lap chole    History reviewed. No pertinent family history.  Social History   Social History  . Marital status: Married    Spouse name: N/A  . Number of children: N/A  . Years of  education: N/A   Social History Main Topics  . Smoking status: Never Smoker  . Smokeless tobacco: Never Used  . Alcohol use No  . Drug use: No  . Sexual activity: Not Asked   Other Topics Concern  . None   Social History Narrative  . None    No current facility-administered medications on file prior to encounter.    Current Outpatient Prescriptions on File Prior to Encounter  Medication Sig Dispense Refill  . albuterol (PROVENTIL HFA;VENTOLIN HFA) 108 (90 BASE) MCG/ACT inhaler Inhale 1 puff into the lungs every 6 (six) hours as needed for wheezing or shortness of breath.    Marland Kitchen. amLODipine (NORVASC) 5 MG tablet Take 5 mg by mouth daily.    . cetirizine (ZYRTEC) 10 MG tablet Take 10 mg by mouth daily.    . clopidogrel (PLAVIX) 75 MG tablet Take 75 mg by mouth daily.    Marland Kitchen. esomeprazole (NEXIUM) 20 MG capsule Take 20 mg by mouth daily at 12 noon.    . Fluticasone-Salmeterol (ADVAIR) 250-50 MCG/DOSE AEPB Inhale 1 puff into the lungs 2 (two) times daily.    . metoCLOPramide (REGLAN) 10 MG tablet Take 10 mg by mouth at bedtime.    . metoprolol tartrate (LOPRESSOR) 25 MG tablet Take 12.5 mg by mouth 2 (two) times daily.    . nitroGLYCERIN (NITROSTAT) 0.4 MG SL tablet Place 0.4 mg under the tongue every 5 (five) minutes as needed for chest pain.    . ranitidine (ZANTAC) 150 MG capsule Take 1 capsule (150 mg total) by mouth daily. (Patient not taking: Reported on 09/01/2016)  14 capsule 0  . simvastatin (ZOCOR) 40 MG tablet Take 40 mg by mouth daily.    Marland Kitchen topiramate (TOPAMAX) 25 MG tablet Take 25 mg by mouth daily.      Review of Systems: a complete, 10pt review of systems was completed with pertinent positives and negatives as documented in the HPI.   Physical Exam: Vitals:   01/09/17 0045 01/09/17 0200  BP: 141/71 114/64  Pulse: 81 95  Resp:    Temp:     Gen: A&Ox3, no distress Head: normocephalic, atraumatic, EOMI, anicteric.  Neck: supple without mass or thyromegaly Chest:  unlabored respirations, symmetrical air entry Cardiovascular: RRR, no pedal edema Abdomen: soft, mildly distended, partially reducible right paramedian and left parastomal hernias with minimal tenderness. llq ileostomy pink and budded with thin fluid in bag along with gas and a couple strands of thick output Extremities: warm, without edema, no deformities Neuro: grossly intact Psych: appropriate mood and affect Skin: no lesions or rashes on limited skin exam  CBC Latest Ref Rng & Units 01/08/2017 09/04/2016 09/03/2016  WBC 4.0 - 10.5 K/uL 12.9(H) 6.8 7.4  Hemoglobin 12.0 - 15.0 g/dL 11.4(L) 9.7(L) 10.4(L)  Hematocrit 36.0 - 46.0 % 36.1 30.9(L) 32.6(L)  Platelets 150 - 400 K/uL 235 213 213    CMP Latest Ref Rng & Units 01/08/2017 09/04/2016 09/03/2016  Glucose 65 - 99 mg/dL 161(W) 95 96(E)  BUN 6 - 20 mg/dL 11 15 18   Creatinine 0.44 - 1.00 mg/dL 4.54(U) 9.81(X) 9.14(N)  Sodium 135 - 145 mmol/L 133(L) 138 139  Potassium 3.5 - 5.1 mmol/L 4.7 3.5 3.9  Chloride 101 - 111 mmol/L 103 113(H) 114(H)  CO2 22 - 32 mmol/L 20(L) 21(L) 17(L)  Calcium 8.9 - 10.3 mg/dL 9.0 8.2(N) 7.8(L)  Total Protein 6.5 - 8.1 g/dL 7.8 - -  Total Bilirubin 0.3 - 1.2 mg/dL 0.8 - -  Alkaline Phos 38 - 126 U/L 123 - -  AST 15 - 41 U/L 33 - -  ALT 14 - 54 U/L 21 - -    Lab Results  Component Value Date   INR 1.27 09/02/2016   INR 1.59 (H) 11/08/2013   INR 1.31 09/17/2010    Imaging: CLINICAL DATA:  Abdominal pain and distension.  EXAM: CT ABDOMEN AND PELVIS WITH CONTRAST  TECHNIQUE: Multidetector CT imaging of the abdomen and pelvis was performed using the standard protocol following bolus administration of intravenous contrast.  CONTRAST:  80mL ISOVUE-300 IOPAMIDOL (ISOVUE-300) INJECTION 61%  COMPARISON:  CT 09/01/2016  FINDINGS: Lower chest: Previous nodular opacities at the left lung base are not visualized. There is minimal paramediastinal atelectasis.  Hepatobiliary: No focal hepatic  lesion. Postcholecystectomy with unchanged biliary dilatation, common bile duct measures 14 mm just distal to the porta hepatis. No calcified choledocholithiasis.  Pancreas: No ductal dilatation or inflammation.  Spleen: Normal in size without focal abnormality.  Adrenals/Urinary Tract: Normal adrenal glands. Symmetric renal enhancement and excretion on delayed phase imaging. Small cortical cyst in the posterior right kidney, unchanged. No hydronephrosis. Urinary bladder is physiologically distended.  Stomach/Bowel: Mild submucosal fatty infiltration of the greater curvature of stomach. Stomach is distended with ingested contents. 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 There is fecalization of the herniated contents with adjacent edema and free fluid in the hernia sac. The small bowel just proximal to the parastomal hernia is also dilated with fecalized contents, including small bowel within an adjacent right lower quadrant anterior abdominal wall hernia. There is mesenteric edema involving the abnormal  small bowel with minimal free fluid. The more proximal small bowel is normal. Post right hemicolectomy with decompressed distal colon. There is no pneumatosis or free air.  Vascular/Lymphatic: Abdominal aortic atherosclerosis without aneurysm. No adenopathy.  Reproductive: There is prominent periuterine vascularity and dilatation of the gonadal veins, left ovarian vein measures 6 mm.  Other: No free air.  Musculoskeletal: There are no acute or suspicious osseous abnormalities.  IMPRESSION: 1. 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 Bowel loops in the parastomal hernia as well as adjacent right ventral abdominal wall hernia appear obstructed and edematous. Point of obstruction appears within the hernia sac. 2. Additional stable chronic findings include postcholecystectomy biliary dilatation, submucosal fatty infiltration of the  stomach, and abdominal atherosclerosis. 3. The previous left lung base nodular opacities are not seen, and may have resolved.  A/P: 76yo Nepalese woman with complex surgical history and recurrent pSBO. CT similar to prior exams. -NPO, IVF, NG decompression -SBO protocol   Phylliss Blakes, MD [facility] Surgery, [place] Pager [phone]

## 2017-01-09 NOTE — Progress Notes (Signed)
PROGRESS NOTE    [name]  AVW:[id number] DOB: [birth year] DOA: 01/08/2017 PCP: Lonia Blood, MD   Brief Narrative: 76 y.o. female with medical history significant for asthma, coronary artery disease, hypertension, and remote history of duodenal perforation and partial colectomy, now presenting to the emergency department with severe abdominal pain adjacent to the ostomy site with marked decrease in ostomy output, nausea, and nonbloody nonbilious vomiting. CT of the abdomen and pelvis features a left lower quadrant ileostomy with peristomal hernia. On CT, the bowel loops in the parastomal hernia, as well as in the right ventral wall hernia appeared to be obstructed and edematous. Evaluated by general surgery.  This provider can speak patient's native language.  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 Currently patient is nothing by mouth. Continue IV fluid. -Patient reported that her nausea vomiting and abdominal pain is improving. She now has ileostomy output. -Change Protonix to IV -Change IV fluid with electrolytes and glucose.  #History of coronary artery disease: No chest pain. Currently on Plavix, statin. Holding Lopressor  #History of hypertension: Patient has low blood pressure this morning. I will hold Norvasc and Lopressor. Continue to monitor.  #Chronic kidney disease stage III: Serum creatinine level around baseline. Continue IV fluid. Continue to monitor. Avoid nephrotoxins.  #Normocytic anemia: No sign of bleeding. Continue to monitor CBC.  #Leukocytosis likely in the setting of stress. No fever. No sign of infection. Continue to monitor closely.  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 Reported feeling much better this morning. Denied nausea vomiting or abdominal pain. Started noticing ileostomy output. Currently nothing by mouth. Patient's current bedside. No chest pain or shortness of breath.  Objective: Vitals:   01/09/17 1119 01/09/17 1122 01/09/17 1130 01/09/17 1249  BP: (!) 80/40 108/55 102/64 (!) 90/58  Pulse:  77 61 68  Resp:  22 16 18   Temp:    98.9 F (37.2 C)  TempSrc:      SpO2:  96% 97% 97%    Intake/Output Summary (Last 24 hours) at 01/09/17 1314 Last data filed at 01/09/17 1126  Gross per 24 hour  Intake             1500 ml  Output              450 ml  Net             1050 ml   There were no vitals filed for this visit.  Examination:  General exam: Elderly female lying on bed comfortable, not in distress Respiratory system: Clear to auscultation. Respiratory effort normal. No wheezing or crackle Cardiovascular system: S1 & S2 heard, RRR.  No pedal edema. Gastrointestinal system: Abdomen soft, nondistended. Ileostomy site and bag has brown colored stool Central nervous system: Alert awake and following commands Skin: No rashes, lesions or ulcers Psychiatry: Judgement and insight appear normal. Mood & affect appropriate.     Data  Reviewed: I have personally reviewed following labs and imaging studies  CBC:  Recent Labs Lab 01/08/17 2001  WBC 12.9*  HGB 11.4*  HCT 36.1  MCV 84.5  PLT 235   Basic Metabolic Panel:  Recent Labs Lab 01/08/17 2001  NA 133*  K 4.7  CL 103  CO2 20*  GLUCOSE 143*  BUN 11  CREATININE 1.32*  CALCIUM 9.0   GFR: CrCl cannot be  calculated (Unknown ideal weight.). Liver Function Tests:  Recent Labs Lab 01/08/17 2001  AST 33  ALT 21  ALKPHOS 123  BILITOT 0.8  PROT 7.8  ALBUMIN 3.9    Recent Labs Lab 01/08/17 2001  LIPASE 32   No results for input(s): AMMONIA in the last 168 hours. Coagulation Profile: No results for input(s): INR, PROTIME in the last 168 hours. Cardiac Enzymes: No results for input(s): CKTOTAL, CKMB, CKMBINDEX, TROPONINI in the last 168 hours. BNP (last 3 results) No results for input(s): PROBNP in the last 8760 hours. HbA1C: No results for input(s): HGBA1C in the last 72 hours. CBG: No results for input(s): GLUCAP in the last 168 hours. Lipid Profile: No results for input(s): CHOL, HDL, LDLCALC, TRIG, CHOLHDL, LDLDIRECT in the last 72 hours. Thyroid Function Tests: No results for input(s): TSH, T4TOTAL, FREET4, T3FREE, THYROIDAB in the last 72 hours. Anemia Panel: No results for input(s): VITAMINB12, FOLATE, FERRITIN, TIBC, IRON, RETICCTPCT in the last 72 hours. Sepsis Labs:  Recent Labs Lab 01/09/17 0303  LATICACIDVEN 1.7    No results found for this or any previous visit (from the past 240 hour(s)).       Radiology Studies: Ct Abdomen Pelvis W Contrast  Result Date: 01/09/2017 CLINICAL DATA:  Abdominal pain and distension. EXAM: CT ABDOMEN AND PELVIS WITH CONTRAST TECHNIQUE: Multidetector CT imaging of the abdomen and pelvis was performed using the standard protocol following bolus administration of intravenous contrast. CONTRAST:  80mL ISOVUE-300 IOPAMIDOL (ISOVUE-300) INJECTION 61% COMPARISON:  CT 09/01/2016 FINDINGS: Lower chest: Previous nodular opacities at the left lung base are not visualized. There is minimal paramediastinal atelectasis. Hepatobiliary: No focal hepatic lesion. Postcholecystectomy with unchanged biliary dilatation, common bile duct measures 14 mm just distal to the porta hepatis. No calcified choledocholithiasis. Pancreas: No ductal dilatation or  inflammation. Spleen: Normal in size without focal abnormality. Adrenals/Urinary Tract: Normal adrenal glands. Symmetric renal enhancement and excretion on delayed phase imaging. Small cortical cyst in the posterior right kidney, unchanged. No hydronephrosis. Urinary bladder is physiologically distended. Stomach/Bowel: Mild submucosal fatty infiltration of the greater curvature of stomach. Stomach is distended with ingested contents. 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 There is fecalization of the herniated contents with adjacent edema and free fluid in the hernia sac. The small bowel just proximal to the parastomal hernia is also dilated with fecalized contents, including small bowel within an adjacent right lower quadrant anterior abdominal wall hernia. There is mesenteric edema involving the abnormal small bowel with minimal free fluid. The more proximal small bowel is normal. Post right hemicolectomy with decompressed distal colon. There is no pneumatosis or free air. Vascular/Lymphatic: Abdominal aortic atherosclerosis without aneurysm. No adenopathy. Reproductive: There is prominent periuterine vascularity and dilatation of the gonadal veins, left ovarian vein measures 6 mm. Other: No free air. Musculoskeletal: There are no acute or suspicious osseous abnormalities. IMPRESSION: 1. 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 Bowel loops in the parastomal hernia as well as adjacent right ventral abdominal wall hernia appear obstructed and edematous. Point of obstruction appears within the  hernia sac. 2. Additional stable chronic findings include postcholecystectomy biliary dilatation, submucosal fatty infiltration of the stomach, and abdominal atherosclerosis. 3. The previous left lung base nodular opacities are not seen, and may have resolved. Electronically Signed   By: Rubye Oaks M.D.   On: 01/09/2017 01:41        Scheduled Meds: . [START ON 01/10/2017] clopidogrel  75  mg Oral Daily  . diatrizoate meglumine-sodium  90 mL Per NG tube Once  . enoxaparin (LOVENOX) injection  30 mg Subcutaneous Daily  . [START ON 01/10/2017] loratadine  10 mg Oral Daily  . metoCLOPramide  10 mg Oral QHS  . mometasone-formoterol  2 puff Inhalation BID  . [START ON 01/10/2017] pantoprazole  40 mg Oral Daily  . [START ON 01/10/2017] simvastatin  20 mg Oral Daily  . [START ON 01/10/2017] topiramate  25 mg Oral Daily   Continuous Infusions: . sodium chloride 85 mL/hr at 01/09/17 0736     LOS: 0 days    Dron Jaynie Collins, MD [facility] Pager [phone]  If 7PM-7AM, please contact night-coverage [email or web address] Password [email or web address] 01/09/2017, 1:14 PM

## 2017-01-09 NOTE — ED Notes (Signed)
D/t patients resolution of s/s pt does not need NG tube at this time

## 2017-01-10 ENCOUNTER — Inpatient Hospital Stay (HOSPITAL_COMMUNITY): Payer: Medicaid Other

## 2017-01-10 LAB — RENAL FUNCTION PANEL
ALBUMIN: 2.8 g/dL — AB (ref 3.5–5.0)
Anion gap: 6 (ref 5–15)
BUN: 11 mg/dL (ref 6–20)
CO2: 23 mmol/L (ref 22–32)
CREATININE: 1.35 mg/dL — AB (ref 0.44–1.00)
Calcium: 8.1 mg/dL — ABNORMAL LOW (ref 8.9–10.3)
Chloride: 110 mmol/L (ref 101–111)
GFR calc Af Amer: 44 mL/min — ABNORMAL LOW (ref >60)
GFR, EST NON AFRICAN AMERICAN: 38 mL/min — AB (ref >60)
GLUCOSE: 100 mg/dL — AB (ref 65–99)
PHOSPHORUS: 3 mg/dL (ref 2.5–4.6)
Potassium: 4.1 mmol/L (ref 3.5–5.1)
Sodium: 139 mmol/L (ref 135–145)

## 2017-01-10 LAB — GLUCOSE, CAPILLARY
GLUCOSE-CAPILLARY: 104 mg/dL — AB (ref 65–99)
Glucose-Capillary: 101 mg/dL — ABNORMAL HIGH (ref 65–99)

## 2017-01-10 LAB — CBC
HCT: 31.5 % — ABNORMAL LOW (ref 36.0–46.0)
Hemoglobin: 9.8 g/dL — ABNORMAL LOW (ref 12.0–15.0)
MCH: 26.7 pg (ref 26.0–34.0)
MCHC: 31.1 g/dL (ref 30.0–36.0)
MCV: 85.8 fL (ref 78.0–100.0)
PLATELETS: 195 10*3/uL (ref 150–400)
RBC: 3.67 MIL/uL — ABNORMAL LOW (ref 3.87–5.11)
RDW: 14 % (ref 11.5–15.5)
WBC: 4.5 10*3/uL (ref 4.0–10.5)

## 2017-01-10 NOTE — Progress Notes (Signed)
General Surgery [facility]  Assessment & Plan:  Recurrent partial SBO  Improvement overnight with some output from ileostomy, less pain  Will begin clear liquid diet today and monitor  Encouraged OOB, ambulation  Continue IVF        Velora Heckler, MD, [facility]       Office: [phone]    Subjective: Patient in bed, family at bedside.  Hospitalist present and speaks [place].  Mild pain LLQ.  Objective: Vital signs in last 24 hours: Temp:  [98.2 F (36.8 C)-99.1 F (37.3 C)] 98.2 F (36.8 C) (03/04 0753) Pulse Rate:  [57-77] 59 (03/04 0753) Resp:  [16-22] 16 (03/04 0753) BP: (80-136)/(40-64) 136/63 (03/04 0753) SpO2:  [94 %-99 %] 97 % (03/04 0753) Weight:  [47.9 kg (105 lb 9.6 oz)] 47.9 kg (105 lb 9.6 oz) (03/04 0753) Last BM Date:  (prior to arrival)  Intake/Output from previous day: 03/03 0701 - 03/04 0700 In: 1828.3 [I.V.:1828.3] Out: 450 [Urine:350; Stool:100] Intake/Output this shift: No intake/output data recorded.  Physical Exam: HEENT - sclerae clear, mucous membranes moist Neck - soft Chest - clear bilaterally Cor - RRR Abdomen - soft without distension; active BS present; small liquid in ostomy; ostomy viable LLQ Neuro - alert & oriented, no focal deficits  Lab Results:   Recent Labs  01/08/17 2001 01/10/17 0243  WBC 12.9* 4.5  HGB 11.4* 9.8*  HCT 36.1 31.5*  PLT 235 195   BMET  Recent Labs  01/08/17 2001 01/10/17 0243  NA 133* 139  K 4.7 4.1  CL 103 110  CO2 20* 23  GLUCOSE 143* 100*  BUN 11 11  CREATININE 1.32* 1.35*  CALCIUM 9.0 8.1*   PT/INR No results for input(s): LABPROT, INR in the last 72 hours. Comprehensive Metabolic Panel:    Component Value Date/Time   NA 139 01/10/2017 0243   NA 133 (L) 01/08/2017 2001   K 4.1 01/10/2017 0243   K 4.7 01/08/2017 2001   CL 110 01/10/2017 0243   CL 103 01/08/2017 2001   CO2 23 01/10/2017 0243   CO2 20 (L) 01/08/2017 2001    BUN 11 01/10/2017 0243   BUN 11 01/08/2017 2001   CREATININE 1.35 (H) 01/10/2017 0243   CREATININE 1.32 (H) 01/08/2017 2001   GLUCOSE 100 (H) 01/10/2017 0243   GLUCOSE 143 (H) 01/08/2017 2001   CALCIUM 8.1 (L) 01/10/2017 0243   CALCIUM 9.0 01/08/2017 2001   AST 33 01/08/2017 2001   AST 30 09/01/2016 0529   ALT 21 01/08/2017 2001   ALT 20 09/01/2016 0529   ALKPHOS 123 01/08/2017 2001   ALKPHOS 133 (H) 09/01/2016 0529   BILITOT 0.8 01/08/2017 2001   BILITOT 0.9 09/01/2016 0529   PROT 7.8 01/08/2017 2001   PROT 7.4 09/01/2016 0529   ALBUMIN 2.8 (L) 01/10/2017 0243   ALBUMIN 3.9 01/08/2017 2001    Studies/Results: Ct Abdomen Pelvis W Contrast  Result Date: 01/09/2017 CLINICAL DATA:  Abdominal pain and distension. EXAM: CT ABDOMEN AND PELVIS WITH CONTRAST TECHNIQUE: Multidetector CT imaging of the abdomen and pelvis was performed using the standard protocol following bolus administration of intravenous contrast. CONTRAST:  80mL ISOVUE-300 IOPAMIDOL (ISOVUE-300) INJECTION 61% COMPARISON:  CT 09/01/2016 FINDINGS: Lower chest: Previous nodular opacities at the left lung base are not visualized. There is minimal paramediastinal atelectasis. Hepatobiliary: No focal hepatic lesion. Postcholecystectomy with unchanged biliary dilatation, common bile duct measures 14 mm just distal  to the porta hepatis. No calcified choledocholithiasis. Pancreas: No ductal dilatation or inflammation. Spleen: Normal in size without focal abnormality. Adrenals/Urinary Tract: Normal adrenal glands. Symmetric renal enhancement and excretion on delayed phase imaging. Small cortical cyst in the posterior right kidney, unchanged. No hydronephrosis. Urinary bladder is physiologically distended. Stomach/Bowel: Mild submucosal fatty infiltration of the greater curvature of stomach. Stomach is distended with ingested contents. 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 There is fecalization of the herniated contents  with adjacent edema and free fluid in the hernia sac. The small bowel just proximal to the parastomal hernia is also dilated with fecalized contents, including small bowel within an adjacent right lower quadrant anterior abdominal wall hernia. There is mesenteric edema involving the abnormal small bowel with minimal free fluid. The more proximal small bowel is normal. Post right hemicolectomy with decompressed distal colon. There is no pneumatosis or free air. Vascular/Lymphatic: Abdominal aortic atherosclerosis without aneurysm. No adenopathy. Reproductive: There is prominent periuterine vascularity and dilatation of the gonadal veins, left ovarian vein measures 6 mm. Other: No free air. Musculoskeletal: There are no acute or suspicious osseous abnormalities. IMPRESSION: 1. 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 Bowel loops in the parastomal hernia as well as adjacent right ventral abdominal wall hernia appear obstructed and edematous. Point of obstruction appears within the hernia sac. 2. Additional stable chronic findings include postcholecystectomy biliary dilatation, submucosal fatty infiltration of the stomach, and abdominal atherosclerosis. 3. The previous left lung base nodular opacities are not seen, and may have resolved. Electronically Signed   By: Rubye OaksMelanie  Ehinger M.D.   On: 01/09/2017 01:41      Kollins Fenter M 01/10/2017  Patient ID: [name], female   DOB: 03/09/1941, 76 y.o.   MRN: [id number]

## 2017-01-10 NOTE — Progress Notes (Signed)
PROGRESS NOTE    [name]  ZOX:[id number] DOB: Feb 01, 1941 DOA: 01/08/2017 PCP: Lonia Blood, MD   Brief Narrative: 76 y.o. female with medical history significant for asthma, coronary artery disease, hypertension, and remote history of duodenal perforation and partial colectomy, now presenting to the emergency department with severe abdominal pain adjacent to the ostomy site with marked decrease in ostomy output, nausea, and nonbloody nonbilious vomiting. CT of the abdomen and pelvis features a left lower quadrant ileostomy with peristomal hernia. On CT, the bowel loops in the parastomal hernia, as well as in the right ventral wall hernia appeared to be obstructed and edematous. Evaluated by general surgery.  This provider can speak patient's native language.  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 Denied nausea vomiting but he still has some abdominal discomfort. Patient has good ileostomy output. I discussed with the surgeon Dr. Gerrit Friends today. Plan to start clear liquid diet and gradually advance as tolerated. -Change Protonix to IV -Continue IV fluid  #History of coronary artery disease: No chest pain. Currently on Plavix, statin.   #History of hypertension: Blood pressure acceptable. Norvasc and Lopressor on hold.   #Chronic kidney disease stage III: Serum creatinine level around baseline. Continue IV fluid. Continue to monitor. Avoid nephrotoxins. Monitor lab.  #Normocytic anemia: No sign of bleeding. Repeat CBC in the morning  #Leukocytosis likely in the setting of stress. No fever. No sign of infection. Leukocytosis improved.  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 Reported feeling better. No nausea vomiting, but is still has minimal abdominal discomfort. Denied chest pain or shortness of breath. Objective: Vitals:   01/09/17 1249 01/09/17 1700 01/09/17 2241 01/10/17 0753  BP: (!) 90/58 (!) 101/41 (!) 100/46 136/63  Pulse: 68  (!) 57 (!) 59  Resp: 18  16 16   Temp: 98.9 F (37.2 C)  98.6 F (37 C) 98.2 F (36.8 C)  TempSrc:   Oral Oral  SpO2: 97%  94% 97%  Weight:    47.9 kg (105 lb 9.6 oz)    Intake/Output Summary (Last 24 hours) at 01/10/17 1141 Last data filed at 01/10/17 0336  Gross per 24 hour  Intake          1328.33 ml  Output                0 ml  Net          1328.33 ml   Filed Weights   01/10/17 0753  Weight: 47.9 kg (105 lb 9.6 oz)    Examination:  General exam: Not in distress Respiratory system: Clear to auscultation. Respiratory effort normal. No wheezing or crackle Cardiovascular system: S1 & S2 heard, RRR.  No pedal edema. Gastrointestinal system: Abdomen soft, nontender, nondistended. Bowel sound positive. Ileostomy site and bag has normal color stool. Central nervous system: Alert awake and following commands Skin: No rashes, lesions or ulcers Psychiatry: Judgement and insight appear normal. Mood &  affect appropriate.     Data Reviewed: I have personally reviewed following labs and imaging studies  CBC:  Recent Labs Lab 01/08/17 2001 01/10/17 0243  WBC 12.9* 4.5  HGB 11.4* 9.8*  HCT 36.1 31.5*  MCV 84.5 85.8  PLT 235 195   Basic Metabolic Panel:  Recent Labs Lab 01/08/17 2001 01/10/17 0243  NA 133* 139  K 4.7 4.1  CL 103 110  CO2 20* 23  GLUCOSE 143*  100*  BUN 11 11  CREATININE 1.32* 1.35*  CALCIUM 9.0 8.1*  PHOS  --  3.0   GFR: Estimated Creatinine Clearance: 22 mL/min (by C-G formula based on SCr of 1.35 mg/dL (H)). Liver Function Tests:  Recent Labs Lab 01/08/17 2001 01/10/17 0243  AST 33  --   ALT 21  --   ALKPHOS 123  --   BILITOT 0.8  --   PROT 7.8  --   ALBUMIN 3.9 2.8*    Recent Labs Lab 01/08/17 2001  LIPASE 32   No results for input(s): AMMONIA in the last 168 hours. Coagulation Profile: No results for input(s): INR, PROTIME in the last 168 hours. Cardiac Enzymes: No results for input(s): CKTOTAL, CKMB, CKMBINDEX, TROPONINI in the last 168 hours. BNP (last 3 results) No results for input(s): PROBNP in the last 8760 hours. HbA1C: No results for input(s): HGBA1C in the last 72 hours. CBG:  Recent Labs Lab 01/09/17 2240 01/10/17 0713 01/10/17 1121  GLUCAP 92 104* 101*   Lipid Profile: No results for input(s): CHOL, HDL, LDLCALC, TRIG, CHOLHDL, LDLDIRECT in the last 72 hours. Thyroid Function Tests: No results for input(s): TSH, T4TOTAL, FREET4, T3FREE, THYROIDAB in the last 72 hours. Anemia Panel: No results for input(s): VITAMINB12, FOLATE, FERRITIN, TIBC, IRON, RETICCTPCT in the last 72 hours. Sepsis Labs:  Recent Labs Lab 01/09/17 0303  LATICACIDVEN 1.7    No results found for this or any previous visit (from the past 240 hour(s)).       Radiology Studies: Ct Abdomen Pelvis W Contrast  Result Date: 01/09/2017 CLINICAL DATA:  Abdominal pain and distension. EXAM: CT ABDOMEN AND PELVIS WITH CONTRAST TECHNIQUE: Multidetector CT imaging of the abdomen and pelvis was performed using the standard protocol following bolus administration of intravenous contrast. CONTRAST:  80mL ISOVUE-300 IOPAMIDOL (ISOVUE-300) INJECTION 61% COMPARISON:  CT 09/01/2016 FINDINGS: Lower chest: Previous nodular opacities at the left lung base are not visualized. There is minimal paramediastinal atelectasis.  Hepatobiliary: No focal hepatic lesion. Postcholecystectomy with unchanged biliary dilatation, common bile duct measures 14 mm just distal to the porta hepatis. No calcified choledocholithiasis. Pancreas: No ductal dilatation or inflammation. Spleen: Normal in size without focal abnormality. Adrenals/Urinary Tract: Normal adrenal glands. Symmetric renal enhancement and excretion on delayed phase imaging. Small cortical cyst in the posterior right kidney, unchanged. No hydronephrosis. Urinary bladder is physiologically distended. Stomach/Bowel: Mild submucosal fatty infiltration of the greater curvature of stomach. Stomach is distended with ingested contents. 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 There is fecalization of the herniated contents with adjacent edema and free fluid in the hernia sac. The small bowel just proximal to the parastomal hernia is also dilated with fecalized contents, including small bowel within an adjacent right lower quadrant anterior abdominal wall hernia. There is mesenteric edema involving the abnormal small bowel with minimal free fluid. The more proximal small bowel is normal. Post right hemicolectomy with decompressed distal colon. There is no pneumatosis or free air. Vascular/Lymphatic: Abdominal aortic atherosclerosis without aneurysm. No adenopathy. Reproductive: There  is prominent periuterine vascularity and dilatation of the gonadal veins, left ovarian vein measures 6 mm. Other: No free air. Musculoskeletal: There are no acute or suspicious osseous abnormalities. IMPRESSION: 1. 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 Bowel loops in the parastomal hernia as well as adjacent right ventral abdominal wall hernia appear obstructed and edematous. Point of obstruction appears within the hernia sac. 2. Additional stable chronic findings include postcholecystectomy biliary dilatation, submucosal fatty infiltration of the stomach, and abdominal atherosclerosis.  3. The previous left lung base nodular opacities are not seen, and may have resolved. Electronically Signed   By: Rubye OaksMelanie  Ehinger M.D.   On: 01/09/2017 01:41        Scheduled Meds: . clopidogrel  75 mg Oral Daily  . diatrizoate meglumine-sodium  90 mL Per NG tube Once  . enoxaparin (LOVENOX) injection  30 mg Subcutaneous Daily  . loratadine  10 mg Oral Daily  . metoCLOPramide  10 mg Oral QHS  . mometasone-formoterol  2 puff Inhalation BID  . pantoprazole (PROTONIX) IV  40 mg Intravenous Q24H  . simvastatin  20 mg Oral Daily  . topiramate  25 mg Oral Daily   Continuous Infusions: . dextrose 5 % and 0.9% NaCl 100 mL/hr (01/09/17 2306)     LOS: 1 day    Kathline Banbury Jaynie CollinsPrasad Mindi Akerson, MD [facility] Pager [phone]  If 7PM-7AM, please contact night-coverage [email or web address] Password [email or web address] 01/10/2017, 11:41 AM

## 2017-01-11 LAB — CBC
HEMATOCRIT: 32.9 % — AB (ref 36.0–46.0)
Hemoglobin: 10 g/dL — ABNORMAL LOW (ref 12.0–15.0)
MCH: 26.1 pg (ref 26.0–34.0)
MCHC: 30.4 g/dL (ref 30.0–36.0)
MCV: 85.9 fL (ref 78.0–100.0)
PLATELETS: 191 10*3/uL (ref 150–400)
RBC: 3.83 MIL/uL — AB (ref 3.87–5.11)
RDW: 13.8 % (ref 11.5–15.5)
WBC: 7.5 10*3/uL (ref 4.0–10.5)

## 2017-01-11 LAB — RENAL FUNCTION PANEL
ALBUMIN: 3 g/dL — AB (ref 3.5–5.0)
Anion gap: 3 — ABNORMAL LOW (ref 5–15)
BUN: 6 mg/dL (ref 6–20)
CHLORIDE: 116 mmol/L — AB (ref 101–111)
CO2: 21 mmol/L — ABNORMAL LOW (ref 22–32)
CREATININE: 1.27 mg/dL — AB (ref 0.44–1.00)
Calcium: 8 mg/dL — ABNORMAL LOW (ref 8.9–10.3)
GFR, EST AFRICAN AMERICAN: 47 mL/min — AB (ref >60)
GFR, EST NON AFRICAN AMERICAN: 41 mL/min — AB (ref >60)
Glucose, Bld: 108 mg/dL — ABNORMAL HIGH (ref 65–99)
PHOSPHORUS: 2.6 mg/dL (ref 2.5–4.6)
POTASSIUM: 3.9 mmol/L (ref 3.5–5.1)
Sodium: 140 mmol/L (ref 135–145)

## 2017-01-11 LAB — GLUCOSE, CAPILLARY: GLUCOSE-CAPILLARY: 121 mg/dL — AB (ref 65–99)

## 2017-01-11 NOTE — Progress Notes (Signed)
[facility] Progress Note     Subjective: Pt states she is tolerating her diet. No nausea or vomiting. Dr. Ronalee BeltsBhandari was present and able to translate. I appreciate his assistance.   Objective: Vital signs in last 24 hours: Temp:  [98.1 F (36.7 C)-99.6 F (37.6 C)] 99.6 F (37.6 C) (03/05 0715) Pulse Rate:  [64-70] 70 (03/05 0715) Resp:  [15-16] 16 (03/05 0715) BP: (106-141)/(48-61) 115/51 (03/05 0715) SpO2:  [95 %-99 %] 96 % (03/05 0715) Weight:  [105 lb 9.6 oz (47.9 kg)] 105 lb 9.6 oz (47.9 kg) (03/05 0715) Last BM Date:  (colostomy)  Intake/Output from previous day: 03/04 0701 - 03/05 0700 In: 240 [P.O.:240] Out: -  Intake/Output this shift: No intake/output data recorded.  PE: Gen:  Alert, NAD, pleasant, cooperative, well appearing Card:  RRR, no M/G/R heard Pulm:  Rate and effort normal Abd: Soft, mildly distended, +BS, liquid stool and small amt of gas noted in ostomy bag, right paramedian hernia reducible with mild tenderness, stoma pink  Skin: no rashes noted, warm and dry  Lab Results:   Recent Labs  01/10/17 0243 01/11/17 0458  WBC 4.5 7.5  HGB 9.8* 10.0*  HCT 31.5* 32.9*  PLT 195 191   BMET  Recent Labs  01/10/17 0243 01/11/17 0458  NA 139 140  K 4.1 3.9  CL 110 116*  CO2 23 21*  GLUCOSE 100* 108*  BUN 11 6  CREATININE 1.35* 1.27*  CALCIUM 8.1* 8.0*   PT/INR No results for input(s): LABPROT, INR in the last 72 hours. CMP     Component Value Date/Time   NA 140 01/11/2017 0458   K 3.9 01/11/2017 0458   CL 116 (H) 01/11/2017 0458   CO2 21 (L) 01/11/2017 0458   GLUCOSE 108 (H) 01/11/2017 0458   BUN 6 01/11/2017 0458   CREATININE 1.27 (H) 01/11/2017 0458   CALCIUM 8.0 (L) 01/11/2017 0458   PROT 7.8 01/08/2017 2001   ALBUMIN 3.0 (L) 01/11/2017 0458   AST 33 01/08/2017 2001   ALT 21 01/08/2017 2001   ALKPHOS 123 01/08/2017 2001   BILITOT 0.8 01/08/2017 2001   GFRNONAA 41 (L) 01/11/2017 0458   GFRAA 47 (L) 01/11/2017 0458    Lipase     Component Value Date/Time   LIPASE 32 01/08/2017 2001       Studies/Results: No results found.  Anti-infectives: Anti-infectives    None       Assessment/Plan  CAD HTN Normocytic anemia CKD stage III Hx of SBO last one 08/2016  Recurrent partial SBO, parastomal hernia  - improvement with some ileostomy output, less pain - tolerated clears, advance diet as tolerated - encourage OOB, ambulation - continue IVF - we will continue to follow   LOS: 2 days    Jerre SimonJessica L Riyana Biel , [facility] 01/11/2017, 8:38 AM Pager: [phone] Consults: [phone] Mon-Fri 7:00 am-4:30 pm Sat-Sun 7:00 am-11:30 am

## 2017-01-11 NOTE — Discharge Summary (Addendum)
Physician Discharge Summary  Regina BalGanga M Vayda BMW:[id number] DOB: 1940-11-12 DOA: 01/08/2017  PCP: Lonia BloodGARBA,LAWAL, MD  Admit date: 01/08/2017 Discharge date: 01/11/2017  Admitted From:home Disposition:home  Recommendations for Outpatient Follow-up:  1. Follow up with PCPand General Surgery  in 1-2 weeks 2. Please obtain BMP/CBC in one week  Home Health:no Equipment/Devices:no Discharge Condition:stable CODE STATUS:full Diet recommendation:soft/heart healthy diet This provider can speak pt's native language (nepali). Brief/Interim Summary:76 y.o.femalewith medical history significant forasthma, coronary artery disease, hypertension, and remote history of duodenal perforation and partial colectomy, now presenting to the emergency department with severe abdominal pain adjacent to the ostomy site with marked decrease in ostomy output, nausea, and nonbloody nonbilious vomiting. CT of the abdomen and pelvis features a left lower quadrant ileostomy with peristomal hernia. On CT, the bowel loops in the parastomal hernia, as well as in the right ventral wall hernia appeared to be obstructed and edematous. Evaluated by general surgery.  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 Patient was observed with conservative management. She is clinically improving. Denies nausea vomiting or abdominal pain. Having good output from the ileostomy. Discussed with the surgery today. Okay to discharge with outpatient follow-up. -I discussed with the patient and her daughter-in-law over the phone to recommend to follow-up with surgery as an outpatient. They verbalized understanding.  #History of coronary artery disease: No chest pain. Continue home medication. Apparently patient was not taking Plavix at home.   #History of hypertension:  Continue Lopressor. Holding Norvasc and lisinopril as patient's blood pressures  were low and was not receiving antihypertensive medication in the hospital. I recommended patient to follow-up with PCP.  #Chronic kidney disease stage III: Serum creatinine level around baseline. Recommended to monitor labs with PCP.  #Normocytic anemia: No sign of bleeding. Hemoglobin is stable.  #Leukocytosis likely in the setting of stress. No fever. No sign of infection. Leukocytosis improved.  Patient is clinically improving. Able to go home today. Denies nausea vomiting or abdominal pain. Has good output from the ileostomy. Seen by surgeon. I think patient is medically stable to discharge home with outpatient follow-up. I discussed discharge planning with the general surgery and with patient's daughter-in-law. Home medications are adjusted on discharge after reviewed by the pharmacist. Patient was not taking most of the medications at home. 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)    Discharge Instructions  Discharge Instructions    Call MD for:  difficulty breathing, headache or visual disturbances    Complete by:  As directed    Call MD for:  hives    Complete by:  As directed    Call MD for:  persistant dizziness or light-headedness    Complete by:  As directed    Call MD for:  persistant nausea and vomiting    Complete by:  As directed    Call MD for:  severe uncontrolled pain    Complete by:  As directed    Call MD for:  temperature >100.4    Complete by:  As directed    Diet - low sodium heart healthy    Complete by:  As directed    Please take soft diet and advance as tolerated.   Discharge  instructions    Complete by:  As directed    Please follow up with PCP and Surgery physician in 1-2 weeks.  Please take soft diet and  advance as tolerated.   Increase activity slowly    Complete by:  As directed      Allergies as of 01/11/2017   No Known Allergies     Medication List    STOP taking these medications   amLODipine 5 MG tablet Commonly known as:  NORVASC   enalapril 5 MG tablet Commonly known as:  VASOTEC   ranitidine 150 MG capsule Commonly known as:  ZANTAC     TAKE these medications   albuterol 108 (90 Base) MCG/ACT inhaler Commonly known as:  PROVENTIL HFA;VENTOLIN HFA Inhale 1 puff into the lungs every 6 (six) hours as needed for wheezing or shortness of breath.   albuterol (2.5 MG/3ML) 0.083% nebulizer solution Commonly known as:  PROVENTIL Take 2.5 mg by nebulization 2 (two) times daily.   esomeprazole 20 MG capsule Commonly known as:  NEXIUM Take 20 mg by mouth daily.   Fluticasone-Salmeterol 250-50 MCG/DOSE Aepb Commonly known as:  ADVAIR Inhale 1 puff into the lungs 2 (two) times daily.   metoprolol tartrate 25 MG tablet Commonly known as:  LOPRESSOR Take 12.5 mg by mouth 2 (two) times daily.   nitroGLYCERIN 0.4 MG SL tablet Commonly known as:  NITROSTAT Place 0.4 mg under the tongue every 5 (five) minutes as needed for chest pain.   simvastatin 20 MG tablet Commonly known as:  ZOCOR Take 20 mg by mouth daily.      Follow-up Information    GARBA,LAWAL, MD. Schedule an appointment as soon as possible for a visit in 1 week(s).   Specialty:  Internal Medicine Contact information: [address] [place] [address] [phone]        [facility]. Schedule an appointment as soon as possible for a visit in 2 week(s).   Specialty:  General Surgery Contact information: [address] [place] [address] [phone]          No Known Allergies  Consultations: General surgery  Procedures/Studies: None  Subjective: Patient was seen and examined at bedside. Patient was sitting on chair comfortable. Denies nausea vomiting or  abdominal pain. Denies chest pain or shortness of breath.  Discharge Exam: Vitals:   01/10/17 2139 01/11/17 0715  BP: (!) 141/61 (!) 115/51  Pulse: 68 70  Resp: 16 16  Temp: 98.1 F (36.7 C) 99.6 F (37.6 C)   Vitals:   01/10/17 1427 01/10/17 2139 01/11/17 0715 01/11/17 0958  BP: (!) 106/48 (!) 141/61 (!) 115/51   Pulse: 64 68 70   Resp: 15 16 16    Temp: 98.2 F (36.8 C) 98.1 F (36.7 C) 99.6 F (37.6 C)   TempSrc:  Oral Oral   SpO2: 99% 95% 96% 97%  Weight:   47.9 kg (105 lb 9.6 oz)     General: Pt is alert, awake, not in acute distress Cardiovascular: RRR, S1/S2 +, no rubs, no gallops Respiratory: CTA bilaterally, no wheezing, no rhonchi Abdominal: Soft, NT, ND, bowel sounds +. Ileostomy site clean with brown colored stool in the bag Extremities: no edema, no cyanosis    The results of significant diagnostics from this hospitalization (including imaging, microbiology, ancillary and laboratory) are listed below for reference.     Microbiology: No results found for this or any previous visit (from the past 240 hour(s)).   Labs: BNP (  last 3 results) No results for input(s): BNP in the last 8760 hours. Basic Metabolic Panel:  Recent Labs Lab 01/08/17 2001 01/10/17 0243 01/11/17 0458  NA 133* 139 140  K 4.7 4.1 3.9  CL 103 110 116*  CO2 20* 23 21*  GLUCOSE 143* 100* 108*  BUN 11 11 6   CREATININE 1.32* 1.35* 1.27*  CALCIUM 9.0 8.1* 8.0*  PHOS  --  3.0 2.6   Liver Function Tests:  Recent Labs Lab 01/08/17 2001 01/10/17 0243 01/11/17 0458  AST 33  --   --   ALT 21  --   --   ALKPHOS 123  --   --   BILITOT 0.8  --   --   PROT 7.8  --   --   ALBUMIN 3.9 2.8* 3.0*    Recent Labs Lab 01/08/17 2001  LIPASE 32   No results for input(s): AMMONIA in the last 168 hours. CBC:  Recent Labs Lab 01/08/17 2001 01/10/17 0243 01/11/17 0458  WBC 12.9* 4.5 7.5  HGB 11.4* 9.8* 10.0*  HCT 36.1 31.5* 32.9*  MCV 84.5 85.8 85.9  PLT 235 195 191    Cardiac Enzymes: No results for input(s): CKTOTAL, CKMB, CKMBINDEX, TROPONINI in the last 168 hours. BNP: Invalid input(s): POCBNP CBG:  Recent Labs Lab 01/09/17 2240 01/10/17 0713 01/10/17 1121 01/11/17 0714  GLUCAP 92 104* 101* 121*   D-Dimer No results for input(s): DDIMER in the last 72 hours. Hgb A1c No results for input(s): HGBA1C in the last 72 hours. Lipid Profile No results for input(s): CHOL, HDL, LDLCALC, TRIG, CHOLHDL, LDLDIRECT in the last 72 hours. Thyroid function studies No results for input(s): TSH, T4TOTAL, T3FREE, THYROIDAB in the last 72 hours.  Invalid input(s): FREET3 Anemia work up No results for input(s): VITAMINB12, FOLATE, FERRITIN, TIBC, IRON, RETICCTPCT in the last 72 hours. Urinalysis    Component Value Date/Time   COLORURINE YELLOW 01/08/2017 2007   APPEARANCEUR CLEAR 01/08/2017 2007   LABSPEC 1.012 01/08/2017 2007   PHURINE 5.0 01/08/2017 2007   GLUCOSEU NEGATIVE 01/08/2017 2007   HGBUR SMALL (A) 01/08/2017 2007   BILIRUBINUR NEGATIVE 01/08/2017 2007   KETONESUR NEGATIVE 01/08/2017 2007   PROTEINUR NEGATIVE 01/08/2017 2007   UROBILINOGEN 0.2 08/24/2014 2016   NITRITE NEGATIVE 01/08/2017 2007   LEUKOCYTESUR TRACE (A) 01/08/2017 2007   Sepsis Labs Invalid input(s): PROCALCITONIN,  WBC,  LACTICIDVEN Microbiology No results found for this or any previous visit (from the past 240 hour(s)).   Time coordinating discharge: 31 minutes  SIGNED:   Maxie Barb, MD  [facility] 01/11/2017, 11:08 AM  If 7PM-7AM, please contact night-coverage [email or web address] Password [email or web address]

## 2017-01-11 NOTE — Progress Notes (Signed)
Patient ready to be discharged from unit to home. Family in attendance. All discharge instructions reviewed with family as family interpreted. Patient in no distress and denies pain/discomfort. All personal belongings with patient. Follow-up appointments reviewed with patient and family.
